# Patient Record
Sex: Male | Born: 1950
Health system: Southern US, Community
[De-identification: ages and names within clinical notes are randomized; demographics above are authoritative.]

## PROBLEM LIST (undated history)

## (undated) DIAGNOSIS — E785 Hyperlipidemia, unspecified: Secondary | ICD-10-CM

## (undated) DIAGNOSIS — T7840XA Allergy, unspecified, initial encounter: Secondary | ICD-10-CM

## (undated) DIAGNOSIS — I10 Essential (primary) hypertension: Secondary | ICD-10-CM

## (undated) DIAGNOSIS — C801 Malignant (primary) neoplasm, unspecified: Secondary | ICD-10-CM

## (undated) HISTORY — DX: Allergy, unspecified, initial encounter: T78.40XA

## (undated) HISTORY — DX: Hyperlipidemia, unspecified: E78.5

## (undated) HISTORY — DX: Malignant (primary) neoplasm, unspecified: C80.1

## (undated) HISTORY — PX: HERNIA REPAIR: SHX51

## (undated) HISTORY — DX: Essential (primary) hypertension: I10

## (undated) HISTORY — PX: TONSILLECTOMY: SUR1361

---
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
## 2009-03-12 DIAGNOSIS — C801 Malignant (primary) neoplasm, unspecified: Secondary | ICD-10-CM

## 2009-03-12 HISTORY — PX: MOHS SURGERY: SHX181

## 2009-03-12 HISTORY — DX: Malignant (primary) neoplasm, unspecified: C80.1

## 2012-07-21 ENCOUNTER — Ambulatory Visit (INDEPENDENT_AMBULATORY_CARE_PROVIDER_SITE_OTHER): Payer: Managed Care, Other (non HMO) | Admitting: Family

## 2012-07-21 ENCOUNTER — Encounter: Payer: Self-pay | Admitting: Family

## 2012-07-21 VITALS — BP 144/80 | HR 108 | Temp 97.9°F | Resp 16 | Ht 68.5 in | Wt 185.0 lb

## 2012-07-21 DIAGNOSIS — Z85820 Personal history of malignant melanoma of skin: Secondary | ICD-10-CM | POA: Insufficient documentation

## 2012-07-21 DIAGNOSIS — C439 Malignant melanoma of skin, unspecified: Secondary | ICD-10-CM

## 2012-07-21 DIAGNOSIS — B354 Tinea corporis: Secondary | ICD-10-CM

## 2012-07-21 MED ORDER — CLOTRIMAZOLE-BETAMETHASONE 1-0.05 % EX CREA: TOPICAL_CREAM | Freq: Two times a day (BID) | CUTANEOUS | Status: DC

## 2012-07-21 NOTE — Patient Instructions (Addendum)
You will be contacted about your referral to dermatology. Call if rash worsen of if not resolved in 2 weeks. Schedule a complete physical in 1 month. Come fasting to your visit.   Welcome to [facility]!

## 2012-07-21 NOTE — Assessment & Plan Note (Signed)
Trial of lotrisone cream.

## 2012-07-21 NOTE — Progress Notes (Signed)
  Subjective:    Patient ID: Jose Strong, male    DOB: [birth year], 62 y.o.   MRN: [id number]  HPI  Jose Strong is a 62 yr old male who presents today with chief complaint of dry, itchy patches on both feet x 2 months. Has history of ringworm in 2011. He has tried ?lamisil no significant improvement.  Was given ointment.  In the past.    Past medical history is significant for hx of melanoma in 2011- right neck. Was told "surface."   Moved from [place].     Review of Systems  Constitutional: Negative for unexpected weight change.  HENT: Negative for congestion.   Eyes: Negative for visual disturbance.  Respiratory: Negative for cough and shortness of breath.   Cardiovascular: Negative for chest pain.  Gastrointestinal: Negative for nausea, vomiting and diarrhea.  Genitourinary: Negative for frequency.  Musculoskeletal: Negative for myalgias and arthralgias.  Skin: Positive for rash.  Neurological: Negative for headaches.  Hematological: Negative for adenopathy.  Psychiatric/Behavioral:       Denies depression/anxiety   Past Medical History  Diagnosis Date  . Cancer 2011    melanoma --neck    History   Social History  . Marital Status: Married    Spouse Name: N/A    Number of Children: 0  . Years of Education: N/A   Occupational History  . Not on file.   Social History Main Topics  . Smoking status: Former Games developer  . Smokeless tobacco: Never Used  . Alcohol Use: 8.4 oz/week    14 Cans of beer per week  . Drug Use: Not on file  . Sexually Active: Not on file   Other Topics Concern  . Not on file   Social History Narrative   Married   Completed HS   Not working   Has worked in Therapist, 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      Past Surgical History  Procedure Laterality Date  . Mohs surgery  2011  . Tonsillectomy  1964    Family History  Problem Relation Age of Onset  . COPD Mother   . Angina  Mother     No Known Allergies  No current outpatient prescriptions on file prior to visit.   No current facility-administered medications on file prior to visit.    BP 144/80  Pulse 108  Temp(Src) 97.9 F (36.6 C) (Oral)  Resp 16  Ht 5' 8.5" (1.74 m)  Wt 185 lb (83.915 kg)  BMI 27.72 kg/m2  SpO2 98%        Objective:   Physical Exam  Constitutional: He is oriented to person, place, and time. He appears well-developed and well-nourished. No distress.  HENT:  Head: Normocephalic.  Cardiovascular: Normal rate and regular rhythm.   No murmur heard. Pulmonary/Chest: Effort normal and breath sounds normal. No respiratory distress. He has no wheezes. He has no rales. He exhibits no tenderness.  Musculoskeletal:  Annular, erythematous patches noted bilateral LE above ankles  Neurological: He is alert and oriented to person, place, and time.  Psychiatric: He has a normal mood and affect. His behavior is normal. Judgment and thought content normal.          Assessment & Plan:

## 2012-07-21 NOTE — Assessment & Plan Note (Signed)
Will refer to local derm for surveillance.

## 2012-07-23 ENCOUNTER — Ambulatory Visit: Payer: Self-pay | Admitting: Family

## 2012-08-05 ENCOUNTER — Telehealth: Payer: Self-pay | Admitting: *Deleted

## 2012-08-05 NOTE — Telephone Encounter (Signed)
Recommend benadryl, hydrocortisone.  If no improvement with these measures he should be seen in office please for visit.

## 2012-08-05 NOTE — Telephone Encounter (Signed)
Received message from pt that he worked in the yard over the weekend and thinks he may have poison oak / ivy on his hands and arms. Caladryl has no been helping. Wants to know if we will prescribe something to clear it up?  Please advise.

## 2012-08-06 NOTE — Telephone Encounter (Signed)
Notified pt re: instructions below and he voices understanding. Also advised pt of OTC (tec nu) spray for poison oak/ivy to try (ok per verbal from Provider).

## 2012-08-12 ENCOUNTER — Telehealth: Payer: Self-pay | Admitting: *Deleted

## 2012-08-12 ENCOUNTER — Encounter: Payer: Self-pay | Admitting: *Deleted

## 2012-08-12 ENCOUNTER — Emergency Department
Admission: EM | Admit: 2012-08-12 | Discharge: 2012-08-12 | Disposition: A | Discharge status: Home / Self Care | Payer: Managed Care, Other (non HMO) | Source: Home / Self Care | Attending: Family Medicine | Admitting: Family Medicine

## 2012-08-12 DIAGNOSIS — L255 Unspecified contact dermatitis due to plants, except food: Secondary | ICD-10-CM

## 2012-08-12 MED ORDER — TRIAMCINOLONE ACETONIDE 40 MG/ML IJ SUSP
40.0000 mg | Freq: Once | INTRAMUSCULAR | Status: AC
  Administered 2012-08-12: 40 mg via INTRAMUSCULAR

## 2012-08-12 MED ORDER — MUPIROCIN CALCIUM 2 % EX CREA: TOPICAL_CREAM | Freq: Three times a day (TID) | CUTANEOUS | Status: DC

## 2012-08-12 NOTE — Telephone Encounter (Signed)
  [facility] Triage Call Report Triage Record Num: [id number] Operator: Geanie Berlin Patient Name: Jose Strong Call Date & Time: 08/11/2012 5:26:38PM Patient Phone: [phone] PCP: Sandford Craze, NP Patient Gender: Male PCP Fax : [phone] Patient DOB: 1950/07/24 Practice Name: [facility] Reason for Call: Caller: Rena/Patient; PCP: Peggyann Juba, Efraim Kaufmann (Adults only); CB#: [phone]; Call regarding Rash/Hives; Reports spreading poison ivy that began on right hand, left arm and left ankle. Was burning weeds in yard and doing yard work. Rash has spread to buttocks and right groin. Tried oatmeal paste, Chlorax bleach, Nutec cream with some improvement to rash on right hand. Reported redness and warmth of rash. Advised to see [facility] within 4 hours for signs of secondary infection per Poison [place], [place] or [place] Exposure guideline. Stated would go to UC in the morning 08/12/12. Protocol(s) Used: Poison Ivy, [place], or [facility] Exposure Recommended Outcome per Protocol: See Provider within 4 hours Reason for Outcome: Signs of secondary infection Care Advice: Apply cool compresses to affected area(s) for 20 minutes 4 to 6 times daily to help relieve itching and provide a topical anesthesia. ~ ~ Call provider if symptoms worsen or new symptoms develop. ~ SYMPTOM / CONDITION MANAGEMENT Itching Relief: - Avoid scratching or rubbing irritated area; may cause further irritation and secondary infection - Take cool showers or baths several times a day to relieve itching; do not use soap - If cool water alone does not relieve itching, try adding 1/2 to 1 cup baking soda to bath water - Follow with application of a bland lotion such as calamine (do not apply to the eyes or genitals). ~ For symptom relief, consider nonprescription antihistamines (such as Allerest, Allegra, Claritin, Zyrtec, Chlor-Trimetron, Benadryl, etc.) as directed on label or by pharmacist.  Drowsiness may result, especially in geriatric patients. Non-sedating antihistamines are available without a prescription.

## 2012-08-12 NOTE — ED Notes (Signed)
Jose Strong c/o poison ivy rash on arms, leg and neck x 1 1/2 weeks. Used otc without relief.

## 2012-08-12 NOTE — ED Provider Notes (Signed)
History     CSN: [id number]  Arrival date & time 08/12/12  4098   First MD Initiated Contact with Patient 08/12/12 0900      Chief Complaint  Patient presents with  . Poison Ivy      HPI Comments: Patient reports that after he had worked in his yard ten days ago, he developed a pruritic rash on both arms/hands, neck, and some scattered involvement on lower legs.  The lesions have persisted.  He notes a lesion on his left medial ankle that remains erythematous and has continued to drain some fluid.  No fevers, chills, and sweats.  He feels well otherwise.  Patient is a 62 y.o. male presenting with rash. The history is provided by the patient.  Rash Pain location:  Generalized Pain quality comment:  Itching Pain severity:  Mild Onset quality:  Gradual Duration:  10 days Timing:  Constant Progression:  Unchanged Chronicity:  New Context comment:  Yard work and plant contact Relieved by:  Nothing Worsened by:  Nothing tried Associated symptoms: no chills, no fatigue, no fever, no nausea and no sore throat     Past Medical History  Diagnosis Date  . Cancer 2011    melanoma --neck    Past Surgical History  Procedure Laterality Date  . Mohs surgery  2011  . Tonsillectomy  1964  . Tonsillectomy      Family History  Problem Relation Age of Onset  . COPD Mother   . Angina Mother     History  Substance Use Topics  . Smoking status: Former Games developer  . Smokeless tobacco: Never Used  . Alcohol Use: 8.4 oz/week    14 Cans of beer per week      Review of Systems  Constitutional: Negative for fever, chills and fatigue.  HENT: Negative for sore throat.   Gastrointestinal: Negative for nausea.  Skin: Positive for rash.  All other systems reviewed and are negative.    Allergies  Review of patient's allergies indicates no known allergies.  Home Medications   Current Outpatient Rx  Name  Route  Sig  Dispense  Refill  . clotrimazole-betamethasone (LOTRISONE) cream  Topical   Apply topically 2 (two) times daily.   30 g   1   . mupirocin cream (BACTROBAN) 2 %   Topical   Apply topically 3 (three) times daily.   15 g   0     BP 133/91  Pulse 60  Temp(Src) 98.1 F (36.7 C) (Oral)  Resp 14  Ht 5' 8.5" (1.74 m)  Wt 182 lb (82.555 kg)  BMI 27.27 kg/m2  SpO2 99%  Physical Exam  Constitutional: He is oriented to person, place, and time. He appears well-developed and well-nourished. No distress.  HENT:  Head: Atraumatic.  Mouth/Throat: Oropharynx is clear and moist.  Eyes: Conjunctivae are normal. Pupils are equal, round, and reactive to light.  Cardiovascular: Normal heart sounds.   Pulmonary/Chest: Breath sounds normal.  Musculoskeletal: He exhibits no edema.       Feet:  There is an erythematous patch left medial ankle 2cm by 4cm suggestive of a superficial cellulitis  Lymphadenopathy:    He has no cervical adenopathy.  Neurological: He is alert and oriented to person, place, and time.  Skin: Skin is warm and dry. Rash noted.     There is a macular, slightly raised erythematous eruption on areas noted.  No vesicles, swelling, warmth, or tenderness present.    ED Course  Procedures  none  1. Contact dermatitis due to plant.  Possible early cellulitis lesion left ankle.       MDM  Kenalog 40mg  IM.  Begin Bactroban cream to lesion left ankle. May take Benadryl 50mg , every 6 hours as needed for itching. Call if not improving in 48 hours:  Would then add prednisone burst. Followup with dermatologist if not improving one week.        Lattie Haw, MD 08/12/12 1007

## 2012-12-08 ENCOUNTER — Ambulatory Visit (INDEPENDENT_AMBULATORY_CARE_PROVIDER_SITE_OTHER): Payer: Managed Care, Other (non HMO) | Admitting: Family

## 2012-12-08 ENCOUNTER — Other Ambulatory Visit: Payer: Self-pay | Admitting: Family

## 2012-12-08 ENCOUNTER — Encounter: Payer: Self-pay | Admitting: Family

## 2012-12-08 VITALS — BP 110/78 | HR 77 | Temp 97.8°F | Resp 16 | Ht 69.0 in | Wt 183.0 lb

## 2012-12-08 DIAGNOSIS — Z0000 Encounter for general adult medical examination without abnormal findings: Secondary | ICD-10-CM | POA: Insufficient documentation

## 2012-12-08 DIAGNOSIS — R9431 Abnormal electrocardiogram [ECG] [EKG]: Secondary | ICD-10-CM | POA: Insufficient documentation

## 2012-12-08 DIAGNOSIS — Z23 Encounter for immunization: Secondary | ICD-10-CM

## 2012-12-08 LAB — CBC WITH DIFFERENTIAL/PLATELET
Basophils Absolute: 0 10*3/uL (ref 0.0–0.1)
Basophils Relative: 0 % (ref 0–1)
Eosinophils Relative: 2 % (ref 0–5)
HCT: 45.2 % (ref 39.0–52.0)
Hemoglobin: 16 g/dL (ref 13.0–17.0)
MCH: 30.7 pg (ref 26.0–34.0)
MCHC: 35.4 g/dL (ref 30.0–36.0)
MCV: 86.8 fL (ref 78.0–100.0)
Monocytes Absolute: 0.7 10*3/uL (ref 0.1–1.0)
Monocytes Relative: 7 % (ref 3–12)
RDW: 13.5 % (ref 11.5–15.5)

## 2012-12-08 LAB — LIPID PANEL
Cholesterol: 253 mg/dL — ABNORMAL HIGH (ref 0–200)
HDL: 44 mg/dL (ref >39)
Total CHOL/HDL Ratio: 5.8 Ratio
Triglycerides: 196 mg/dL — ABNORMAL HIGH (ref <150)

## 2012-12-08 LAB — HEPATIC FUNCTION PANEL
Albumin: 4.5 g/dL (ref 3.5–5.2)
Alkaline Phosphatase: 58 U/L (ref 39–117)
Total Bilirubin: 0.7 mg/dL (ref 0.3–1.2)
Total Protein: 6.6 g/dL (ref 6.0–8.3)

## 2012-12-08 LAB — BASIC METABOLIC PANEL WITH GFR
Calcium: 9.7 mg/dL (ref 8.4–10.5)
Chloride: 106 mEq/L (ref 96–112)
Creat: 0.76 mg/dL (ref 0.50–1.35)
GFR, Est African American: >89 mL/min
GFR, Est Non African American: >89 mL/min

## 2012-12-08 NOTE — Patient Instructions (Addendum)
Please complete lab work prior to leaving.  Try to work on getting regular exercise such as walking. Follow up in 1 year, sooner if problems/concerns.

## 2012-12-08 NOTE — Assessment & Plan Note (Signed)
EKG questions old infarct.  No old EKG on file.  Will refer for stress test.

## 2012-12-08 NOTE — Assessment & Plan Note (Addendum)
Advised pt on importance or regular exercise.  Obtain fasting lab work including PSA.  Discussed pros and cons of PSA testing.  Declines colo due to cost.  Flu shot today. Discussed zostavax- he is requesting titers first as he denies hx of chicken pox as a child.

## 2012-12-08 NOTE — Progress Notes (Signed)
Subjective:    Patient ID: Jose Strong, male    DOB: January 23, 1951, 62 y.o.   MRN: [id number]  HPI  Jose Strong is a 62 yr old male who presents today for cpx.  Patient presents today for complete physical. He tells me that he is still seeking work and has been out of work x 2 years.  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 She continues to work.   Immunizations: Due for flu shot- he is agreeable to proceed.  Tetanus up to date.   Colo-  Completed ifob through insurance. Declines colo at this time due to finance.  Diet: reports healthy diet Exercise: not exercising due to back injury 6 weeks ago.  Reports that this is improving.  Saw derm reports that he had good check up.     Review of Systems  Constitutional: Negative for unexpected weight change.  HENT: Negative for congestion.   Eyes: Negative for visual disturbance.  Respiratory: Negative for cough and shortness of breath.   Cardiovascular: Negative for chest pain and leg swelling.  Gastrointestinal: Negative for nausea, vomiting and diarrhea.  Genitourinary: Negative for dysuria and frequency.  Musculoskeletal: Positive for back pain.  Skin:       Discoloration above the right ankle.    Past Medical History  Diagnosis Date  . Cancer 2011    melanoma --neck    History   Social History  . Marital Status: Married    Spouse Name: N/A    Number of Children: 0  . Years of Education: N/A   Occupational History  . Not on file.   Social History Main Topics  . Smoking status: Former Games developer  . Smokeless tobacco: Never Used  . Alcohol Use: 8.4 oz/week    14 Cans of beer per week  . Drug Use: No  . Sexual Activity: Not on file   Other Topics Concern  . Not on file   Social History Narrative   Married   Completed HS   Not working   Has worked in Therapist, 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      Past Surgical History   Procedure Laterality Date  . Mohs surgery  2011  . Tonsillectomy  1964  . Tonsillectomy      Family History  Problem Relation Age of Onset  . COPD Mother   . Angina Mother     No Known Allergies  No current outpatient prescriptions on file prior to visit.   No current facility-administered medications on file prior to visit.    BP 110/78  Pulse 77  Temp(Src) 97.8 F (36.6 C) (Oral)  Resp 16  Ht 5\' 9"  (1.753 m)  Wt 183 lb (83.008 kg)  BMI 27.01 kg/m2  SpO2 99%       Objective:   Physical Exam  Physical Exam  Constitutional: He is oriented to person, place, and time. He appears well-developed and well-nourished. No distress.  HENT:  Head: Normocephalic and atraumatic.  Right Ear: Tympanic membrane and ear canal normal.  Left Ear: Tympanic membrane and ear canal normal.  Mouth/Throat: Oropharynx is clear and moist.  Eyes: Pupils are equal, round, and reactive to light. No scleral icterus.  Neck: Normal range of motion. No thyromegaly present.  Cardiovascular: Normal rate and regular rhythm.   No murmur heard. Pulmonary/Chest: Effort normal and breath sounds normal. No respiratory distress. He  has no wheezes. He has no rales. He exhibits no tenderness.  Abdominal: Soft. Bowel sounds are normal. He exhibits no distension and no mass. There is no tenderness. There is no rebound and no guarding.  Musculoskeletal: He exhibits no edema.  Lymphadenopathy:    He has no cervical adenopathy.  Neurological: He is alert and oriented to person, place, and time. He has normal reflexes. He exhibits normal muscle tone. Coordination normal.  Skin: Skin is warm and dry.  hyperpigmented patch above right medial ankle.  Approx 1 inch in diameter Psychiatric: He has a normal mood and affect. His behavior is normal. Judgment and thought content normal.          Assessment & Plan:         Assessment & Plan:

## 2012-12-09 LAB — URINALYSIS, ROUTINE W REFLEX MICROSCOPIC
Bilirubin Urine: NEGATIVE
Glucose, UA: NEGATIVE mg/dL
Ketones, ur: NEGATIVE mg/dL
Protein, ur: NEGATIVE mg/dL
Specific Gravity, Urine: 1.016 (ref 1.005–1.030)
Urobilinogen, UA: 0.2 mg/dL (ref 0.0–1.0)

## 2012-12-09 LAB — VARICELLA ZOSTER ANTIBODY, IGG: Varicella IgG: 3145 Index — ABNORMAL HIGH (ref <135.00)

## 2012-12-09 LAB — PSA, TOTAL AND FREE: PSA, Free Pct: 17 % — ABNORMAL LOW (ref >25)

## 2012-12-10 ENCOUNTER — Encounter: Payer: Self-pay | Admitting: Family

## 2012-12-10 ENCOUNTER — Telehealth: Payer: Self-pay

## 2012-12-10 DIAGNOSIS — E785 Hyperlipidemia, unspecified: Secondary | ICD-10-CM

## 2012-12-10 NOTE — Telephone Encounter (Signed)
Patient notified of lab results. He asked that a copy be mailed to him. Thanks.

## 2012-12-11 NOTE — Telephone Encounter (Signed)
Lab order placed per 12/10/12 lab note and results mailed to pt.

## 2012-12-29 NOTE — Addendum Note (Signed)
Addended by: Sandford Craze on: 12/29/2012 12:07 PM   Modules accepted: Orders

## 2013-02-10 ENCOUNTER — Encounter: Payer: Managed Care, Other (non HMO) | Admitting: Physician Assistant

## 2013-04-27 ENCOUNTER — Ambulatory Visit (INDEPENDENT_AMBULATORY_CARE_PROVIDER_SITE_OTHER): Payer: Managed Care, Other (non HMO) | Admitting: Physician Assistant

## 2013-04-27 ENCOUNTER — Encounter: Payer: Self-pay | Admitting: Physician Assistant

## 2013-04-27 VITALS — BP 122/75 | HR 122 | Temp 100.1°F | Resp 20 | Ht 69.0 in | Wt 182.2 lb

## 2013-04-27 DIAGNOSIS — R52 Pain, unspecified: Secondary | ICD-10-CM

## 2013-04-27 DIAGNOSIS — B9789 Other viral agents as the cause of diseases classified elsewhere: Secondary | ICD-10-CM

## 2013-04-27 DIAGNOSIS — R5383 Other fatigue: Secondary | ICD-10-CM

## 2013-04-27 DIAGNOSIS — R509 Fever, unspecified: Secondary | ICD-10-CM

## 2013-04-27 DIAGNOSIS — J069 Acute upper respiratory infection, unspecified: Secondary | ICD-10-CM

## 2013-04-27 DIAGNOSIS — R5381 Other malaise: Secondary | ICD-10-CM

## 2013-04-27 DIAGNOSIS — H6690 Otitis media, unspecified, unspecified ear: Secondary | ICD-10-CM

## 2013-04-27 DIAGNOSIS — R6889 Other general symptoms and signs: Secondary | ICD-10-CM | POA: Insufficient documentation

## 2013-04-27 LAB — POCT INFLUENZA A/B
Influenza A, POC: NEGATIVE
Influenza B, POC: NEGATIVE

## 2013-04-27 MED ORDER — HYDROCOD POLST-CHLORPHEN POLST 10-8 MG/5ML PO LQCR: 5.0000 mL | Freq: Two times a day (BID) | ORAL | Status: DC | PRN

## 2013-04-27 MED ORDER — OSELTAMIVIR PHOSPHATE 75 MG PO CAPS: 75.0000 mg | ORAL_CAPSULE | Freq: Two times a day (BID) | ORAL | Status: DC

## 2013-04-27 MED ORDER — AMOXICILLIN 875 MG PO TABS: 875.0000 mg | ORAL_TABLET | Freq: Two times a day (BID) | ORAL | Status: DC

## 2013-04-27 NOTE — Assessment & Plan Note (Signed)
POC flu swab negative.  However, patient's symptoms classically fit influenza. Also, patient's wife is immunocompromised and influenza would be possibly fatal to her. Will Rx Tamiflu.  Increase fluid intake.  Rest.  Saline nasal spray.  Tylenol for fever and aches.  Rx Tussionex for cough.  Humidifier in bedroom.  Discussed appropriate hygiene measures to take to reduce spread of infection. Avoid close contact with sick wife.

## 2013-04-27 NOTE — Progress Notes (Signed)
Patient presents to clinic today c/o abrupt onset of sinus pressure, head congestion, chest congestion, scratchy throat, fever, chills and aches 1.5 days ago.  Patient denies shortness of breath or wheezing.  Does endorse a very mild, dry cough.  Denies recent travel or sick contact.  Patient endorses having his flu shot.  Denies known exposure to the flu.  Patient is concerned as he lives at home with his wife who is very sick with cancer and he does not wish to give her a virus that may be fatal to her.  Past Medical History  Diagnosis Date  . Cancer 2011    melanoma --neck    No current outpatient prescriptions on file prior to visit.   No current facility-administered medications on file prior to visit.    No Known Allergies  Family History  Problem Relation Age of Onset  . COPD Mother   . Angina Mother     History   Social History  . Marital Status: Married    Spouse Name: N/A    Number of Children: 0  . Years of Education: N/A   Social History Main Topics  . Smoking status: Former Research scientist (life sciences)  . Smokeless tobacco: Never Used  . Alcohol Use: 8.4 oz/week    14 Cans of beer per week  . Drug Use: No  . Sexual Activity: None   Other Topics Concern  . 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     He does not have children   Enjoys fishing/golf, recently caring for his wife.      Review of Systems - See HPI.  All other ROS are negative.  BP 122/75  Pulse 122  Temp(Src) 100.1 F (37.8 C) (Oral)  Resp 20  Ht 5\' 9"  (1.753 m)  Wt 182 lb 4 oz (82.668 kg)  BMI 26.90 kg/m2  SpO2 97%  Physical Exam  Vitals reviewed. Constitutional: He is oriented to person, place, and time and well-developed, well-nourished, and in no distress.  HENT:  Head: Normocephalic and atraumatic.  Right Ear: External ear normal.  Left Ear: External ear normal.  Nose: Nose normal.  Mouth/Throat: Oropharynx is clear and  moist. No oropharyngeal exudate.  No tenderness to percussion of sinuses on examination.  L TM within normal limits.  R TM erythematous and bulging.    Eyes: Conjunctivae are normal. Pupils are equal, round, and reactive to light.  Neck: Neck supple.  Cardiovascular: Regular rhythm, normal heart sounds and intact distal pulses.   No murmur heard. Slightly tachycardic.  Pulmonary/Chest: Effort normal and breath sounds normal. No respiratory distress. He has no wheezes. He has no rales. He exhibits no tenderness.  Lymphadenopathy:    He has no cervical adenopathy.  Neurological: He is alert and oriented to person, place, and time.  Skin: Skin is warm and dry. No rash noted.  Psychiatric: Affect normal.    Recent Results (from the past 2160 hour(s))  POCT INFLUENZA A/B     Status: None   Collection Time    04/27/13 11:36 AM      Result Value Ref Range   Influenza A, POC Negative     Influenza B, POC Negative      Assessment/Plan: Flu-like symptoms POC flu swab negative.  However, patient's symptoms classically fit influenza. Also, patient's wife is immunocompromised and influenza would be possibly fatal to her. Will Rx Tamiflu.  Increase  fluid intake.  Rest.  Saline nasal spray.  Tylenol for fever and aches.  Rx Tussionex for cough.  Humidifier in bedroom.  Discussed appropriate hygiene measures to take to reduce spread of infection. Avoid close contact with sick wife.  Otitis media Rx Amoxicillin.

## 2013-04-27 NOTE — Assessment & Plan Note (Signed)
Rx: Amoxicillin. ?

## 2013-04-27 NOTE — Patient Instructions (Signed)
Please take antibiotic as prescribed with food for your ear infection.  Increase fluid intake.  Rest.  Use saline nasal spray.  STOP SUDAFED.  Use plain mucinex.  Use Tussionex as directed for cough. Take Tylenol for fever and aches.  Place a humidifier in the bedroom.  Please return to clinic if symptoms are not improving over the next couple of days.  Take a zinc supplement or multivitamin to help boost your immune system.

## 2013-04-27 NOTE — Progress Notes (Signed)
Pre visit review using our clinic review tool, if applicable. No additional management support is needed unless otherwise documented below in the visit note/SLS  

## 2013-05-01 ENCOUNTER — Encounter: Payer: Self-pay | Admitting: *Deleted

## 2013-12-08 ENCOUNTER — Ambulatory Visit (INDEPENDENT_AMBULATORY_CARE_PROVIDER_SITE_OTHER): Payer: Managed Care, Other (non HMO)

## 2013-12-08 DIAGNOSIS — Z23 Encounter for immunization: Secondary | ICD-10-CM

## 2013-12-08 DIAGNOSIS — Z2911 Encounter for prophylactic immunotherapy for respiratory syncytial virus (RSV): Secondary | ICD-10-CM

## 2013-12-08 NOTE — Progress Notes (Signed)
Pt tolerated injections well.   No signs of reaction upon leaving the clinic.  

## 2013-12-08 NOTE — Progress Notes (Signed)
Pre visit review using our clinic review tool, if applicable. No additional management support is needed unless otherwise documented below in the visit note. 

## 2014-04-03 ENCOUNTER — Emergency Department (HOSPITAL_COMMUNITY)
Admission: EM | Admit: 2014-04-03 | Discharge: 2014-04-03 | Disposition: A | Discharge status: Home / Self Care | Payer: Managed Care, Other (non HMO) | Attending: Emergency Medicine | Admitting: Emergency Medicine

## 2014-04-03 ENCOUNTER — Emergency Department (HOSPITAL_COMMUNITY): Payer: Managed Care, Other (non HMO)

## 2014-04-03 ENCOUNTER — Encounter (HOSPITAL_COMMUNITY): Payer: Self-pay

## 2014-04-03 DIAGNOSIS — R0600 Dyspnea, unspecified: Secondary | ICD-10-CM | POA: Diagnosis not present

## 2014-04-03 DIAGNOSIS — Z792 Long term (current) use of antibiotics: Secondary | ICD-10-CM | POA: Insufficient documentation

## 2014-04-03 DIAGNOSIS — Z87891 Personal history of nicotine dependence: Secondary | ICD-10-CM | POA: Insufficient documentation

## 2014-04-03 DIAGNOSIS — R0609 Other forms of dyspnea: Secondary | ICD-10-CM | POA: Insufficient documentation

## 2014-04-03 DIAGNOSIS — M6281 Muscle weakness (generalized): Secondary | ICD-10-CM | POA: Insufficient documentation

## 2014-04-03 DIAGNOSIS — R0602 Shortness of breath: Secondary | ICD-10-CM | POA: Diagnosis not present

## 2014-04-03 DIAGNOSIS — Z79899 Other long term (current) drug therapy: Secondary | ICD-10-CM | POA: Insufficient documentation

## 2014-04-03 DIAGNOSIS — R9431 Abnormal electrocardiogram [ECG] [EKG]: Secondary | ICD-10-CM | POA: Diagnosis present

## 2014-04-03 DIAGNOSIS — R42 Dizziness and giddiness: Secondary | ICD-10-CM | POA: Insufficient documentation

## 2014-04-03 DIAGNOSIS — K047 Periapical abscess without sinus: Secondary | ICD-10-CM | POA: Diagnosis present

## 2014-04-03 DIAGNOSIS — Z85820 Personal history of malignant melanoma of skin: Secondary | ICD-10-CM | POA: Insufficient documentation

## 2014-04-03 LAB — HEPATIC FUNCTION PANEL
ALK PHOS: 52 U/L (ref 39–117)
ALT: 37 U/L (ref 0–53)
AST: 36 U/L (ref 0–37)
Albumin: 4.2 g/dL (ref 3.5–5.2)
BILIRUBIN DIRECT: 0.2 mg/dL (ref 0.0–0.5)
BILIRUBIN INDIRECT: 0.9 mg/dL (ref 0.3–0.9)
Total Bilirubin: 1.1 mg/dL (ref 0.3–1.2)
Total Protein: 6.8 g/dL (ref 6.0–8.3)

## 2014-04-03 LAB — CBC
HCT: 43.3 % (ref 39.0–52.0)
Hemoglobin: 15.7 g/dL (ref 13.0–17.0)
MCH: 31.3 pg (ref 26.0–34.0)
MCHC: 36.3 g/dL — ABNORMAL HIGH (ref 30.0–36.0)
MCV: 86.3 fL (ref 78.0–100.0)
PLATELETS: 218 10*3/uL (ref 150–400)
RBC: 5.02 MIL/uL (ref 4.22–5.81)
RDW: 12.5 % (ref 11.5–15.5)
WBC: 10.6 10*3/uL — AB (ref 4.0–10.5)

## 2014-04-03 LAB — BASIC METABOLIC PANEL
ANION GAP: 13 (ref 5–15)
BUN: 13 mg/dL (ref 6–23)
CHLORIDE: 103 mmol/L (ref 96–112)
CO2: 24 mmol/L (ref 19–32)
CREATININE: 0.85 mg/dL (ref 0.50–1.35)
Calcium: 9.6 mg/dL (ref 8.4–10.5)
GFR calc Af Amer: >90 mL/min (ref >90)
GFR calc non Af Amer: >90 mL/min (ref >90)
Glucose, Bld: 100 mg/dL — ABNORMAL HIGH (ref 70–99)
POTASSIUM: 3.5 mmol/L (ref 3.5–5.1)
Sodium: 140 mmol/L (ref 135–145)

## 2014-04-03 LAB — I-STAT TROPONIN, ED
TROPONIN I, POC: 0 ng/mL (ref 0.00–0.08)
Troponin i, poc: 0 ng/mL (ref 0.00–0.08)

## 2014-04-03 LAB — LIPASE, BLOOD: LIPASE: 34 U/L (ref 11–59)

## 2014-04-03 LAB — PROTIME-INR
INR: 1.03 (ref 0.00–1.49)
Prothrombin Time: 13.7 seconds (ref 11.6–15.2)

## 2014-04-03 LAB — D-DIMER, QUANTITATIVE (NOT AT ARMC): D DIMER QUANT: 1.44 ug{FEU}/mL — AB (ref 0.00–0.48)

## 2014-04-03 LAB — BRAIN NATRIURETIC PEPTIDE: B Natriuretic Peptide: 30.4 pg/mL (ref 0.0–100.0)

## 2014-04-03 LAB — APTT: APTT: 28 s (ref 24–37)

## 2014-04-03 MED ORDER — ASPIRIN 81 MG PO CHEW
324.0000 mg | CHEWABLE_TABLET | Freq: Once | ORAL | Status: AC
  Administered 2014-04-03: 324 mg via ORAL
  Filled 2014-04-03: qty 4

## 2014-04-03 MED ORDER — IOHEXOL 350 MG/ML SOLN
80.0000 mL | Freq: Once | INTRAVENOUS | Status: AC | PRN
  Administered 2014-04-03: 80 mL via INTRAVENOUS

## 2014-04-03 NOTE — ED Notes (Signed)
Patient transported to X-ray 

## 2014-04-03 NOTE — Consult Note (Addendum)
ADMISSION NOTE  Reason for Consult: Dyspnea  Requesting Physician: Dr. Johnney Killian  Cardiologist: None (NEW)  HPI: This is a 64 y.o. male with a past medical history significant for tobacco abuse (1/2 ppd x 20 years), quit in 1990's. He also reports some sporadic palpitations and has been shown to have PVC's in the past. Today, he was shoveling snow at [facility] and became acutely dyspneic. He felt dizzy and lightheaded all over. He endorsed upper midepigastric tenderness and focal, sharp left lateral chest pain over the lower ribs. This was somewhat worse with breathing. He also became significantly dyspneic walking up stairs. He denies any anginal symptoms. Recently he's been exercising more and has lost about 20 pounds in the last 2 years. He showed me a fitbit record, accomplishing between 10 and 15,000 steps a day. He did have an EKG when he established primary care at [facility] in 2014. This demonstrated very small inferior R waves but was interpreted as inferior infarct. He was apparently referred for stress test but this was denied by his insurance due to lack of cardiac risk factors. His EKG today is similar but also includes poor R-wave progression anteriorly. There are not diagnostic Q waves, rather very small R waves with a late transition V4. Initial laboratory work shows a mildly elevated white blood cell count of 10,600. He tells me that he started treatment with an antibiotic on Monday after being seen over the weekend for a dental infection. Initial troponin is 0. A d-dimer is elevated at 1.44. The rest of his laboratory work is unremarkable.   PMHx:  Past Medical History  Diagnosis Date  . Cancer 2011    melanoma --neck   Past Surgical History  Procedure Laterality Date  . Mohs surgery  2011  . Tonsillectomy  1964  . Tonsillectomy      FAMHx: Family History  Problem Relation Age of Onset  . COPD Mother   . Angina Mother     SOCHx:  reports that he has quit smoking. He  has never used smokeless tobacco. He reports that he drinks about 8.4 oz of alcohol per week. He reports that he does not use illicit drugs.  ALLERGIES: No Known Allergies  ROS: A comprehensive review of systems was negative except for: Respiratory: positive for dyspnea on exertion and pleurisy/chest pain Cardiovascular: positive for palpitations and varicose veins in left leg Gastrointestinal: positive for abdominal pain  HOME MEDICATIONS:   Medication List    ASK your doctor about these medications        amoxicillin 875 MG tablet  Commonly known as:  AMOXIL  Take 1 tablet (875 mg total) by mouth 2 (two) times daily.     chlorpheniramine-HYDROcodone 10-8 MG/5ML Lqcr  Commonly known as:  TUSSIONEX PENNKINETIC ER  Take 5 mLs by mouth every 12 (twelve) hours as needed.     clindamycin 300 MG capsule  Commonly known as:  CLEOCIN  Take 300 mg by mouth every 6 (six) hours.     multivitamin tablet  Take 1 tablet by mouth daily.     oseltamivir 75 MG capsule  Commonly known as:  TAMIFLU  Take 1 capsule (75 mg total) by mouth 2 (two) times daily.     pseudoephedrine 30 MG tablet  Commonly known as:  SUDAFED  Take 30 mg by mouth every 4 (four) hours as needed for congestion.       HOSPITAL MEDICATIONS: I have reviewed the patient's current medications.  VITALS: Blood pressure  125/83, pulse 86, temperature 97.9 F (36.6 C), temperature source Oral, resp. rate 17, SpO2 97 %.  PHYSICAL EXAM: General appearance: alert and no distress Neck: no carotid bruit and no JVD Lungs: clear to auscultation bilaterally Heart: regular rate and rhythm, S1, S2 normal, no murmur, click, rub or gallop Abdomen: soft, non-tender; bowel sounds normal; no masses,  no organomegaly Extremities: no edema, redness or tenderness in the calves or thighs, varicose veins noted and there is fullness to the superficial veins over the left medial malleolus Pulses: 2+ and symmetric Skin: Skin color,  texture, turgor normal. No rashes or lesions Neurologic: Grossly normal Psych: appears impatient, but pleasant, frustruated  LABS: Results for orders placed or performed during the hospital encounter of 04/03/14 (from the past 48 hour(s))  CBC     Status: Abnormal   Collection Time: 04/03/14  2:21 PM  Result Value Ref Range   WBC 10.6 (H) 4.0 - 10.5 K/uL   RBC 5.02 4.22 - 5.81 MIL/uL   Hemoglobin 15.7 13.0 - 17.0 g/dL   HCT 43.3 39.0 - 52.0 %   MCV 86.3 78.0 - 100.0 fL   MCH 31.3 26.0 - 34.0 pg   MCHC 36.3 (H) 30.0 - 36.0 g/dL   RDW 12.5 11.5 - 15.5 %   Platelets 218 150 - 400 K/uL  Basic metabolic panel     Status: Abnormal   Collection Time: 04/03/14  2:21 PM  Result Value Ref Range   Sodium 140 135 - 145 mmol/L   Potassium 3.5 3.5 - 5.1 mmol/L   Chloride 103 96 - 112 mmol/L   CO2 24 19 - 32 mmol/L   Glucose, Bld 100 (H) 70 - 99 mg/dL   BUN 13 6 - 23 mg/dL   Creatinine, Ser 0.85 0.50 - 1.35 mg/dL   Calcium 9.6 8.4 - 10.5 mg/dL   GFR calc non Af Amer >90 >90 mL/min   GFR calc Af Amer >90 >90 mL/min    Comment: (NOTE) The eGFR has been calculated using the CKD EPI equation. This calculation has not been validated in all clinical situations. eGFR's persistently <90 mL/min signify possible Chronic Kidney Disease.    Anion gap 13 5 - 15  APTT     Status: None   Collection Time: 04/03/14  2:21 PM  Result Value Ref Range   aPTT 28 24 - 37 seconds  Protime-INR     Status: None   Collection Time: 04/03/14  2:21 PM  Result Value Ref Range   Prothrombin Time 13.7 11.6 - 15.2 seconds   INR 1.03 0.00 - 1.49  Hepatic function panel     Status: None   Collection Time: 04/03/14  2:21 PM  Result Value Ref Range   Total Protein 6.8 6.0 - 8.3 g/dL   Albumin 4.2 3.5 - 5.2 g/dL   AST 36 0 - 37 U/L   ALT 37 0 - 53 U/L   Alkaline Phosphatase 52 39 - 117 U/L   Total Bilirubin 1.1 0.3 - 1.2 mg/dL   Bilirubin, Direct 0.2 0.0 - 0.5 mg/dL    Comment: Please note change in reference  range.   Indirect Bilirubin 0.9 0.3 - 0.9 mg/dL  Lipase, blood     Status: None   Collection Time: 04/03/14  2:21 PM  Result Value Ref Range   Lipase 34 11 - 59 U/L  D-dimer, quantitative     Status: Abnormal   Collection Time: 04/03/14  2:21 PM  Result Value Ref  Range   D-Dimer, Quant 1.44 (H) 0.00 - 0.48 ug/mL-FEU    Comment:        AT THE INHOUSE ESTABLISHED CUTOFF VALUE OF 0.48 ug/mL FEU, THIS ASSAY HAS BEEN DOCUMENTED IN THE LITERATURE TO HAVE A SENSITIVITY AND NEGATIVE PREDICTIVE VALUE OF AT LEAST 98 TO 99%.  THE TEST RESULT SHOULD BE CORRELATED WITH AN ASSESSMENT OF THE CLINICAL PROBABILITY OF DVT / VTE.   I-stat troponin, ED (not at [facility])     Status: None   Collection Time: 04/03/14  2:26 PM  Result Value Ref Range   Troponin i, poc 0.00 0.00 - 0.08 ng/mL   Comment 3            Comment: Due to the release kinetics of cTnI, a negative result within the first hours of the onset of symptoms does not rule out myocardial infarction with certainty. If myocardial infarction is still suspected, repeat the test at appropriate intervals.     IMAGING: Dg Chest 2 View  04/03/2014   CLINICAL DATA:  SOB, weakness, dizziness (pt complains after shoveling snow for 3 hrs)Hx: Non-smoker  EXAM: CHEST  2 VIEW  COMPARISON:  None.  FINDINGS: Cardiac silhouette normal in size and configuration. Aorta is tortuous. No mediastinal or hilar masses or evidence of adenopathy.  Mild prominence of the bronchovascular markings. No lung consolidation or edema. No pleural effusion or pneumothorax.  Bony thorax is unremarkable.  IMPRESSION: No active cardiopulmonary disease.   Electronically Signed   By: Lajean Manes M.D.   On: 04/03/2014 15:08    HOSPITAL DIAGNOSES: Principal Problem:   Acute dyspnea Active Problems:   Nonspecific abnormal electrocardiogram (ECG) (EKG)   Dental infection   IMPRESSION/RECOMMENDATION:  1. Acute dyspnea - EKG is unchanged compared to findings in 2014. He was  referred for stress testing at that time due to the abnormalities which could be consistent with prior infarct, however sometimes these could be normal variants. Initial troponin is negative. He has very few cardiac risk factors other than age and a remote history of tobacco use. He's had no recent symptoms with significant exertion, but was doing very heavy exertion today. Ischemia could be a cause of his acute dyspnea. I would recommend trending troponins and if negative consider stress testing in the am to further evaluate for ischemia and/or infarct.  Another possibility of his dyspnea is pulmonary embolus. The symptoms were acute onset and associated with a sharp, somewhat pleuritic chest pain in the left lower chest area. D-dimer is elevated at 1.44. This could be attributed to dental infection, however it's prudent to rule out pulmonary embolus. I would advise a CT pulmonary angiogram. If this is negative, then it would be reasonable to do further cardiac testing. 2. Dental infection - continue course of amoxicillin/clindamycin - Day 7/?10  Thanks for consulting [place]. He did indicate that he was not interested in admission - if his CT scan is negative and he is willing to stay for a stress test tomorrow, please contact the cardiology fellow overnight for admission orders.  Time Spent Directly with Patient: 30 minutes  Pixie Casino, MD, [facility] Attending Cardiologist [facility]  HILTY,Kenneth C 04/03/2014, 4:47 PM

## 2014-04-03 NOTE — H&P (Deleted)
ADMISSION NOTE  Reason for Consult: Dyspnea  Requesting Physician: Dr. Johnney Killian  Cardiologist: None (NEW)  HPI: This is a 64 y.o. male with a past medical history significant for tobacco abuse (1/2 ppd x 20 years), quit in 1990's. He also reports some sporadic palpitations and has been shown to have PVC's in the past. Today, he was shoveling snow at [facility] and became acutely dyspneic. He felt dizzy and lightheaded all over. He endorsed upper midepigastric tenderness and focal, sharp left lateral chest pain over the lower ribs. This was somewhat worse with breathing. He also became significantly dyspneic walking up stairs. He denies any anginal symptoms. Recently he's been exercising more and has lost about 20 pounds in the last 2 years. He showed me a fitbit record, accomplishing between 10 and 15,000 steps a day. He did have an EKG when he established primary care at [facility] in 2014. This demonstrated very small inferior R waves but was interpreted as inferior infarct. He was apparently referred for stress test but this was denied by his insurance due to lack of cardiac risk factors. His EKG today is similar but also includes poor R-wave progression anteriorly. There are not diagnostic Q waves, rather very small R waves with a late transition V4. Initial laboratory work shows a mildly elevated white blood cell count of 10,600. He tells me that he started treatment with an antibiotic on Monday after being seen over the weekend for a dental infection. Initial troponin is 0. A d-dimer is elevated at 1.44. The rest of his laboratory work is unremarkable.   PMHx:  Past Medical History  Diagnosis Date  . Cancer 2011    melanoma --neck   Past Surgical History  Procedure Laterality Date  . Mohs surgery  2011  . Tonsillectomy  1964  . Tonsillectomy      FAMHx: Family History  Problem Relation Age of Onset  . COPD Mother   . Angina Mother     SOCHx:  reports that he has quit smoking. He  has never used smokeless tobacco. He reports that he drinks about 8.4 oz of alcohol per week. He reports that he does not use illicit drugs.  ALLERGIES: No Known Allergies  ROS: A comprehensive review of systems was negative except for: Respiratory: positive for dyspnea on exertion and pleurisy/chest pain Cardiovascular: positive for palpitations and varicose veins in left leg Gastrointestinal: positive for abdominal pain  HOME MEDICATIONS:   Medication List    ASK your doctor about these medications        amoxicillin 875 MG tablet  Commonly known as:  AMOXIL  Take 1 tablet (875 mg total) by mouth 2 (two) times daily.     chlorpheniramine-HYDROcodone 10-8 MG/5ML Lqcr  Commonly known as:  TUSSIONEX PENNKINETIC ER  Take 5 mLs by mouth every 12 (twelve) hours as needed.     clindamycin 300 MG capsule  Commonly known as:  CLEOCIN  Take 300 mg by mouth every 6 (six) hours.     multivitamin tablet  Take 1 tablet by mouth daily.     oseltamivir 75 MG capsule  Commonly known as:  TAMIFLU  Take 1 capsule (75 mg total) by mouth 2 (two) times daily.     pseudoephedrine 30 MG tablet  Commonly known as:  SUDAFED  Take 30 mg by mouth every 4 (four) hours as needed for congestion.       HOSPITAL MEDICATIONS: I have reviewed the patient's current medications.  VITALS: Blood pressure  125/83, pulse 86, temperature 97.9 F (36.6 C), temperature source Oral, resp. rate 17, SpO2 97 %.  PHYSICAL EXAM: General appearance: alert and no distress Neck: no carotid bruit and no JVD Lungs: clear to auscultation bilaterally Heart: regular rate and rhythm, S1, S2 normal, no murmur, click, rub or gallop Abdomen: soft, non-tender; bowel sounds normal; no masses,  no organomegaly Extremities: no edema, redness or tenderness in the calves or thighs, varicose veins noted and there is fullness to the superficial veins over the left medial malleolus Pulses: 2+ and symmetric Skin: Skin color,  texture, turgor normal. No rashes or lesions Neurologic: Grossly normal Psych: appears impatient, but pleasant, frustruated  LABS: Results for orders placed or performed during the hospital encounter of 04/03/14 (from the past 48 hour(s))  CBC     Status: Abnormal   Collection Time: 04/03/14  2:21 PM  Result Value Ref Range   WBC 10.6 (H) 4.0 - 10.5 K/uL   RBC 5.02 4.22 - 5.81 MIL/uL   Hemoglobin 15.7 13.0 - 17.0 g/dL   HCT 43.3 39.0 - 52.0 %   MCV 86.3 78.0 - 100.0 fL   MCH 31.3 26.0 - 34.0 pg   MCHC 36.3 (H) 30.0 - 36.0 g/dL   RDW 12.5 11.5 - 15.5 %   Platelets 218 150 - 400 K/uL  Basic metabolic panel     Status: Abnormal   Collection Time: 04/03/14  2:21 PM  Result Value Ref Range   Sodium 140 135 - 145 mmol/L   Potassium 3.5 3.5 - 5.1 mmol/L   Chloride 103 96 - 112 mmol/L   CO2 24 19 - 32 mmol/L   Glucose, Bld 100 (H) 70 - 99 mg/dL   BUN 13 6 - 23 mg/dL   Creatinine, Ser 0.85 0.50 - 1.35 mg/dL   Calcium 9.6 8.4 - 10.5 mg/dL   GFR calc non Af Amer >90 >90 mL/min   GFR calc Af Amer >90 >90 mL/min    Comment: (NOTE) The eGFR has been calculated using the CKD EPI equation. This calculation has not been validated in all clinical situations. eGFR's persistently <90 mL/min signify possible Chronic Kidney Disease.    Anion gap 13 5 - 15  APTT     Status: None   Collection Time: 04/03/14  2:21 PM  Result Value Ref Range   aPTT 28 24 - 37 seconds  Protime-INR     Status: None   Collection Time: 04/03/14  2:21 PM  Result Value Ref Range   Prothrombin Time 13.7 11.6 - 15.2 seconds   INR 1.03 0.00 - 1.49  Hepatic function panel     Status: None   Collection Time: 04/03/14  2:21 PM  Result Value Ref Range   Total Protein 6.8 6.0 - 8.3 g/dL   Albumin 4.2 3.5 - 5.2 g/dL   AST 36 0 - 37 U/L   ALT 37 0 - 53 U/L   Alkaline Phosphatase 52 39 - 117 U/L   Total Bilirubin 1.1 0.3 - 1.2 mg/dL   Bilirubin, Direct 0.2 0.0 - 0.5 mg/dL    Comment: Please note change in reference  range.   Indirect Bilirubin 0.9 0.3 - 0.9 mg/dL  Lipase, blood     Status: None   Collection Time: 04/03/14  2:21 PM  Result Value Ref Range   Lipase 34 11 - 59 U/L  D-dimer, quantitative     Status: Abnormal   Collection Time: 04/03/14  2:21 PM  Result Value Ref  Range   D-Dimer, Quant 1.44 (H) 0.00 - 0.48 ug/mL-FEU    Comment:        AT THE INHOUSE ESTABLISHED CUTOFF VALUE OF 0.48 ug/mL FEU, THIS ASSAY HAS BEEN DOCUMENTED IN THE LITERATURE TO HAVE A SENSITIVITY AND NEGATIVE PREDICTIVE VALUE OF AT LEAST 98 TO 99%.  THE TEST RESULT SHOULD BE CORRELATED WITH AN ASSESSMENT OF THE CLINICAL PROBABILITY OF DVT / VTE.   I-stat troponin, ED (not at [facility])     Status: None   Collection Time: 04/03/14  2:26 PM  Result Value Ref Range   Troponin i, poc 0.00 0.00 - 0.08 ng/mL   Comment 3            Comment: Due to the release kinetics of cTnI, a negative result within the first hours of the onset of symptoms does not rule out myocardial infarction with certainty. If myocardial infarction is still suspected, repeat the test at appropriate intervals.     IMAGING: Dg Chest 2 View  04/03/2014   CLINICAL DATA:  SOB, weakness, dizziness (pt complains after shoveling snow for 3 hrs)Hx: Non-smoker  EXAM: CHEST  2 VIEW  COMPARISON:  None.  FINDINGS: Cardiac silhouette normal in size and configuration. Aorta is tortuous. No mediastinal or hilar masses or evidence of adenopathy.  Mild prominence of the bronchovascular markings. No lung consolidation or edema. No pleural effusion or pneumothorax.  Bony thorax is unremarkable.  IMPRESSION: No active cardiopulmonary disease.   Electronically Signed   By: Lajean Manes M.D.   On: 04/03/2014 15:08    HOSPITAL DIAGNOSES: Principal Problem:   Acute dyspnea Active Problems:   Nonspecific abnormal electrocardiogram (ECG) (EKG)   Dental infection   IMPRESSION/RECOMMENDATION:  1. Acute dyspnea - EKG is unchanged compared to findings in 2014. He was  referred for stress testing at that time due to the abnormalities which could be consistent with prior infarct, however sometimes these could be normal variants. Initial troponin is negative. He has very few cardiac risk factors other than age and a remote history of tobacco use. He's had no recent symptoms with significant exertion, but was doing very heavy exertion today. Ischemia could be a cause of his acute dyspnea. I would recommend trending troponins and if negative consider stress testing in the am to further evaluate for ischemia and/or infarct.  Another possibility of his dyspnea is pulmonary embolus. The symptoms were acute onset and associated with a sharp, somewhat pleuritic chest pain in the left lower chest area. D-dimer is elevated at 1.44. This could be attributed to dental infection, however it's prudent to rule out pulmonary embolus. I would advise a CT pulmonary angiogram. If this is negative, then it would be reasonable to do further cardiac testing. 2. Dental infection - continue course of amoxicillin/clindamycin - Day 7/?10  Thanks for consulting [place]. He did indicate that he was not interested in admission - will place orders, however, should he decline admission, I would recommend a second troponin- if negative, he could follow-up with me in the office for outpatient stress testing.  Time Spent Directly with Patient: 30 minutes  Pixie Casino, MD, [facility] Attending Cardiologist [facility]  Jafar Poffenberger C 04/03/2014, 4:47 PM

## 2014-04-03 NOTE — ED Provider Notes (Signed)
Patient care assumed from Dr. Johnney Killian at 4 PM. Please see her note for history of present illness and details of care until that point.  Briefly this is a Caucasian male with shortness of breath and tingling in his hands after shoveling snow today. Cardiology has been consulted and we are awaiting their recommendations. Patient has a positive d-dimer at 1.4 we are awaiting a CT PE study. Cardiology felt the patient was low risk for an MI or ACS with no risk factors. They recommended following up the results of the CT PE study with further cardiology workup if this is negative. They recommended either outpatient versus inpatient admission depending upon the patient's preference.  CT PE study was negative for PE. This was discussed with the patient who was offered inpatient admission. The patient did not want to stay in the hospital for stress test and stated that he would prefer to follow-up as an outpatient and would do so on Monday. Risks of outpatient workup were discussed with the patient he was informed that the safest route would be to proceed with admission and stress test in morning. Patient continued to refuse stating that he would return if he became short of breath or developed any chest pain.  As a result a delta troponin was obtained which was negative. Patient was instructed to follow-up with his primary care physician on Monday and to return to the emergency department with shortness of breath or chest pain. Patient was discharged in good condition.  His care was discussed with my attending Dr. Aline Brochure.  Katheren Shams, MD 04/03/14 [id number]  Pamella Pert, MD 04/03/14 2325

## 2014-04-03 NOTE — ED Notes (Signed)
Per [facility], pt works at [employer] and was shoveling snow and became tired and winded. Sat down and then was told to go to break. Went up stairs and couldn't catch his breath.. Had some tingling in bilateral hands, denies any cp. Denies any SOB or tingling in hands at this time. 20g to LAC, VSS. CBG 118.

## 2014-04-03 NOTE — ED Provider Notes (Signed)
CSN: [id number]     Arrival date & time 04/03/14  1359 History   First MD Initiated Contact with Patient 04/03/14 1409     Chief Complaint  Patient presents with  . Shortness of Breath     (Consider location/radiation/quality/duration/timing/severity/associated sxs/prior Treatment) HPI The patient was shoveling snow today and became very short of breath. He initially denied any chest pain in association with this, however with further discussion he did describe having had some "heartburn". He took a break and started to feel better but when he had to climb a set of stairs to go to his break he reports he became so short of breath he had to sit down. He reports during these episodes he felt fairly lightheaded and generally weak. He did not develop any syncopal episode. The patient reports that he is very active and normally can exert himself with no problems of chest pain or shortness of breath. He has no cardiac history. The patient reports that just recently he was started on amoxicillin for recent dental procedure but otherwise has been very well without cough, fever, exertional chest pain. He has not been experiencing any lower extremity swelling.  I have reviewed EMS 12-lead EKGs and did not see any acute elevation patterns. Sinus rhythm is present. EMS reports that upon their arrival the patient was having unifocal PVCs about 5/minute, for which he denied being symptomatic. They did describe the patient is being slightly pale in appearance upon arrival. Past Medical History  Diagnosis Date  . Cancer 2011    melanoma --neck   Past Surgical History  Procedure Laterality Date  . Mohs surgery  2011  . Tonsillectomy  1964  . Tonsillectomy     Family History  Problem Relation Age of Onset  . COPD Mother   . Angina Mother    History  Substance Use Topics  . Smoking status: Former Research scientist (life sciences)  . Smokeless tobacco: Never Used  . Alcohol Use: 8.4 oz/week    14 Cans of beer per week     Review of Systems  10 Systems reviewed and are negative for acute change except as noted in the HPI.   Allergies  Review of patient's allergies indicates no known allergies.  Home Medications   Prior to Admission medications   Medication Sig Start Date End Date Taking? Authorizing Provider  amoxicillin (AMOXIL) 875 MG tablet Take 1 tablet (875 mg total) by mouth 2 (two) times daily. 04/27/13   Brunetta Jeans, PA-C  chlorpheniramine-HYDROcodone [facility]) 10-8 MG/5ML LQCR Take 5 mLs by mouth every 12 (twelve) hours as needed. 04/27/13   Brunetta Jeans, PA-C  Multiple Vitamin (MULTIVITAMIN) tablet Take 1 tablet by mouth daily.    Historical Provider, MD  oseltamivir (TAMIFLU) 75 MG capsule Take 1 capsule (75 mg total) by mouth 2 (two) times daily. 04/27/13   Brunetta Jeans, PA-C  pseudoephedrine (SUDAFED) 30 MG tablet Take 30 mg by mouth every 4 (four) hours as needed for congestion.    Historical Provider, MD   BP 147/101 mmHg  Pulse 84  Temp(Src) 97.9 F (36.6 C) (Oral)  Resp 15  SpO2 100% Physical Exam  Constitutional: He is oriented to person, place, and time. He appears well-developed and well-nourished.  HENT:  Head: Normocephalic and atraumatic.  Eyes: EOM are normal. Pupils are equal, round, and reactive to light.  Neck: Neck supple.  Cardiovascular: Normal rate, regular rhythm, normal heart sounds and intact distal pulses.   Pulmonary/Chest: Effort normal  and breath sounds normal.  Abdominal: Soft. Bowel sounds are normal. He exhibits no distension. There is no tenderness.  Musculoskeletal: Normal range of motion. He exhibits no edema.  Neurological: He is alert and oriented to person, place, and time. He has normal strength. Coordination normal. GCS eye subscore is 4. GCS verbal subscore is 5. GCS motor subscore is 6.  Skin: Skin is warm, dry and intact.  Psychiatric: He has a normal mood and affect.    ED Course  Procedures (including critical  care time) Labs Review Labs Reviewed  CBC  BASIC METABOLIC PANEL  APTT  PROTIME-INR  HEPATIC FUNCTION PANEL  LIPASE, BLOOD  I-STAT TROPOININ, ED    Imaging Review No results found.   EKG Interpretation None     Cardiology consulted15:55. Will assess in the emergency department. MDM   Final diagnoses:  SOB (shortness of breath)   Final care is turned over to Dr. Rosiland Oz and Dr. Aline Brochure. I have spoken with cardiology and they are interviewing the patient in the emergency department. Per cardiology recommendation a d-dimer was obtained and positive. I ordered a CT PE study. The patient has remained stable without chest pain. At this point final disposition will be pending results of CT study and cardiology recommendation.  Charlesetta Shanks, MD 04/04/14 1034

## 2014-04-03 NOTE — Discharge Instructions (Signed)

## 2014-04-05 ENCOUNTER — Ambulatory Visit (INDEPENDENT_AMBULATORY_CARE_PROVIDER_SITE_OTHER): Payer: Managed Care, Other (non HMO) | Admitting: Family

## 2014-04-05 ENCOUNTER — Encounter: Payer: Self-pay | Admitting: Family

## 2014-04-05 ENCOUNTER — Telehealth: Payer: Self-pay | Admitting: Internal Medicine

## 2014-04-05 ENCOUNTER — Telehealth: Payer: Self-pay | Admitting: *Deleted

## 2014-04-05 ENCOUNTER — Telehealth: Payer: Self-pay | Admitting: Family

## 2014-04-05 VITALS — BP 114/70 | HR 75 | Temp 98.0°F | Resp 16 | Ht 69.0 in | Wt 171.0 lb

## 2014-04-05 DIAGNOSIS — R0600 Dyspnea, unspecified: Secondary | ICD-10-CM

## 2014-04-05 DIAGNOSIS — R079 Chest pain, unspecified: Secondary | ICD-10-CM

## 2014-04-05 NOTE — Telephone Encounter (Signed)
Caller name: Waunita Schooner Relation to pt: self Call back number: [phone] Pharmacy:  Reason for call: Pt needs note for work stating he can return to work and what instructions (no lifting or whatever applies).

## 2014-04-05 NOTE — Progress Notes (Signed)
Pre visit review using our clinic review tool, if applicable. No additional management support is needed unless otherwise documented below in the visit note. 

## 2014-04-05 NOTE — Assessment & Plan Note (Addendum)
New.  Further cardiac work up is recommended. Stress test to be scheduled by cardiology. Pt advised to return to the ED if recurrent SOB. He verbalizes understanding.

## 2014-04-05 NOTE — Telephone Encounter (Signed)
Pt seen today

## 2014-04-05 NOTE — Telephone Encounter (Signed)
GXT ordered per Dr. Debara Pickett after message was received from PCP that patient needed stress test (was in ED and did not want to have test in hospital) - patient will need follow up after - Deedie, scheduler, notified and will arrange for patient (he called in and inquired about this test being ordered/scheduled)

## 2014-04-05 NOTE — Patient Instructions (Signed)
Dr. Lysbeth Penner office will contact you to arrange Stress test. Go to ER if you develop recurrent severe shortness of breath or chest pain.

## 2014-04-05 NOTE — Telephone Encounter (Signed)
Please contact pt and arrange ED follow up today with me or another provider in our office if I do not have openings.

## 2014-04-05 NOTE — Progress Notes (Signed)
Subjective:    Patient ID: Jose Strong, male    DOB: May 24, 1950, 64 y.o.   MRN: [id number]  HPI Mr. Marasigan is here today for follow up for ED visit on 04/03/2014 for dyspnea that developed after shoveling snow. ED records are reviewed. His EKG was unchanged compared to findings from 2014. CXR showed NAD. D-dimer was high at 1.44 and initial troponin was negative. CT angio was negative. They wanted to keep him overnight at [facility] and do stress test on Sunday. He refused admission at that time due to cost.    He denies current dyspnea.  Denies current or previous chest pain.   Review of Systems  Respiratory: Negative for shortness of breath.   Cardiovascular: Negative for chest pain and palpitations.   Past Medical History  Diagnosis Date  . Cancer 2011    melanoma --neck    History   Social History  . Marital Status: Married    Spouse Name: N/A    Number of Children: 0  . Years of Education: N/A   Occupational History  . Not on file.   Social History Main Topics  . Smoking status: Former Research scientist (life sciences)  . Smokeless tobacco: Never Used  . Alcohol Use: 8.4 oz/week    14 Cans of beer per week  . Drug Use: No  . Sexual Activity: Not on file   Other Topics Concern  . 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     He does not have children   Enjoys fishing/golf, recently caring for his wife.      Past Surgical History  Procedure Laterality Date  . Mohs surgery  2011  . Tonsillectomy  1964  . Tonsillectomy      Family History  Problem Relation Age of Onset  . COPD Mother   . Angina Mother     No Known Allergies  Current Outpatient Prescriptions on File Prior to Visit  Medication Sig Dispense Refill  . B Complex-Folic Acid (B COMPLEX PLUS PO) Take 1 tablet by mouth daily.    . Calcium-Magnesium-Zinc 167-83-8 MG TABS Take 1 tablet by mouth daily.    . clindamycin (CLEOCIN) 300 MG capsule Take  300 mg by mouth every 6 (six) hours.  0  . Liniments (SALONPAS PAIN RELIEF PATCH EX) Apply 1 patch topically daily as needed (pain).    . Multiple Vitamin (MULTIVITAMIN) tablet Take 1 tablet by mouth daily.     No current facility-administered medications on file prior to visit.    BP 114/70 mmHg  Pulse 75  Temp(Src) 98 F (36.7 C) (Oral)  Resp 16  Ht 5\' 9"  (1.753 m)  Wt 171 lb (77.565 kg)  BMI 25.24 kg/m2  SpO2 99%       Objective:   Physical Exam  Constitutional: He is oriented to person, place, and time. He appears well-developed and well-nourished. No distress.  HENT:  Head: Normocephalic and atraumatic.  Neck: Neck supple.  Cardiovascular: Normal rate, regular rhythm and normal heart sounds.   Pulmonary/Chest: Effort normal and breath sounds normal. No respiratory distress. He has no wheezes. He has no rales.  Musculoskeletal: He exhibits no edema.  Lymphadenopathy:    He has no cervical adenopathy.  Neurological: He is alert and oriented to person, place, and time.  Skin: Skin is warm and dry. He is not diaphoretic.  Psychiatric: He has a normal mood  and affect. His behavior is normal. Judgment and thought content normal.          Assessment & Plan:  Patient seen along with [facility] NP-student.  I have personally seen and examined patient and agree with Ms. Whitmire's assessment and plan-  Initial cardiac eval in ED was negative.  Needs stress test- spoke with Dr. Lysbeth Penner office and they will arrange stress test.  Note provided for work- no heavy lifting/snow shoveling, light duty.  Can return to full duty if stress test normal. [name] NP

## 2014-04-05 NOTE — Telephone Encounter (Signed)
See letter. Once he completes his stress test if normal we can lift all restrictions.

## 2014-04-06 NOTE — Telephone Encounter (Signed)
Notified pt and he will pick letter up today.

## 2014-04-07 ENCOUNTER — Ambulatory Visit (HOSPITAL_COMMUNITY)
Admission: RE | Admit: 2014-04-07 | Discharge: 2014-04-07 | Disposition: A | Discharge status: Home / Self Care | Payer: Managed Care, Other (non HMO) | Source: Ambulatory Visit | Attending: Cardiovascular Disease | Admitting: Cardiovascular Disease

## 2014-04-07 DIAGNOSIS — R079 Chest pain, unspecified: Secondary | ICD-10-CM | POA: Diagnosis not present

## 2014-04-07 DIAGNOSIS — R0602 Shortness of breath: Secondary | ICD-10-CM | POA: Insufficient documentation

## 2014-04-07 NOTE — Procedures (Signed)
Exercise Treadmill Test  Pre-Exercise Testing Evaluation  NSR, QS V1-V3  Test  Exercise Tolerance Test Ordering MD: Pixie Casino, MD    Unique Test No: 1  Treadmill:  1  Indication for ETT: exertional dyspnea  Contraindication to ETT: No   Stress Modality: exercise - treadmill  Cardiac Imaging Performed: non   Protocol: standard Bruce - maximal  Max BP:  175/84  Max MPHR (bpm):  159 85% MPR (bpm):  135  MPHR obtained (bpm):  160 % MPHR obtained:  100  Reached 85% MPHR (min:sec):  3:45 Total Exercise Time (min-sec):  6  Workload in METS:  7.0 Borg Scale: 15  Reason ETT Terminated:  SOB and Fatigue    ST Segment Analysis At Rest: normal ST segments - no evidence of significant ST depression With Exercise: no evidence of significant ST depression  Other Information Arrhythmia:  PACs with aberrant conduction and rate related incomplete LBBB during exercise Angina during ETT:  absent (0) Quality of ETT:  diagnostic  ETT Interpretation:  normal - no evidence of ischemia by ST analysis  Comments: Hypertension at rest and with exercise. Fair exercise tolerance. Nonspecific ST depression (< 1 mm) and T wave inversion in the lateral leads is likely due to rate related conduction abnormality  Sanda Klein, MD, [facility] [phone] office [phone] pager

## 2014-04-09 ENCOUNTER — Encounter: Payer: Self-pay | Admitting: *Deleted

## 2014-04-09 ENCOUNTER — Telehealth: Payer: Self-pay | Admitting: Internal Medicine

## 2014-04-09 NOTE — Telephone Encounter (Signed)
Pt called in stating that he had an stress test done on 1/27 and would like to know if his results were in yet, he mentioned that is the only way he can return to work. Please f/u  Thanks

## 2014-04-09 NOTE — Telephone Encounter (Signed)
DR. Debara Pickett OKd return to work based on normal stress test results. Called to inform pt of findings, he will get fax number to send letter and call back w/ that info,

## 2014-04-09 NOTE — Telephone Encounter (Signed)
Faxed to #[phone] [employer] Attn: Cassie Freer

## 2014-04-13 ENCOUNTER — Encounter: Payer: Self-pay | Admitting: Internal Medicine

## 2014-04-13 NOTE — Telephone Encounter (Signed)
Returning [place] call from yesterday,concerning his stress test results.

## 2014-04-13 NOTE — Telephone Encounter (Signed)
This encounter was created in error - please disregard.

## 2014-04-27 ENCOUNTER — Ambulatory Visit (INDEPENDENT_AMBULATORY_CARE_PROVIDER_SITE_OTHER): Payer: Managed Care, Other (non HMO) | Admitting: Internal Medicine

## 2014-04-27 ENCOUNTER — Encounter: Payer: Self-pay | Admitting: Internal Medicine

## 2014-04-27 VITALS — BP 112/70 | HR 84 | Ht 69.0 in | Wt 173.9 lb

## 2014-04-27 DIAGNOSIS — R0602 Shortness of breath: Secondary | ICD-10-CM

## 2014-04-27 DIAGNOSIS — R9431 Abnormal electrocardiogram [ECG] [EKG]: Secondary | ICD-10-CM

## 2014-04-27 NOTE — Progress Notes (Signed)
OFFICE NOTE  Chief Complaint:  Follow-up stress test  Primary Care Physician: Nance Pear., NP  HPI: Jose Strong is pleasant 64 year old male who I recently met in the emergency department at [facility].  He has a past medical history significant for tobacco abuse (1/2 ppd x 20 years), quit in 1990's. He also reports some sporadic palpitations and has been shown to have PVC's in the past. Today, he was shoveling snow at [facility] and became acutely dyspneic. He felt dizzy and lightheaded all over. He endorsed upper midepigastric tenderness and focal, sharp left lateral chest pain over the lower ribs. This was somewhat worse with breathing. He also became significantly dyspneic walking up stairs. He denies any anginal symptoms. Recently he's been exercising more and has lost about 20 pounds in the last 2 years. He showed me a fitbit record, accomplishing between 10 and 15,000 steps a day. He did have an EKG when he established primary care at [facility] in 2014. This demonstrated very small inferior R waves but was interpreted as inferior infarct. He was apparently referred for stress test but this was denied by his insurance due to lack of cardiac risk factors. His EKG today is similar but also includes poor R-wave progression anteriorly. There are not diagnostic Q waves, rather very small R waves with a late transition V4. Initial laboratory work shows a mildly elevated white blood cell count of 10,600. He tells me that he started treatment with an antibiotic on Monday after being seen over the weekend for a dental infection. Initial troponin is 0. A d-dimer is elevated at 1.44. The rest of his laboratory work is unremarkable.   He was referred for an outpatient exercise treadmill stress test which was performed in our office. This demonstrated no evidence of ischemia but mild repolarization changes. He had fair exercise tolerance and a marked hypertensive response to exercise however blood pressure is  normal at rest. This could indicate some deconditioning.   PMHx:  Past Medical History  Diagnosis Date  . Cancer 2011    melanoma --neck    Past Surgical History  Procedure Laterality Date  . Mohs surgery  2011  . Tonsillectomy  1964  . Tonsillectomy      FAMHx:  Family History  Problem Relation Age of Onset  . COPD Mother   . Angina Mother     SOCHx:   reports that he has quit smoking. He has never used smokeless tobacco. He reports that he drinks about 8.4 oz of alcohol per week. He reports that he does not use illicit drugs.  ALLERGIES:  No Known Allergies  ROS: A comprehensive review of systems was negative except for: Respiratory: positive for dyspnea on exertion  HOME MEDS: Current Outpatient Prescriptions  Medication Sig Dispense Refill  . B Complex-Folic Acid (B COMPLEX PLUS PO) Take 1 tablet by mouth daily.    . Calcium-Magnesium-Zinc 167-83-8 MG TABS Take 1 tablet by mouth daily.    . Multiple Vitamin (MULTIVITAMIN) tablet Take 1 tablet by mouth daily.     No current facility-administered medications for this visit.    LABS/IMAGING: No results found for this or any previous visit (from the past 48 hour(s)). No results found.  VITALS: BP 112/70 mmHg  Pulse 84  Ht '5\' 9"'  (1.753 m)  Wt 173 lb 14.4 oz (78.881 kg)  BMI 25.67 kg/m2  EXAM: deferred  EKG: deferred  ASSESSMENT: 1. Dyspnea on exertion-low risk stress test  PLAN: 1.   [name]  had marked dyspnea when shoveling snow and this is concerning for possible ischemia in the setting of a mildly abnormal EKG at rest. He ruled out for MI. D-dimer was elevated and he underwent a CT angiogram which was negative. The elevated d-dimer was attributed to a dental infection that he had the time. Subsequently underwent an exercise treadmill stress test our office which was low risk. I suspect that his shortness of breath with exertion was not related to ischemia. He should be a low risk of coronary events  going forward and I would recommend getting exercise and better conditioning.  Plan to see him back on an as-needed basis.  Pixie Casino, MD, [facility] Attending Cardiologist [facility]  Lataja Newland C 04/27/2014, 3:08 PM

## 2014-04-27 NOTE — Patient Instructions (Signed)
Please follow up as needed 

## 2015-01-31 IMAGING — CT CT ANGIO CHEST
1 of 8 series · 17 of 36 positions shown · IV contrast (Iohexol (Omnipaque 350))
Comparison: Current chest radiograph

CLINICAL DATA: Pt was shoveling snow at [REDACTED] and became suddenly
dizzy, SOB and lightheaded.Took a break but still was winded with
some tingling in bilateral hands

Hx of skin CA
No hx of chest sx
EXAM:
CT ANGIOGRAPHY CHEST WITH CONTRAST
TECHNIQUE: Multidetector CT imaging of the chest was performed using the
standard protocol during bolus administration of intravenous
contrast. Multiplanar CT image reconstructions and MIPs were
obtained to evaluate the vascular anatomy.
CONTRAST:  80mL OMNIPAQUE IOHEXOL 350 MG/ML SOLN

[Series 407: thins pacs · axial · 0.68mm/px · z∈[-222,-31]mm · 17 of 215 slices shown]
[im 12/215  lung]
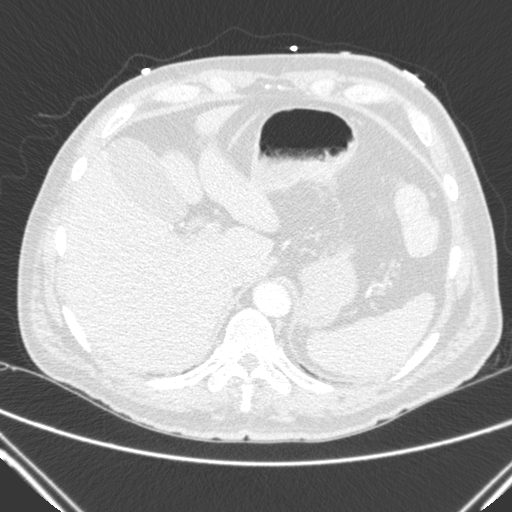
[im 24/215  mediastinal]
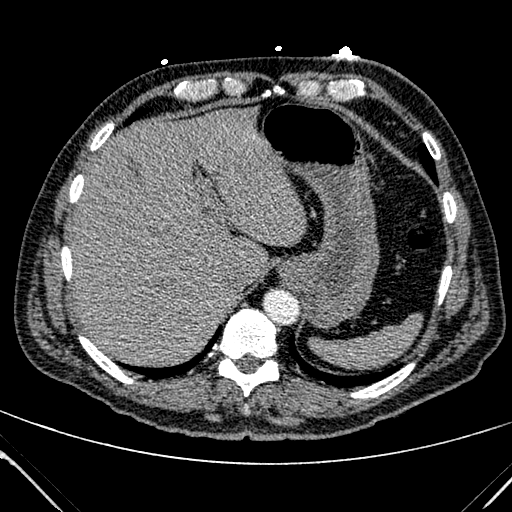
[im 36/215  lung]
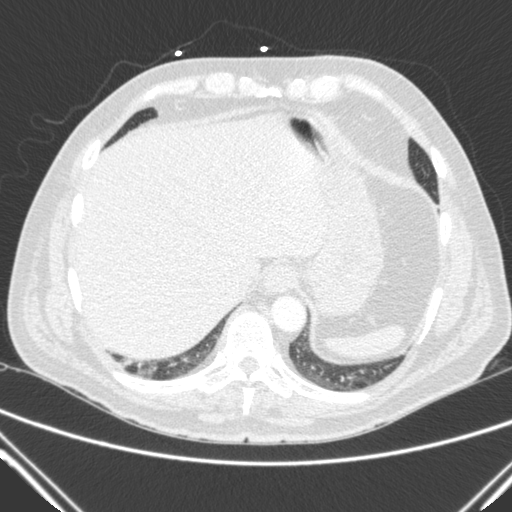
[im 48/215  mediastinal]
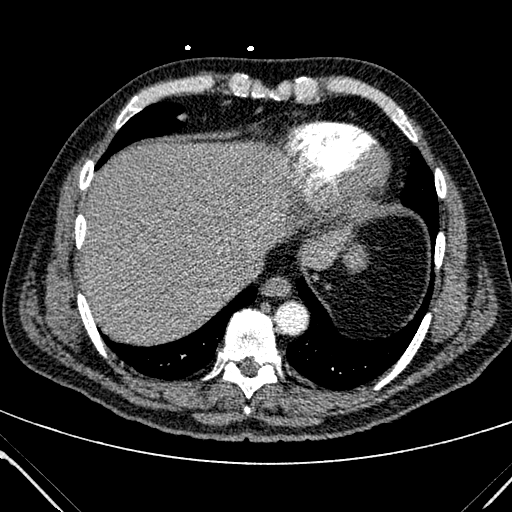
[im 60/215  lung]
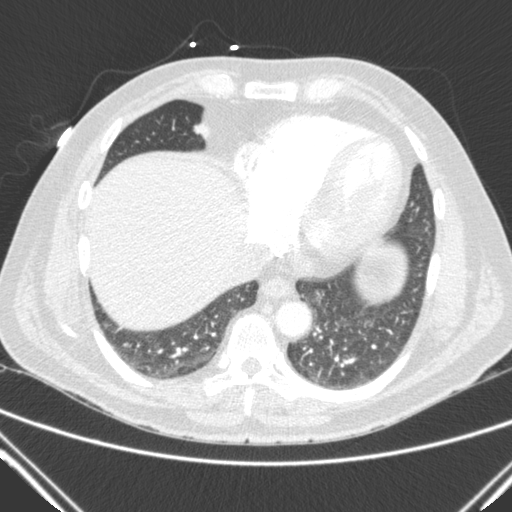
[im 72/215  mediastinal]
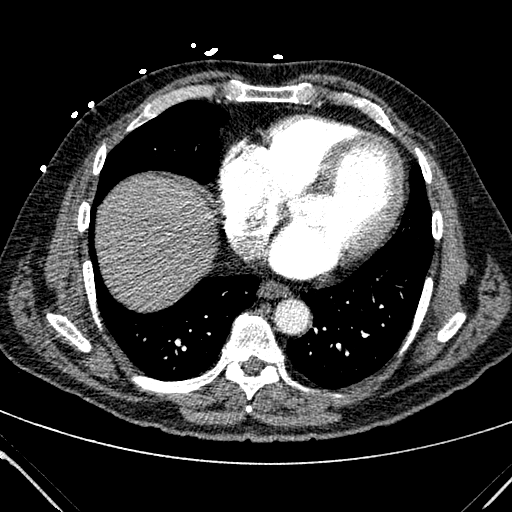
[im 84/215  lung]
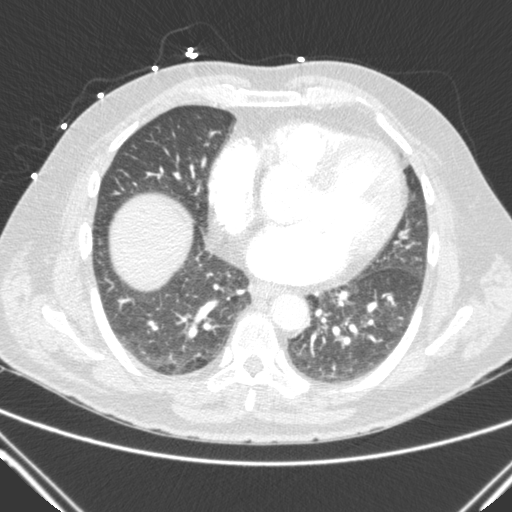
[im 96/215  mediastinal]
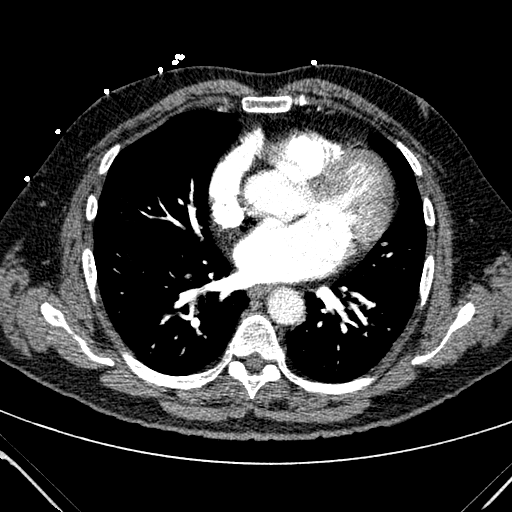
[im 108/215  lung]
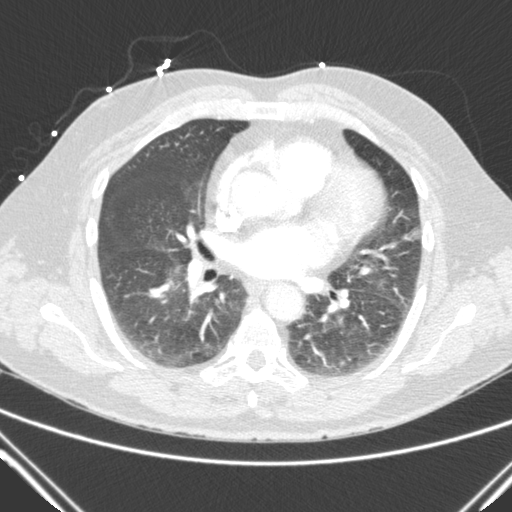
[im 119/215  mediastinal]
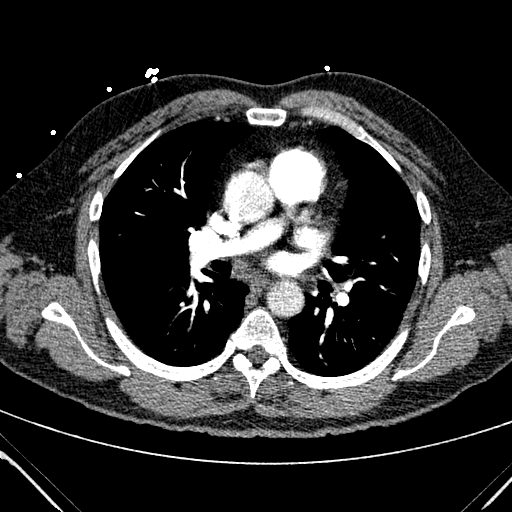
[im 131/215  lung]
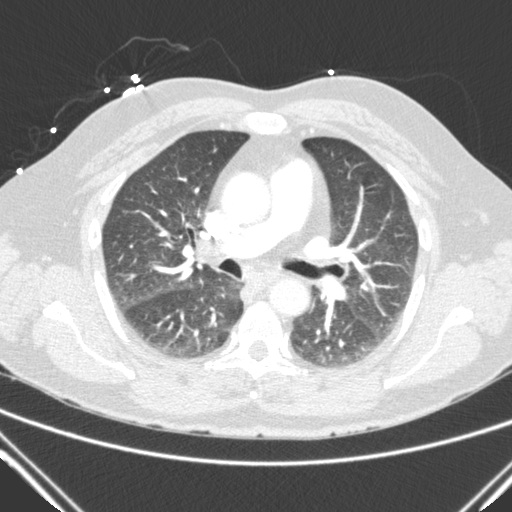
[im 143/215  mediastinal]
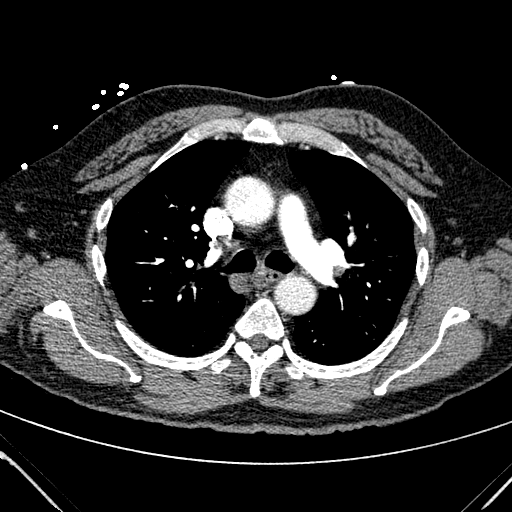
[im 155/215  lung]
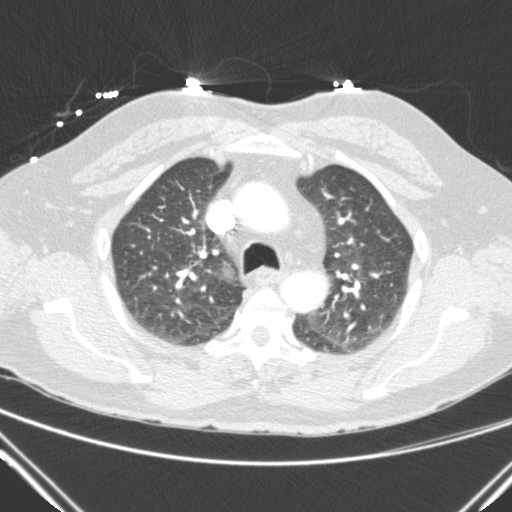
[im 167/215  mediastinal]
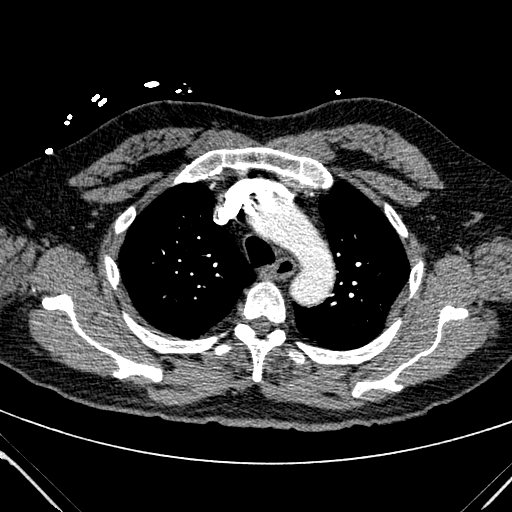
[im 179/215  lung]
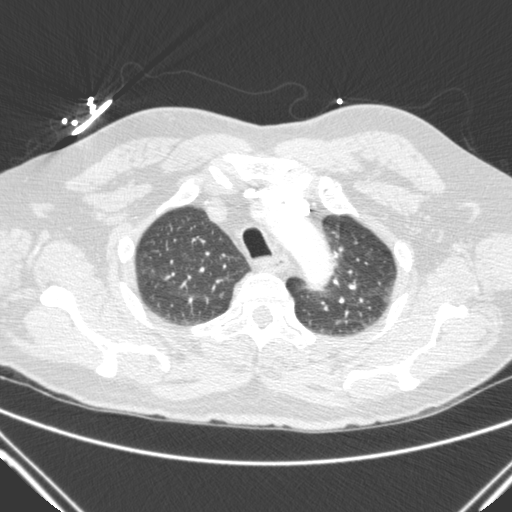
[im 191/215  mediastinal]
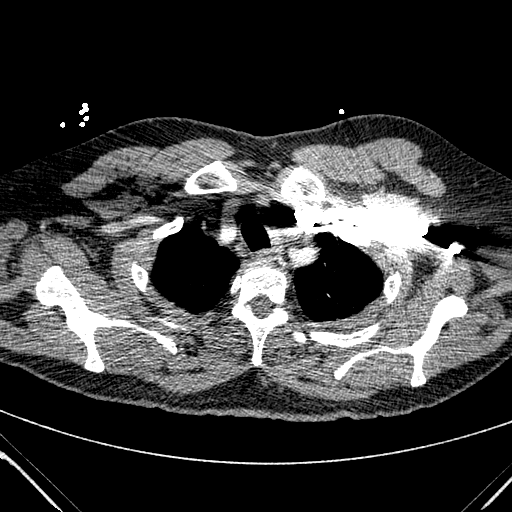
[im 203/215  lung]
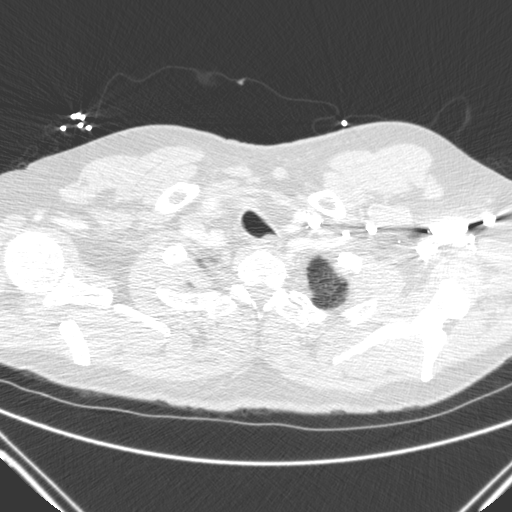

[17 of 36 positions shown; findings below may reference images not displayed]

FINDINGS: No evidence of a pulmonary embolus. Heart mildly enlarged. Great
vessels normal in caliber. No aortic dissection.

No neck base, axillary, mediastinal or hilar masses or adenopathy.

Lungs demonstrate mild dependent subsegmental atelectasis. No lung
consolidation. No edema. No pleural effusion or pneumothorax

There are disc degenerative changes along the mid and lower thoracic
spine. No osteoblastic or osteolytic lesions.

Review of the MIP images confirms the above findings.
IMPRESSION: 1. No evidence of a pulmonary embolus.
2. No acute findings. Lungs show mild dependent subsegmental
atelectasis. No evidence of pneumonia or pulmonary edema.

## 2015-12-27 ENCOUNTER — Telehealth: Payer: Self-pay | Admitting: Family

## 2015-12-27 ENCOUNTER — Ambulatory Visit (INDEPENDENT_AMBULATORY_CARE_PROVIDER_SITE_OTHER): Payer: BLUE CROSS/BLUE SHIELD | Admitting: Family

## 2015-12-27 ENCOUNTER — Encounter: Payer: Self-pay | Admitting: Family

## 2015-12-27 VITALS — BP 148/80 | HR 63 | Temp 98.1°F | Resp 16 | Ht 68.5 in | Wt 176.4 lb

## 2015-12-27 DIAGNOSIS — Z23 Encounter for immunization: Secondary | ICD-10-CM | POA: Diagnosis not present

## 2015-12-27 DIAGNOSIS — Z0000 Encounter for general adult medical examination without abnormal findings: Secondary | ICD-10-CM

## 2015-12-27 DIAGNOSIS — R030 Elevated blood-pressure reading, without diagnosis of hypertension: Secondary | ICD-10-CM | POA: Diagnosis not present

## 2015-12-27 DIAGNOSIS — R768 Other specified abnormal immunological findings in serum: Secondary | ICD-10-CM | POA: Diagnosis not present

## 2015-12-27 LAB — CBC WITH DIFFERENTIAL/PLATELET
BASOS PCT: 0.6 % (ref 0.0–3.0)
Basophils Absolute: 0 10*3/uL (ref 0.0–0.1)
EOS ABS: 0.2 10*3/uL (ref 0.0–0.7)
Eosinophils Relative: 2.1 % (ref 0.0–5.0)
HEMATOCRIT: 46.9 % (ref 39.0–52.0)
HEMOGLOBIN: 16.4 g/dL (ref 13.0–17.0)
LYMPHS PCT: 31.4 % (ref 12.0–46.0)
Lymphs Abs: 2.7 10*3/uL (ref 0.7–4.0)
MCHC: 34.9 g/dL (ref 30.0–36.0)
MCV: 90.2 fl (ref 78.0–100.0)
MONOS PCT: 5.7 % (ref 3.0–12.0)
Monocytes Absolute: 0.5 10*3/uL (ref 0.1–1.0)
NEUTROS ABS: 5.2 10*3/uL (ref 1.4–7.7)
Neutrophils Relative %: 60.2 % (ref 43.0–77.0)
PLATELETS: 193 10*3/uL (ref 150.0–400.0)
RBC: 5.2 Mil/uL (ref 4.22–5.81)
RDW: 13.2 % (ref 11.5–15.5)
WBC: 8.6 10*3/uL (ref 4.0–10.5)

## 2015-12-27 LAB — LIPID PANEL
CHOL/HDL RATIO: 5
Cholesterol: 240 mg/dL — ABNORMAL HIGH (ref 0–200)
HDL: 45.9 mg/dL (ref >39.00)
LDL Cholesterol: 161 mg/dL — ABNORMAL HIGH (ref 0–99)
NonHDL: 193.78
Triglycerides: 166 mg/dL — ABNORMAL HIGH (ref 0.0–149.0)
VLDL: 33.2 mg/dL (ref 0.0–40.0)

## 2015-12-27 LAB — URINALYSIS, ROUTINE W REFLEX MICROSCOPIC
BILIRUBIN URINE: NEGATIVE
Hgb urine dipstick: NEGATIVE
KETONES UR: NEGATIVE
LEUKOCYTES UA: NEGATIVE
Nitrite: NEGATIVE
PH: 7 (ref 5.0–8.0)
RBC / HPF: NONE SEEN (ref 0–?)
SPECIFIC GRAVITY, URINE: 1.01 (ref 1.000–1.030)
Total Protein, Urine: NEGATIVE
UROBILINOGEN UA: 0.2 (ref 0.0–1.0)
Urine Glucose: NEGATIVE

## 2015-12-27 LAB — HEPATIC FUNCTION PANEL
ALBUMIN: 4.7 g/dL (ref 3.5–5.2)
ALT: 20 U/L (ref 0–53)
AST: 18 U/L (ref 0–37)
Alkaline Phosphatase: 55 U/L (ref 39–117)
BILIRUBIN DIRECT: 0.1 mg/dL (ref 0.0–0.3)
TOTAL PROTEIN: 7.1 g/dL (ref 6.0–8.3)
Total Bilirubin: 0.7 mg/dL (ref 0.2–1.2)

## 2015-12-27 LAB — PSA: PSA: 3.31 ng/mL (ref 0.10–4.00)

## 2015-12-27 LAB — BASIC METABOLIC PANEL
BUN: 12 mg/dL (ref 6–23)
CHLORIDE: 105 meq/L (ref 96–112)
CO2: 28 meq/L (ref 19–32)
CREATININE: 0.79 mg/dL (ref 0.40–1.50)
Calcium: 9.5 mg/dL (ref 8.4–10.5)
GFR: 105.2 mL/min (ref >60.00)
Glucose, Bld: 104 mg/dL — ABNORMAL HIGH (ref 70–99)
Potassium: 4 mEq/L (ref 3.5–5.1)
SODIUM: 141 meq/L (ref 135–145)

## 2015-12-27 LAB — TSH: TSH: 1.38 u[IU]/mL (ref 0.35–4.50)

## 2015-12-27 NOTE — Patient Instructions (Signed)
Please complete lab work prior to leaving. Try to add regular exercise such as walking. Limit sodium and alcohol, - goal weight loss is 10 pounds.

## 2015-12-27 NOTE — Telephone Encounter (Signed)
Spoke with patient to give him the option of picking up his IFOB screening kit or have it mailed out. Patient states that he would like it to be mailed. Educated patient on instruction how to collect stool and apply each sample to each card. Patient states that he understands instructions. Patient had no further questions or concerns at this time.

## 2015-12-27 NOTE — Progress Notes (Signed)
 Subjective:    Patient ID: Jose Strong, male    DOB: 12/26/1950, 65 y.o.   MRN: [id number]  HPI  Patient presents today for complete physical. He lost his wife last year and has moved into a new home.   Immunizations: would like a flu shot today.   Diet: healthy diet Exercise:  Mows the lawn no formal exercise Colonoscopy: has never had a colonoscopy.   Wt Readings from Last 3 Encounters:  12/27/15 176 lb 6.4 oz (80 kg)  04/27/14 173 lb 14.4 oz (78.9 kg)  04/05/14 171 lb (77.6 kg)   He brings with him a paper from the [employer] which shows that he screened positive for hep C but confirmatory testing was negative.      Review of Systems  Constitutional: Negative for unexpected weight change.  HENT: Positive for tinnitus. Negative for hearing loss and rhinorrhea.   Eyes: Negative for visual disturbance.  Respiratory: Negative for cough.   Cardiovascular: Negative for leg swelling.  Gastrointestinal: Negative for constipation and diarrhea.  Genitourinary: Negative for dysuria and frequency.  Musculoskeletal: Negative for arthralgias and myalgias.  Skin: Negative for rash.  Neurological: Negative for headaches.  Hematological: Negative for adenopathy.  Psychiatric/Behavioral:       Denies depression/anxiety   Past Medical History:  Diagnosis Date  . Cancer (HCC) 2011   melanoma --neck     Social History   Social History  . Marital status: Married    Spouse name: N/A  . Number of children: 0  . Years of education: N/A   Occupational History  . Not on file.   Social History Main Topics  . Smoking status: Former Smoker  . Smokeless tobacco: Never Used  . Alcohol use Yes     Comment: 2-4 beers per day  . Drug use: No  . Sexual activity: Not on file   Other Topics Concern  . Not on file   Social History Narrative   Wife died 2016, had cancer   Unemployed    Completed HS   Not working   Has worked in property management.     He does not have  children   Enjoys fishing/golf, recently caring for his wife.      Past Surgical History:  Procedure Laterality Date  . MOHS SURGERY  2011  . TONSILLECTOMY  1964  . TONSILLECTOMY      Family History  Problem Relation Age of Onset  . COPD Mother   . Angina Mother     No Known Allergies  Current Outpatient Prescriptions on File Prior to Visit  Medication Sig Dispense Refill  . B Complex-Folic Acid (B COMPLEX PLUS PO) Take 1 tablet by mouth daily.    . Calcium-Magnesium-Zinc 167-83-8 MG TABS Take 1 tablet by mouth daily.    . Multiple Vitamin (MULTIVITAMIN) tablet Take 1 tablet by mouth daily.     No current facility-administered medications on file prior to visit.     BP (!) 148/80 (BP Location: Right Arm, Patient Position: Sitting, Cuff Size: Normal)   Pulse 63   Temp 98.1 F (36.7 C) (Oral)   Resp 16   Ht 5' 8.5" (1.74 m)   Wt 176 lb 6.4 oz (80 kg)   SpO2 100% Comment: ROOM AIR  BMI 26.43 kg/m       Objective:   Physical Exam Physical Exam  Constitutional: He is oriented to person, place, and time. He appears well-developed and well-nourished. No distress.  HENT:    Head: Normocephalic and atraumatic.  Right Ear: Tympanic membrane and ear canal normal.  Left Ear: Tympanic membrane and ear canal normal.  Mouth/Throat: Oropharynx is clear and moist.  Eyes: Pupils are equal, round, and reactive to light. No scleral icterus.  Neck: Normal range of motion. No thyromegaly present.  Cardiovascular: Normal rate and regular rhythm.   No murmur heard. Pulmonary/Chest: Effort normal and breath sounds normal. No respiratory distress. He has no wheezes. He has no rales. He exhibits no tenderness.  Abdominal: Soft. Bowel sounds are normal. He exhibits no distension and no mass. There is no tenderness. There is no rebound and no guarding.  Musculoskeletal: He exhibits no edema.  Lymphadenopathy:    He has no cervical adenopathy.  Neurological: He is alert and oriented to  person, place, and time. He has normal patellar reflexes. He exhibits normal muscle tone. Coordination normal.  Skin: Skin is warm and dry.  Psychiatric: He has a normal mood and affect. His behavior is normal. Judgment and thought content normal.          Assessment & Plan:          Assessment & Plan:  Preventative care-  Pt declines colo, agreeable to ifob kit. Discussed healthy diet, exercise and weight loss.  Goal bmi <25.  Discussed reducing alcohol intake.   BP Readings from Last 3 Encounters:  12/27/15 (!) 148/80  04/27/14 112/70  04/05/14 114/70   Abnormal hep C screening- will obtain follow up hep C antibody and RNA qualitative testing.    Elevated blood pressure- discussed diet/exercise weight loss. Will have him follow up in a few weeks with the RN for bp recheck.

## 2015-12-27 NOTE — Telephone Encounter (Signed)
I forgot to give him an IFOB kit for screening. Can we please mail to him? thanks

## 2015-12-27 NOTE — Progress Notes (Signed)
Pre visit review using our clinic review tool, if applicable. No additional management support is needed unless otherwise documented below in the visit note. 

## 2015-12-28 ENCOUNTER — Telehealth: Payer: Self-pay | Admitting: Family

## 2015-12-28 LAB — HEPATITIS C ANTIBODY: HCV Ab: NEGATIVE

## 2015-12-28 NOTE — Telephone Encounter (Signed)
Please let him know that his cholesterol is quite elevated. If he is agreeable, I would recommend that he begin atorvastatin once daily for cholesterol and repeat lipid panel in 6 weeks. Dx hyperlipidemia.   Hep C testing is negative. PSA normal. Other lab work looks good.

## 2015-12-30 MED ORDER — ATORVASTATIN CALCIUM 20 MG PO TABS: 20.0000 mg | ORAL_TABLET | Freq: Every day | ORAL | 3 refills | Status: DC

## 2015-12-30 NOTE — Telephone Encounter (Signed)
Spoke with patient, patient states he understand results, medication instructions, and follow up appointment with provider. Patient had no further questions or concerns at this time.

## 2016-02-10 ENCOUNTER — Encounter: Payer: Self-pay | Admitting: Family

## 2016-02-10 ENCOUNTER — Ambulatory Visit (INDEPENDENT_AMBULATORY_CARE_PROVIDER_SITE_OTHER): Payer: BLUE CROSS/BLUE SHIELD | Admitting: Family

## 2016-02-10 VITALS — BP 134/78 | HR 68 | Temp 98.1°F | Resp 16 | Ht 69.0 in | Wt 174.8 lb

## 2016-02-10 DIAGNOSIS — I10 Essential (primary) hypertension: Secondary | ICD-10-CM | POA: Diagnosis not present

## 2016-02-10 DIAGNOSIS — Z23 Encounter for immunization: Secondary | ICD-10-CM | POA: Diagnosis not present

## 2016-02-10 DIAGNOSIS — E785 Hyperlipidemia, unspecified: Secondary | ICD-10-CM

## 2016-02-10 NOTE — Assessment & Plan Note (Addendum)
10 yr CV risk is 14.3 percent. We discussed this. He wishes to remain off of statin and repeat lipids next visit in 6 months.

## 2016-02-10 NOTE — Progress Notes (Signed)
Pre visit review using our clinic review tool, if applicable. No additional management support is needed unless otherwise documented below in the visit note. 

## 2016-02-10 NOTE — Progress Notes (Signed)
Subjective:    Patient ID: Jose Strong, male    DOB: [birth year], 65 y.o.   MRN: [id number]  HPI  Jose Strong is a 65 yr old male who presents today for follow up.  HTN- reports that he has been working on diet.   BP Readings from Last 3 Encounters:  02/10/16 134/78  12/27/15 (!) 148/80  04/27/14 112/70   Hyperlipidemia- declines statin at this time. Wants to do everything "holistically" for the next 6 months.  Lab Results  Component Value Date   CHOL 240 (H) 12/27/2015   HDL 45.90 12/27/2015   LDLCALC 161 (H) 12/27/2015   TRIG 166.0 (H) 12/27/2015   CHOLHDL 5 12/27/2015    Review of Systems  Past Medical History:  Diagnosis Date  . Cancer [facility]) 2009-06-25   melanoma --neck     Social History   Social History  . Marital status: Married    Spouse name: N/A  . Number of children: 0  . Years of education: N/A   Occupational History  . Not on file.   Social History Main Topics  . Smoking status: Former Research scientist (life sciences)  . Smokeless tobacco: Never Used  . Alcohol use Yes     Comment: 2-4 beers per day  . Drug use: No  . Sexual activity: Not on file   Other Topics Concern  . 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     He does not have children   Enjoys fishing/golf, recently caring for his wife.      Past Surgical History:  Procedure Laterality Date  . [place]  2009/06/25  . TONSILLECTOMY  1964  . TONSILLECTOMY      Family History  Problem Relation Age of Onset  . COPD Mother   . Angina Mother     No Known Allergies  Current Outpatient Prescriptions on File Prior to Visit  Medication Sig Dispense Refill  . B Complex-Folic Acid (B COMPLEX PLUS PO) Take 1 tablet by mouth daily.    . Calcium-Magnesium-Zinc 167-83-8 MG TABS Take 1 tablet by mouth daily.    . Multiple Vitamin (MULTIVITAMIN) tablet Take 1 tablet by mouth daily.    . TURMERIC PO Take 2  tablets by mouth daily.     . Vitamins C E (VITAMIN C & E COMBINATION PO) Take 1 capsule by mouth daily.    Marland Kitchen atorvastatin (LIPITOR) 20 MG tablet Take 1 tablet (20 mg total) by mouth daily. (Patient not taking: Reported on 02/10/2016) 30 tablet 3   No current facility-administered medications on file prior to visit.     BP 134/78 (BP Location: Right Arm, Cuff Size: Normal)   Pulse 68   Temp 98.1 F (36.7 C) (Oral)   Resp 16   Ht 5\' 9"  (1.753 m)   Wt 174 lb 12.8 oz (79.3 kg)   SpO2 100%   BMI 25.81 kg/m        Objective:   Physical Exam  Constitutional: He is oriented to person, place, and time. He appears well-developed and well-nourished. No distress.  HENT:  Head: Normocephalic and atraumatic.  Cardiovascular: Normal rate and regular rhythm.   No murmur heard. Pulmonary/Chest: Effort normal and breath sounds normal. No respiratory distress. He has no wheezes. He has no rales.  Musculoskeletal: He exhibits no edema.  Neurological: He is alert  and oriented to person, place, and time.  Skin: Skin is warm and dry.  Psychiatric: He has a normal mood and affect. His behavior is normal. Thought content normal.          Assessment & Plan:  HTN- BP is improved with dietary changes, continue to monitor.   Td today.

## 2016-02-10 NOTE — Patient Instructions (Signed)
Please continue to work on healthy diet, exercise and weight loss.  ° °

## 2016-08-10 ENCOUNTER — Ambulatory Visit (INDEPENDENT_AMBULATORY_CARE_PROVIDER_SITE_OTHER): Payer: BLUE CROSS/BLUE SHIELD | Admitting: Family

## 2016-08-10 ENCOUNTER — Encounter: Payer: Self-pay | Admitting: Family

## 2016-08-10 VITALS — BP 138/95 | HR 90 | Temp 98.5°F | Resp 18 | Ht 69.0 in | Wt 171.2 lb

## 2016-08-10 DIAGNOSIS — E785 Hyperlipidemia, unspecified: Secondary | ICD-10-CM

## 2016-08-10 DIAGNOSIS — R030 Elevated blood-pressure reading, without diagnosis of hypertension: Secondary | ICD-10-CM

## 2016-08-10 DIAGNOSIS — F4320 Adjustment disorder, unspecified: Secondary | ICD-10-CM | POA: Diagnosis not present

## 2016-08-10 DIAGNOSIS — F4321 Adjustment disorder with depressed mood: Secondary | ICD-10-CM | POA: Insufficient documentation

## 2016-08-10 LAB — LIPID PANEL
Cholesterol: 250 mg/dL — ABNORMAL HIGH (ref 0–200)
HDL: 48.8 mg/dL (ref >39.00)
LDL Cholesterol: 164 mg/dL — ABNORMAL HIGH (ref 0–99)
NonHDL: 201.6
Total CHOL/HDL Ratio: 5
Triglycerides: 189 mg/dL — ABNORMAL HIGH (ref 0.0–149.0)
VLDL: 37.8 mg/dL (ref 0.0–40.0)

## 2016-08-10 NOTE — Assessment & Plan Note (Signed)
Obtain follow up fasting lipid panel today.

## 2016-08-10 NOTE — Assessment & Plan Note (Signed)
He seems to be grieving appropriately. We discussed that if his symptoms worsen or fail to improve to let me know. We discussed counseling but he declines at this time.

## 2016-08-10 NOTE — Assessment & Plan Note (Signed)
BP mildly elevated today. He plans to start exercising, follow up in 3 months for repeat bp.

## 2016-08-10 NOTE — Patient Instructions (Signed)
Please complete lab work prior to leaving.   

## 2016-08-10 NOTE — Progress Notes (Signed)
Subjective:    Patient ID: Jose Strong, male    DOB: 1950/05/10, 66 y.o.   MRN: [id number]  HPI  Jose Strong is a 66 yr old male who presents today for follow up.  1) HTN- not currently on BP meds.   BP Readings from Last 3 Encounters:  08/10/16 (!) 144/98  02/10/16 134/78  12/27/15 (!) 148/80   2) Hyperlipidemia- not currently on statin.   Lab Results  Component Value Date   CHOL 240 (H) 12/27/2015   HDL 45.90 12/27/2015   LDLCALC 161 (H) 12/27/2015   TRIG 166.0 (H) 12/27/2015   CHOLHDL 5 12/27/2015    He is pleased that he has lost some weight. He has been more active lately  Wt Readings from Last 3 Encounters:  08/10/16 171 lb 3.2 oz (77.7 kg)  02/10/16 174 lb 12.8 oz (79.3 kg)  12/27/15 176 lb 6.4 oz (80 kg)   He reports that April was the anniversary of his wife's death. Reports that he really got down around that time.  Feels like he is doing a little better.  Review of Systems See HPI  Past Medical History:  Diagnosis Date  . Cancer [facility]) 06-09-2009   melanoma --neck     Social History   Social History  . Marital status: Married    Spouse name: N/A  . Number of children: 0  . Years of education: N/A   Occupational History  . Not on file.   Social History Main Topics  . Smoking status: Former Research scientist (life sciences)  . Smokeless tobacco: Never Used  . Alcohol use Yes     Comment: 2-4 beers per day  . Drug use: No  . Sexual activity: Not on file   Other Topics Concern  . 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     He does not have children   Enjoys fishing/golf, recently caring for his wife.      Past Surgical History:  Procedure Laterality Date  . [place]  2009-06-09  . TONSILLECTOMY  1964  . TONSILLECTOMY      Family History  Problem Relation Age of Onset  . COPD Mother   . Angina Mother     No Known Allergies  Current Outpatient  Prescriptions on File Prior to Visit  Medication Sig Dispense Refill  . B Complex-Folic Acid (B COMPLEX PLUS PO) Take 1 tablet by mouth daily.    . Calcium-Magnesium-Zinc 167-83-8 MG TABS Take 1 tablet by mouth daily.    . Multiple Vitamin (MULTIVITAMIN) tablet Take 1 tablet by mouth daily.    . TURMERIC PO Take 2 tablets by mouth daily.     . Vitamins C E (VITAMIN C & E COMBINATION PO) Take 1 capsule by mouth daily.     No current facility-administered medications on file prior to visit.     BP (!) 144/98 (BP Location: Right Arm, Cuff Size: Normal)   Pulse 90   Temp 98.5 F (36.9 C) (Oral)   Resp 18   Ht 5\' 9"  (1.753 m)   Wt 171 lb 3.2 oz (77.7 kg)   SpO2 100%   BMI 25.28 kg/m       Objective:   Physical Exam  Constitutional: He is oriented to person, place, and time. He appears well-developed and well-nourished. No distress.  HENT:  Head: Normocephalic and atraumatic.  Cardiovascular: Normal rate and regular rhythm.   No murmur heard. Pulmonary/Chest: Effort normal and breath sounds normal. No respiratory distress. He has no wheezes. He has no rales.  Musculoskeletal: He exhibits no edema.  Neurological: He is alert and oriented to person, place, and time.  Skin: Skin is warm and dry.  Psychiatric: He has a normal mood and affect. His behavior is normal. Thought content normal.          Assessment & Plan:

## 2016-08-13 ENCOUNTER — Other Ambulatory Visit: Payer: Self-pay | Admitting: Family

## 2016-08-13 NOTE — Telephone Encounter (Signed)
Cholesterol is high.  His calculated risk for heart attack or stroke in the next 10 years is 16 percent. Recommendation for this level of risk is statin therapy. I would recommend that he start atorvastatin once daily and work on low fat/low cholesterol diet/exercise.  This will reduce his risk. Repeat lipids in 3 months.

## 2016-08-15 NOTE — Telephone Encounter (Signed)
Left message for pt to return my call.

## 2016-08-21 NOTE — Telephone Encounter (Signed)
Left message for pt to return my call.

## 2016-08-22 MED ORDER — ATORVASTATIN CALCIUM 20 MG PO TABS: 20.0000 mg | ORAL_TABLET | Freq: Every day | ORAL | 3 refills | Status: DC

## 2016-08-22 NOTE — Telephone Encounter (Signed)
Notified pt and he is agreeable to try medication. Rx sent. Pt has appt on 11/13/16 and will repeat fasting lipids at that time.

## 2016-09-17 ENCOUNTER — Telehealth: Payer: Self-pay | Admitting: Family

## 2016-09-17 NOTE — Telephone Encounter (Signed)
Patient Name: Jose Strong  DOB: 02-Dec-1950    Initial Comment Caller states he was stung by a bee on his leg. His leg is swollen.   Nurse Assessment  Nurse: Raphael Gibney, RN, Vanita Ingles Date/Time (Eastern Time): 09/17/2016 9:19:21 AM  Confirm and document reason for call. If symptomatic, describe symptoms. ---Caller states he was stung by 4 hornets on the calf of his right leg. used tobacco. His whole calf is swollen and hot. Has trouble walking due to the pain.  Does the patient have any new or worsening symptoms? ---Yes  Will a triage be completed? ---Yes  Related visit to physician within the last 2 weeks? ---No  Does the PT have any chronic conditions? (i.e. diabetes, asthma, etc.) ---No  Is this a behavioral health or substance abuse call? ---No     Guidelines    Guideline Title Affirmed Question Affirmed Notes  Bee or Yellow Jacket Sting Swelling is huge (e.g., > 4 inches or 10 cm, spreads beyond wrist or ankle)    Final Disposition User   See Physician within 24 Hours [place], RN, Vanita Ingles    Comments  advised caller he can take benadryl and tylenol together as per [email or web address]  pt does not want to make appt at this time as he states he would have trouble driving due to pain He is going to take benadryl and tylenol.  pt was stung on his calf yesterday.   Referrals  GO TO FACILITY REFUSED   Disagree/Comply: Disagree  Disagree/Comply Reason: Wait and see

## 2016-11-13 ENCOUNTER — Encounter: Payer: Self-pay | Admitting: Family

## 2016-11-13 ENCOUNTER — Ambulatory Visit (INDEPENDENT_AMBULATORY_CARE_PROVIDER_SITE_OTHER): Payer: BLUE CROSS/BLUE SHIELD | Admitting: Family

## 2016-11-13 VITALS — BP 145/88 | HR 70 | Temp 98.3°F | Ht 69.0 in | Wt 177.0 lb

## 2016-11-13 DIAGNOSIS — E785 Hyperlipidemia, unspecified: Secondary | ICD-10-CM | POA: Diagnosis not present

## 2016-11-13 DIAGNOSIS — I10 Essential (primary) hypertension: Secondary | ICD-10-CM | POA: Diagnosis not present

## 2016-11-13 DIAGNOSIS — Z23 Encounter for immunization: Secondary | ICD-10-CM

## 2016-11-13 MED ORDER — AMLODIPINE BESYLATE 2.5 MG PO TABS: 2.5000 mg | ORAL_TABLET | Freq: Every day | ORAL | 3 refills | Status: DC

## 2016-11-13 NOTE — Patient Instructions (Signed)
Please start amlodipine once daily.

## 2016-11-13 NOTE — Progress Notes (Signed)
Subjective:    Patient ID: Jose Strong, male    DOB: 10/30/1950, 67 y.o.   MRN: [id number]  HPI  Jose Strong is a 66 yr old male who presents today for follow up of his blood pressure. Last visit BP was elevated. He is not currently maintained on an antihypertensive.   BP Readings from Last 3 Encounters:  11/13/16 (!) 145/88  08/10/16 (!) 138/95  02/10/16 134/78    He is not taking his statin- reports that he prefers to focus on natural means of lowering his blood pressure.  Lab Results  Component Value Date   CHOL 250 (H) 08/10/2016   HDL 48.80 08/10/2016   LDLCALC 164 (H) 08/10/2016   TRIG 189.0 (H) 08/10/2016   CHOLHDL 5 08/10/2016   Reports that his mood has been better since he made it through the anniversary of his wife's death.   Review of Systems    see HPI  Past Medical History:  Diagnosis Date  . Cancer [facility]) 23-Jun-2009   melanoma --neck     Social History   Social History  . Marital status: Married    Spouse name: N/A  . Number of children: 0  . Years of education: N/A   Occupational History  . Not on file.   Social History Main Topics  . Smoking status: Former Research scientist (life sciences)  . Smokeless tobacco: Never Used  . Alcohol use Yes     Comment: 2-4 beers per day  . Drug use: No  . Sexual activity: Not on file   Other Topics Concern  . 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     He does not have children   Enjoys fishing/golf, recently caring for his wife.      Past Surgical History:  Procedure Laterality Date  . [place]  June 23, 2009  . TONSILLECTOMY  1964  . TONSILLECTOMY      Family History  Problem Relation Age of Onset  . COPD Mother   . Angina Mother     No Known Allergies  Current Outpatient Prescriptions on File Prior to Visit  Medication Sig Dispense Refill  . B Complex-Folic Acid (B COMPLEX PLUS PO) Take 1 tablet by mouth daily.     . Calcium-Magnesium-Zinc 167-83-8 MG TABS Take 1 tablet by mouth daily.    . Multiple Vitamin (MULTIVITAMIN) tablet Take 1 tablet by mouth daily.    . TURMERIC PO Take 2 tablets by mouth daily.     . Vitamins C E (VITAMIN C & E COMBINATION PO) Take 1 capsule by mouth daily.    Marland Kitchen atorvastatin (LIPITOR) 20 MG tablet Take 1 tablet (20 mg total) by mouth daily. (Patient not taking: Reported on 11/13/2016) 30 tablet 3   No current facility-administered medications on file prior to visit.     BP (!) 145/88   Pulse 70   Temp 98.3 F (36.8 C) (Oral)   Ht 5\' 9"  (1.753 m)   Wt 177 lb (80.3 kg)   SpO2 99%   BMI 26.14 kg/m    Objective:   Physical Exam  Constitutional: He is oriented to person, place, and time. He appears well-developed and well-nourished. No distress.  HENT:  Head: Normocephalic and atraumatic.  Cardiovascular: Normal rate and regular rhythm.   No murmur heard. Pulmonary/Chest: Effort normal and breath sounds normal. No  respiratory distress. He has no wheezes. He has no rales.  Musculoskeletal: He exhibits no edema.  Neurological: He is alert and oriented to person, place, and time.  Skin: Skin is warm and dry.  Psychiatric: He has a normal mood and affect. His behavior is normal. Thought content normal.          Assessment & Plan:  HTN- BP remains above goal. Will add amlodipine 2.5 mg once daily.   Hyperlipidemia- discussed low fat/low cholesterol diet, exercise.

## 2016-12-31 ENCOUNTER — Encounter: Payer: Self-pay | Admitting: Family

## 2016-12-31 ENCOUNTER — Telehealth: Payer: Self-pay | Admitting: Family

## 2016-12-31 ENCOUNTER — Ambulatory Visit (INDEPENDENT_AMBULATORY_CARE_PROVIDER_SITE_OTHER): Payer: BLUE CROSS/BLUE SHIELD | Admitting: Family

## 2016-12-31 VITALS — BP 142/100 | HR 101 | Temp 98.3°F | Resp 18 | Ht 68.5 in | Wt 178.4 lb

## 2016-12-31 DIAGNOSIS — I10 Essential (primary) hypertension: Secondary | ICD-10-CM

## 2016-12-31 DIAGNOSIS — Z125 Encounter for screening for malignant neoplasm of prostate: Secondary | ICD-10-CM | POA: Diagnosis not present

## 2016-12-31 DIAGNOSIS — Z23 Encounter for immunization: Secondary | ICD-10-CM

## 2016-12-31 DIAGNOSIS — Z0000 Encounter for general adult medical examination without abnormal findings: Secondary | ICD-10-CM | POA: Diagnosis not present

## 2016-12-31 LAB — HEPATIC FUNCTION PANEL
ALBUMIN: 4.7 g/dL (ref 3.5–5.2)
ALT: 27 U/L (ref 0–53)
AST: 22 U/L (ref 0–37)
Alkaline Phosphatase: 54 U/L (ref 39–117)
Bilirubin, Direct: 0.1 mg/dL (ref 0.0–0.3)
Total Bilirubin: 0.7 mg/dL (ref 0.2–1.2)
Total Protein: 7.3 g/dL (ref 6.0–8.3)

## 2016-12-31 LAB — BASIC METABOLIC PANEL
BUN: 12 mg/dL (ref 6–23)
CHLORIDE: 101 meq/L (ref 96–112)
CO2: 29 mEq/L (ref 19–32)
CREATININE: 0.82 mg/dL (ref 0.40–1.50)
Calcium: 10.1 mg/dL (ref 8.4–10.5)
GFR: 100.45 mL/min (ref >60.00)
GLUCOSE: 108 mg/dL — AB (ref 70–99)
Potassium: 4.6 mEq/L (ref 3.5–5.1)
Sodium: 140 mEq/L (ref 135–145)

## 2016-12-31 LAB — CBC WITH DIFFERENTIAL/PLATELET
BASOS PCT: 0.5 % (ref 0.0–3.0)
Basophils Absolute: 0.1 10*3/uL (ref 0.0–0.1)
EOS PCT: 2.5 % (ref 0.0–5.0)
Eosinophils Absolute: 0.3 10*3/uL (ref 0.0–0.7)
HCT: 52.4 % — ABNORMAL HIGH (ref 39.0–52.0)
HEMOGLOBIN: 17.8 g/dL — AB (ref 13.0–17.0)
Lymphocytes Relative: 32 % (ref 12.0–46.0)
Lymphs Abs: 3.4 10*3/uL (ref 0.7–4.0)
MCHC: 34 g/dL (ref 30.0–36.0)
MCV: 95.7 fl (ref 78.0–100.0)
MONO ABS: 0.6 10*3/uL (ref 0.1–1.0)
Monocytes Relative: 6 % (ref 3.0–12.0)
NEUTROS ABS: 6.2 10*3/uL (ref 1.4–7.7)
Neutrophils Relative %: 59 % (ref 43.0–77.0)
PLATELETS: 230 10*3/uL (ref 150.0–400.0)
RBC: 5.48 Mil/uL (ref 4.22–5.81)
RDW: 13.1 % (ref 11.5–15.5)
WBC: 10.5 10*3/uL (ref 4.0–10.5)

## 2016-12-31 LAB — LDL CHOLESTEROL, DIRECT: LDL DIRECT: 173 mg/dL

## 2016-12-31 LAB — URINALYSIS, ROUTINE W REFLEX MICROSCOPIC
BILIRUBIN URINE: NEGATIVE
Ketones, ur: NEGATIVE
LEUKOCYTES UA: NEGATIVE
NITRITE: NEGATIVE
Specific Gravity, Urine: 1.015 (ref 1.000–1.030)
Total Protein, Urine: NEGATIVE
URINE GLUCOSE: NEGATIVE
Urobilinogen, UA: 0.2 (ref 0.0–1.0)
pH: 6 (ref 5.0–8.0)

## 2016-12-31 LAB — LIPID PANEL
CHOL/HDL RATIO: 5
CHOLESTEROL: 267 mg/dL — AB (ref 0–200)
HDL: 51.5 mg/dL (ref >39.00)
NonHDL: 215.52
Triglycerides: 301 mg/dL — ABNORMAL HIGH (ref 0.0–149.0)
VLDL: 60.2 mg/dL — AB (ref 0.0–40.0)

## 2016-12-31 LAB — TSH: TSH: 2.1 u[IU]/mL (ref 0.35–4.50)

## 2016-12-31 LAB — PSA: PSA: 3.04 ng/mL (ref 0.10–4.00)

## 2016-12-31 MED ORDER — AMLODIPINE BESYLATE 5 MG PO TABS: 5.0000 mg | ORAL_TABLET | Freq: Every day | ORAL | 3 refills | Status: DC

## 2016-12-31 NOTE — Telephone Encounter (Signed)
Could you please initiate Cologuard for him?

## 2016-12-31 NOTE — Progress Notes (Signed)
Subjective:    Patient ID: Jose Strong, male    DOB: [birth year], 66 y.o.   MRN: [id number]  HPI  Jose Strong is a 66 yr old male who presents today for cpx.  Patient presents today for complete physical.  Immunizations: Td 23-Jun-2015, flu shot today Diet: diet is fair Exercise: mows lawn. Colonoscopy: declines colonoscopy.   Dental:  Due  Vision: due  Wt Readings from Last 3 Encounters:  12/31/16 178 lb 6.4 oz (80.9 kg)  11/13/16 177 lb (80.3 kg)  08/10/16 171 lb 3.2 oz (77.7 kg)   Review of Systems  Constitutional: Negative for unexpected weight change.  HENT: Positive for hearing loss and tinnitus.   Respiratory: Negative for cough.        Has some sob with physical activity- not new. Was evaluated by cardiology  Cardiovascular: Negative for chest pain and leg swelling.  Gastrointestinal: Negative for blood in stool, constipation and diarrhea.  Genitourinary: Negative for dysuria, frequency and hematuria.  Musculoskeletal: Negative for arthralgias and myalgias.  Neurological: Negative for headaches.  Hematological: Negative for adenopathy.  Psychiatric/Behavioral:       Denies depression/anxiety   Past Medical History:  Diagnosis Date  . Cancer [facility]) 06-22-09   melanoma --neck     Social History   Social History  . Marital status: Married    Spouse name: N/A  . Number of children: 0  . Years of education: N/A   Occupational History  . Not on file.   Social History Main Topics  . Smoking status: Former Research scientist (life sciences)  . Smokeless tobacco: Never Used  . Alcohol use Yes     Comment: 2-4 beers per day  . Drug use: Yes    Types: Amyl nitrate  . Sexual activity: Not on file   Other Topics Concern  . 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     He does not have children   Enjoys fishing/golf, recently caring for his wife.      Past Surgical History:    Procedure Laterality Date  . [place]  06-22-09  . TONSILLECTOMY  1964  . TONSILLECTOMY      Family History  Problem Relation Age of Onset  . COPD Mother   . Angina Mother     No Known Allergies  Current Outpatient Prescriptions on File Prior to Visit  Medication Sig Dispense Refill  . B Complex-Folic Acid (B COMPLEX PLUS PO) Take 1 tablet by mouth daily.    . Calcium-Magnesium-Zinc 167-83-8 MG TABS Take 1 tablet by mouth daily.    . Multiple Vitamin (MULTIVITAMIN) tablet Take 1 tablet by mouth daily.    . TURMERIC PO Take 2 tablets by mouth daily.     . Vitamins C E (VITAMIN C & E COMBINATION PO) Take 1 capsule by mouth daily.     No current facility-administered medications on file prior to visit.     BP (!) 142/90 (BP Location: Right Arm, Cuff Size: Normal)   Pulse (!) 101   Temp 98.3 F (36.8 C) (Oral)   Resp 18   Ht 5' 8.5" (1.74 m)   Wt 178 lb 6.4 oz (80.9 kg)   SpO2 98%   BMI 26.73 kg/m       Objective:   Physical Exam Physical Exam  Constitutional: He is oriented to person,  place, and time. He appears well-developed and well-nourished. No distress.  HENT:  Head: Normocephalic and atraumatic.  Right Ear: Tympanic membrane and ear canal normal.  Left Ear: Tympanic membrane and ear canal normal.  Mouth/Throat: Oropharynx is clear and moist.  Eyes: Pupils are equal, round, and reactive to light. No scleral icterus.  Neck: Normal range of motion. No thyromegaly present.  Cardiovascular: Normal rate and regular rhythm.   No murmur heard. Pulmonary/Chest: Effort normal and breath sounds normal. No respiratory distress. He has no wheezes. He has no rales. He exhibits no tenderness.  Abdominal: Soft. Bowel sounds are normal. He exhibits no distension and no mass. There is no tenderness. There is no rebound and no guarding.  Musculoskeletal: He exhibits no edema.  Lymphadenopathy:    He has no cervical adenopathy.  Neurological: He is alert and oriented to  person, place, and time. He has normal patellar reflexes. He exhibits normal muscle tone. Coordination normal.  Skin: Skin is warm and dry.  Psychiatric: He has a normal mood and affect. His behavior is normal. Judgment and thought content normal.           Assessment & Plan:        Assessment & Plan:  EKG tracing is personally reviewed.  EKG notes NSR with non-specific T wave abnormality.   No acute changes. Compared to previous EKG on file and appears unchanged. Had neg cardiac work up in 2016.   Preventative Care- discussed healthy diet, regular exercise and weight loss.    Hearing loss- will go to [facility] for free hearing exam.  Concerned about money so he is unable to schedule routine dental, skin, vision.  HTN- uncontrolled. Increase amlodipine from 2.5mg  to 5mg  once daily.

## 2016-12-31 NOTE — Patient Instructions (Addendum)
Please schedule an appointment with dermatology for skin check.  Complete lab work prior prior to leaving. Schedule eye exam and dental exam when you can and try to get a hearing check at [facility]. Increase amlodipine to 5 mg once daily.

## 2017-01-02 ENCOUNTER — Other Ambulatory Visit (INDEPENDENT_AMBULATORY_CARE_PROVIDER_SITE_OTHER): Payer: BLUE CROSS/BLUE SHIELD

## 2017-01-02 DIAGNOSIS — D582 Other hemoglobinopathies: Secondary | ICD-10-CM | POA: Diagnosis not present

## 2017-01-02 LAB — FERRITIN: Ferritin: 188.1 ng/mL (ref 22.0–322.0)

## 2017-01-02 LAB — HEMOGLOBIN A1C: Hgb A1c MFr Bld: 5.4 % (ref 4.6–6.5)

## 2017-01-02 LAB — IRON: Iron: 178 ug/dL — ABNORMAL HIGH (ref 42–165)

## 2017-01-03 ENCOUNTER — Other Ambulatory Visit: Payer: Self-pay | Admitting: Family

## 2017-01-03 DIAGNOSIS — E785 Hyperlipidemia, unspecified: Secondary | ICD-10-CM

## 2017-01-03 NOTE — Telephone Encounter (Signed)
Please contact patient him know that I have reviewed his lab work.  His blood count and his iron levels are elevated.  He may have a disorder of his iron metabolism.  I would like for him to return to the lab for some additional testing which is pending.  If he is taking any vitamins that have iron in them he should discontinue.  Also his cholesterol and triglycerides are quite high.  I would like for him to work on a low-fat low-cholesterol diet.  Also work on avoiding concentrated sweets and limiting his white fluffy carbs.  I would like for him to begin a atorvastatin 20 mg once daily.  Follow-up in 6 weeks for a fasting lipid panel.

## 2017-01-07 NOTE — Telephone Encounter (Signed)
Cologuard has been ordered. 

## 2017-01-07 NOTE — Telephone Encounter (Signed)
Attempted to reach pt No answer, no voicemail 

## 2017-01-09 MED ORDER — ATORVASTATIN CALCIUM 20 MG PO TABS: 20.0000 mg | ORAL_TABLET | Freq: Every day | ORAL | 5 refills | Status: DC

## 2017-01-09 NOTE — Telephone Encounter (Signed)
Notified pt and he voices understanding. Lab appt scheduled for 01/11/17 for iron work up. Lab appt scheduled for 02/20/17 for repeat lipid panel. Rx sent. Future lab orders entered.

## 2017-01-11 ENCOUNTER — Other Ambulatory Visit (INDEPENDENT_AMBULATORY_CARE_PROVIDER_SITE_OTHER): Payer: BLUE CROSS/BLUE SHIELD

## 2017-01-17 LAB — HEMOCHROMATOSIS DNA-PCR(C282Y,H63D)

## 2017-01-20 ENCOUNTER — Telehealth: Payer: Self-pay | Admitting: Family

## 2017-01-20 DIAGNOSIS — E785 Hyperlipidemia, unspecified: Secondary | ICD-10-CM

## 2017-01-20 NOTE — Telephone Encounter (Signed)
Please contact patient and let him know that his iron level is elevated mildly.  If he is taking any iron containing supplements he should discontinue.  Also his sugar is mildly elevated but not in the diabetic range.  PSA is normal.  Cholesterol remains elevated.  Is he taking Lipitor daily?  If not he should restart Lipitor.  If he is taking regularly then I would recommend that we increase his Lipitor from 20 tp 40 mg daily..  Repeat lipid panel in 3 months.

## 2017-01-23 NOTE — Telephone Encounter (Signed)
Notified pt and he denies supplements with iron. States he did not start lipitor until 01/13/17 and has since made dietary changes cutting out sugar and cereal in the mornings and has stopped alcohol in the evenings. He will continue lipitor 20mg  and repeat lipid panel on 04/25/17 at 8:30am. Future order entered.

## 2017-01-29 ENCOUNTER — Ambulatory Visit (INDEPENDENT_AMBULATORY_CARE_PROVIDER_SITE_OTHER): Payer: BLUE CROSS/BLUE SHIELD | Admitting: Family

## 2017-01-29 VITALS — BP 136/90 | HR 65

## 2017-01-29 DIAGNOSIS — I10 Essential (primary) hypertension: Secondary | ICD-10-CM | POA: Diagnosis not present

## 2017-01-29 NOTE — Progress Notes (Signed)
Pre visit review using our clinic tool,if applicable. No additional management support is needed unless otherwise documented below in the visit note.   Patient in for BP check per order from [name] , NP  No complaints voiced today from patient.  BP today = 136/90 P=65  Per M.Osullivan patient to continue medications as ordered and return for follow up visit in 3 months.  Patient states he has appointment scheduled.

## 2017-01-30 LAB — COLOGUARD: COLOGUARD: NEGATIVE

## 2017-02-05 ENCOUNTER — Encounter: Payer: Self-pay | Admitting: Family

## 2017-02-15 ENCOUNTER — Encounter: Payer: Self-pay | Admitting: Family

## 2017-02-20 ENCOUNTER — Other Ambulatory Visit: Payer: Self-pay

## 2017-04-01 ENCOUNTER — Ambulatory Visit: Payer: Self-pay

## 2017-04-01 NOTE — Telephone Encounter (Signed)
Pt. called to discuss the benefit of a herbal tea with natural pseudoephedrine, over the Sudafed.  Reported he bought Corcidin HBP, instead of the Sudafed, to prevent any problems with his blood pressure.  Stated that he has the tea and questioned if it is safer to use than the Sudafed.  Stated he drank one cup, and feels better.  Denied fever.  Reported nasal congestion and a sore throat started on Saturday.  Reported "a little cough this morning", when he first got up.  Stated it has subsided now.  Advised he can consult with his Pharmacist about the comparison of the natural Pseudoephidrine and Sudafed. Home care advice given per protocol.  Verb. Understanding; agreed to call the Pharmacist with his question.   Reason for Disposition . Cold with no complications  Answer Assessment - Initial Assessment Questions 1. ONSET: "When did the nasal discharge start?"      Saturday 2. AMOUNT: "How much discharge is there?"      Frequent nasal drainage 3. COUGH: "Do you have a cough?" If yes, ask: "Describe the color of your sputum" (clear, white, yellow, green)     A little cough when I first woke up 4. RESPIRATORY DISTRESS: "Describe your breathing."      Normal breathing 5. FEVER: "Do you have a fever?" If so, ask: "What is your temperature, how was it measured, and when did it start?"     no 6. SEVERITY: "Overall, how bad are you feeling right now?" (e.g., doesn't interfere with normal activities, staying home from school/work, staying in bed)     "I feel pretty good; it's in my head."  7. OTHER SYMPTOMS: "Do you have any other symptoms?" (e.g., sore throat, earache, wheezing, vomiting)    Sore throat  8. PREGNANCY: "Is there any chance you are pregnant?" "When was your last menstrual period?"    N/a  Protocols used: COMMON COLD-A-AH

## 2017-04-08 ENCOUNTER — Telehealth: Payer: Self-pay

## 2017-04-08 NOTE — Telephone Encounter (Signed)
Pt states that he's been billed multiple times for one visit and doesn't understand why or how. Pt states that he's on a fixed income and although it's sometimes hard he doesn't mind paying the bill, however after paying on the bill once or twice more bills from that single visit keep coming in. Pt states he has the bills in hand and would like to go over them with someone in person so they can fix the problem or he can better understand what's going on. I advised that the Pt call billing and coding but Pt states that he didn't "want to talk to someone way on [place], they're in [place] and I need to speak with someone face to face". I made it clear to Pt that I could not promise him a meeting with anyone face to face however I would forward all information from our conversation to the office manager [place], to reach out to him when she got the chance. Pt seemed pleased with that idea. Pt seems pleasant just somewhat confused about how the billing was done and why. Pt advised me to tell anyone who calls to please leave a detailed message because he doesn't answer calls from numbers he's not familiar with. I told Pt that I would note that and pass the message along.

## 2017-04-12 NOTE — Telephone Encounter (Signed)
This BP check is being denied by patients insurance as not medically necessary. I have sent for coding review and will follow up with the patient when I know more.

## 2017-04-25 ENCOUNTER — Other Ambulatory Visit (INDEPENDENT_AMBULATORY_CARE_PROVIDER_SITE_OTHER): Payer: BLUE CROSS/BLUE SHIELD

## 2017-04-25 DIAGNOSIS — E785 Hyperlipidemia, unspecified: Secondary | ICD-10-CM

## 2017-04-25 LAB — LIPID PANEL
Cholesterol: 159 mg/dL (ref 0–200)
HDL: 44.7 mg/dL (ref >39.00)
NonHDL: 114.71
Total CHOL/HDL Ratio: 4
Triglycerides: 203 mg/dL — ABNORMAL HIGH (ref 0.0–149.0)
VLDL: 40.6 mg/dL — AB (ref 0.0–40.0)

## 2017-04-25 LAB — LDL CHOLESTEROL, DIRECT: Direct LDL: 94 mg/dL

## 2017-04-26 ENCOUNTER — Telehealth: Payer: Self-pay | Admitting: Family

## 2017-04-26 MED ORDER — AMLODIPINE BESYLATE 5 MG PO TABS: 5.0000 mg | ORAL_TABLET | Freq: Every day | ORAL | 5 refills | Status: DC

## 2017-04-26 MED ORDER — FISH OIL 1000 MG PO CPDR: DELAYED_RELEASE_CAPSULE | ORAL | Status: AC

## 2017-04-26 NOTE — Telephone Encounter (Signed)
Letter has been mailed to pt. Refill sent to pharmacy.

## 2017-04-26 NOTE — Telephone Encounter (Signed)
Please contact patient and let him know that his cholesterol has improved significantly.  His triglycerides are improved but still slightly elevated.  I would recommend that he continue the current dose of atorvastatin and add fish oil 3000 mg twice daily.  He should continue to work on avoiding concentrated sweets and limiting white fluffy carbs in his diet.

## 2017-04-26 NOTE — Telephone Encounter (Signed)
Attempted to reach pt but received no answer, no voicemail.  Mailed letter.

## 2017-04-26 NOTE — Telephone Encounter (Signed)
Pt. Jose Strong was returning a call he thinks about lab results. Please call him back

## 2017-04-26 NOTE — Telephone Encounter (Signed)
Copied from [place] [phone]. Topic: Quick Communication - Rx Refill/Question >> Apr 26, 2017  2:01 PM [name] wrote:  Medication: amLODipine (NORVASC) 5 MG tablet   Has the patient contacted their pharmacy? No.  He just picked up prescriptions but no more refills.    (Agent: If no, request that the patient contact the pharmacy for the refill.)   Preferred Pharmacy (with phone number or street name): [facility] - [place], [place] - [address] AT [place] [address] [place] [address] Phone: [phone] Fax: [phone]  Agent: Please be advised that RX refills may take up to 3 business days. We ask that you follow-up with your pharmacy.

## 2017-05-06 ENCOUNTER — Telehealth: Payer: Self-pay | Admitting: Family

## 2017-05-06 NOTE — Telephone Encounter (Signed)
Patient came into office to discuss bills. Patient has questions about exact sciences

## 2017-05-06 NOTE — Telephone Encounter (Signed)
Advised patient that I would have Gilmore Laroche look at it & see why the colorectal scr was not covered. [employer] telephone number [phone]. Please advise patient

## 2017-05-07 NOTE — Telephone Encounter (Signed)
Notified pt and he voices understanding. He will contact his insurance and proceed as below if needed.  Spoke with Rep at [facility] and was advised that they have received no response from [place] in 45 days and their policy is to send statement to pt if insurance has not responded in that time frame. She looked into claim # [id number] and it says it was processed on 02/27/17; no payment made. Claim finalized. She encourages pt to contact his insurance for explanation of claim. If insurance denies payment for claim pt will need to contact [employer] at below # and request to start an appeals process.

## 2017-08-08 ENCOUNTER — Telehealth: Payer: Self-pay | Admitting: Family

## 2017-08-08 NOTE — Telephone Encounter (Signed)
Copied from [place] [phone]. Topic: Quick Communication - See Telephone Encounter >> Aug 08, 2017 11:51 AM Rosalin Hawking wrote: CRM for notification. See Telephone encounter for: 08/08/17.   Pt came in office stating that has changed his insurance to [place] and that [facility] is requiring [facility], pt would like to have changed [facility] pharmacy [facility] [address], [place] [address]. [facility] but now insurance states needed to have [facility]. Pt will pick up his last rx for Amlodipine at [facility] but is needing refill on Atorvastatin 20 mg [facility] - if possible 90 day supply since insurance will cover the 90 day supply. Please advise.

## 2017-08-09 MED ORDER — ATORVASTATIN CALCIUM 20 MG PO TABS: 20.0000 mg | ORAL_TABLET | Freq: Every day | ORAL | 0 refills | Status: DC

## 2017-08-09 NOTE — Telephone Encounter (Signed)
Pharmacy changed and [facility]. Pt is past due for follow up with Melissa and mychart message has been sent.

## 2017-08-13 ENCOUNTER — Other Ambulatory Visit: Payer: Self-pay | Admitting: Family

## 2017-08-27 ENCOUNTER — Ambulatory Visit: Payer: BLUE CROSS/BLUE SHIELD | Admitting: Family

## 2017-08-27 ENCOUNTER — Telehealth: Payer: Self-pay | Admitting: *Deleted

## 2017-08-27 ENCOUNTER — Encounter: Payer: Self-pay | Admitting: Family

## 2017-08-27 VITALS — BP 112/76 | HR 71 | Temp 98.2°F | Resp 16 | Ht 68.5 in | Wt 177.0 lb

## 2017-08-27 DIAGNOSIS — I10 Essential (primary) hypertension: Secondary | ICD-10-CM

## 2017-08-27 DIAGNOSIS — E785 Hyperlipidemia, unspecified: Secondary | ICD-10-CM

## 2017-08-27 LAB — LIPID PANEL
CHOL/HDL RATIO: 3
CHOLESTEROL: 151 mg/dL (ref 0–200)
HDL: 56.7 mg/dL (ref >39.00)
LDL CALC: 75 mg/dL (ref 0–99)
NonHDL: 94.26
Triglycerides: 97 mg/dL (ref 0.0–149.0)
VLDL: 19.4 mg/dL (ref 0.0–40.0)

## 2017-08-27 LAB — BASIC METABOLIC PANEL
BUN: 14 mg/dL (ref 6–23)
CHLORIDE: 104 meq/L (ref 96–112)
CO2: 25 meq/L (ref 19–32)
CREATININE: 0.74 mg/dL (ref 0.40–1.50)
Calcium: 9.5 mg/dL (ref 8.4–10.5)
GFR: 112.85 mL/min (ref >60.00)
Glucose, Bld: 105 mg/dL — ABNORMAL HIGH (ref 70–99)
POTASSIUM: 4.3 meq/L (ref 3.5–5.1)
Sodium: 139 mEq/L (ref 135–145)

## 2017-08-27 MED ORDER — AMLODIPINE BESYLATE 5 MG PO TABS: 5.0000 mg | ORAL_TABLET | Freq: Every day | ORAL | 1 refills | Status: DC

## 2017-08-27 NOTE — Progress Notes (Signed)
Subjective:    Patient ID: Jose Strong, male    DOB: 1951-02-04, 67 y.o.   MRN: [id number]  HPI   Patient is a 67 yr old male who presents today for follow up:  HTN- he is maintained on amlodipine 5mg  once daily. Reports that he feels less winded since his blood pressure has been better controlled. Denies LE edema  Wt Readings from Last 3 Encounters:  08/27/17 177 lb (80.3 kg)  12/31/16 178 lb 6.4 oz (80.9 kg)  11/13/16 177 lb (80.3 kg)     BP Readings from Last 3 Encounters:  01/29/17 136/90  12/31/16 (!) 142/100  11/13/16 (!) 145/88   Hyperlipidemia- maintained on lipitor and fish oil. Reports that he has been working on his diet. Lab Results  Component Value Date   CHOL 159 04/25/2017   HDL 44.70 04/25/2017   LDLCALC 164 (H) 08/10/2016   LDLDIRECT 94.0 04/25/2017   TRIG 203.0 (H) 04/25/2017   CHOLHDL 4 04/25/2017        Review of Systems See HPI  Past Medical History:  Diagnosis Date  . Cancer [facility]) 07/04/2009   melanoma --neck     Social History   Socioeconomic History  . Marital status: Widowed    Spouse name: Not on file  . Number of children: 0  . Years of education: Not on file  . Highest education level: Not on file  Occupational History  . Not on file  Social Needs  . Financial resource strain: Not on file  . Food insecurity:    Worry: Not on file    Inability: Not on file  . Transportation needs:    Medical: Not on file    Non-medical: Not on file  Tobacco Use  . Smoking status: Former Research scientist (life sciences)  . Smokeless tobacco: Never Used  Substance and Sexual Activity  . Alcohol use: Yes    Comment: 2-4 beers per day  . Drug use: Yes    Types: Amyl nitrate  . Sexual activity: Not on file  Lifestyle  . Physical activity:    Days per week: Not on file    Minutes per session: Not on file  . Stress: Not on file  Relationships  . Social connections:    Talks on phone: Not on file    Gets together: Not on file    Attends religious service: Not on  file    Active member of club or organization: Not on file    Attends meetings of clubs or organizations: Not on file    Relationship status: Not on file  . Intimate partner violence:    Fear of current or ex partner: Not on file    Emotionally abused: Not on file    Physically abused: Not on file    Forced sexual activity: Not on file  Other Topics Concern  . 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     He does not have children   Enjoys fishing/golf, recently caring for his wife.      Past Surgical History:  Procedure Laterality Date  . [place]  07-04-2009  . TONSILLECTOMY  1964  . TONSILLECTOMY      Family History  Problem Relation Age of Onset  . COPD Mother   . Angina Mother     No Known Allergies  Current Outpatient  Medications on File Prior to Visit  Medication Sig Dispense Refill  . atorvastatin (LIPITOR) 20 MG tablet Take 1 tablet (20 mg total) by mouth daily. 90 tablet 0  . B Complex-Folic Acid (B COMPLEX PLUS PO) Take 1 tablet by mouth daily.    . Calcium-Magnesium-Zinc 167-83-8 MG TABS Take 1 tablet by mouth daily.    . Multiple Vitamin (MULTIVITAMIN) tablet Take 1 tablet by mouth daily.    . Omega-3 Fatty Acids (FISH OIL) 1000 MG CPDR 3000 mg by mouth twice daily.    . TURMERIC PO Take 2 tablets by mouth daily.     . Vitamins C E (VITAMIN C & E COMBINATION PO) Take 1 capsule by mouth daily.     No current facility-administered medications on file prior to visit.     BP 112/76 (BP Location: Right Arm, Cuff Size: Normal)   Pulse 71   Temp 98.2 F (36.8 C) (Oral)   Resp 16   Ht 5' 8.5" (1.74 m)   Wt 177 lb (80.3 kg)   SpO2 100%   BMI 26.52 kg/m       Objective:   Physical Exam  Constitutional: He is oriented to person, place, and time. He appears well-developed and well-nourished. No distress.  HENT:  Head: Normocephalic and atraumatic.    Cardiovascular: Normal rate and regular rhythm.  No murmur heard. Pulmonary/Chest: Effort normal and breath sounds normal. No respiratory distress. He has no wheezes. He has no rales.  Musculoskeletal: He exhibits no edema.  Neurological: He is alert and oriented to person, place, and time.  Skin: Skin is warm and dry.  Psychiatric: He has a normal mood and affect. His behavior is normal. Thought content normal.          Assessment & Plan:  HTN- bp stable, continue current meds. Obtain bmet.   Hyperlipidemia- has improved diet, tolerating statin. Obtain follow up lipid panel.

## 2017-08-27 NOTE — Patient Instructions (Signed)
Please complete lab work prior to leaving. Continue to work on low sodium/low cholesterol diet and limiting concentrated sweets.

## 2017-08-27 NOTE — Telephone Encounter (Signed)
Noted  

## 2017-08-27 NOTE — Telephone Encounter (Signed)
Pt dropped off letter from the [employer] explaining testing procedure for HCV screening and to contact [phone] for results or more information. Called number and was told that pt would need to call and give consent to release this information to PCP. Letter forwarded to PCP, mychart message will be sent to pt.

## 2017-08-28 ENCOUNTER — Telehealth: Payer: Self-pay | Admitting: Family

## 2017-08-28 NOTE — Telephone Encounter (Signed)
Copied from [place]. Topic: Quick Communication - See Telephone Encounter >> Aug 28, 2017  1:20 PM Rosalin Hawking wrote: CRM for notification. See Telephone encounter for: 08/28/17.     Pt dropped off document for provider to have on pt's chart and for provider to see ( small white envelope) Document put in front office tray under providers name.

## 2017-09-02 NOTE — Telephone Encounter (Signed)
Forwarded to provider [no envelope]/SLS 06/24

## 2017-09-07 ENCOUNTER — Telehealth: Payer: Self-pay | Admitting: Family

## 2017-09-07 DIAGNOSIS — R768 Other specified abnormal immunological findings in serum: Secondary | ICD-10-CM

## 2017-09-07 NOTE — Telephone Encounter (Signed)
I [facility]. It shows + hep C antibodies.  I would like to repeat hep C testing with viral load testing. My suspicion is that he was exposed to hep C remotely but no longer has the virus.

## 2017-09-11 NOTE — Telephone Encounter (Signed)
Pt called back stating he spoke with his insurance and was told that he has no co-pay for labs but they are unable to tell him what specific tests are covered and that his PCP would have to call and verify coverage. I advised pt that we do not verify lab coverage and his insurance should be able to tell him that information as well. I apologized to pt if that was all the information that his insurance was able to give him but we do not verify lab test coverage. I advised pt that he has a medical need /diagnosis for the additional tests needed and these are not routine screening tests and he voices understanding. I asked pt if he was told if he has a deductible for labs and he states he was just told he has no co-pay. I asked pt if insurance requires a specific lab and he states none were specified.  Pt states [employer] went into effect on 09/09/17 and he will bring copy of new insurance to the office. Lab appt scheduled for 09/17/17 at 9:30am and future labs have been entered.

## 2017-09-11 NOTE — Telephone Encounter (Signed)
Notified pt. He states that his insurance has changed to [employer] and it doesn't cover as well as previous insurance did. He would like to check with his insurance before scheduling a lab appointment so he can check lab costs. Lab test names given to pt and he will call us back and let us know if he is able to proceed with additional testing. I will check back with pt at the end of next week if he has not called [place] back.

## 2017-09-17 ENCOUNTER — Encounter: Payer: Self-pay | Admitting: Family

## 2017-09-17 ENCOUNTER — Other Ambulatory Visit (INDEPENDENT_AMBULATORY_CARE_PROVIDER_SITE_OTHER): Payer: Medicare HMO

## 2017-09-17 DIAGNOSIS — R768 Other specified abnormal immunological findings in serum: Secondary | ICD-10-CM | POA: Diagnosis not present

## 2017-09-19 ENCOUNTER — Encounter: Payer: Self-pay | Admitting: Family

## 2017-09-19 LAB — HEPATITIS C RNA QUANTITATIVE
HCV QUANT LOG: NOT DETECTED {Log_IU}/mL
HCV RNA, PCR, QN: NOT DETECTED [IU]/mL

## 2017-09-19 LAB — HEPATITIS C ANTIBODY
Hepatitis C Ab: NONREACTIVE
SIGNAL TO CUT-OFF: 0.06 (ref <1.00)

## 2017-10-30 ENCOUNTER — Other Ambulatory Visit: Payer: Self-pay | Admitting: Family

## 2017-11-19 ENCOUNTER — Ambulatory Visit: Payer: Medicare HMO | Admitting: *Deleted

## 2017-12-12 DIAGNOSIS — H52209 Unspecified astigmatism, unspecified eye: Secondary | ICD-10-CM | POA: Diagnosis not present

## 2017-12-12 DIAGNOSIS — H5203 Hypermetropia, bilateral: Secondary | ICD-10-CM | POA: Diagnosis not present

## 2017-12-12 DIAGNOSIS — H524 Presbyopia: Secondary | ICD-10-CM | POA: Diagnosis not present

## 2018-01-08 ENCOUNTER — Encounter: Payer: Self-pay | Admitting: Family

## 2018-01-08 ENCOUNTER — Ambulatory Visit (INDEPENDENT_AMBULATORY_CARE_PROVIDER_SITE_OTHER): Payer: Medicare HMO | Admitting: Family

## 2018-01-08 VITALS — BP 132/78 | HR 61 | Temp 98.1°F | Resp 16 | Ht 69.0 in | Wt 179.0 lb

## 2018-01-08 DIAGNOSIS — Z23 Encounter for immunization: Secondary | ICD-10-CM | POA: Diagnosis not present

## 2018-01-08 DIAGNOSIS — R202 Paresthesia of skin: Secondary | ICD-10-CM | POA: Diagnosis not present

## 2018-01-08 LAB — B12 AND FOLATE PANEL
Folate: >23.9 ng/mL (ref >5.9)
VITAMIN B 12: 580 pg/mL (ref 211–911)

## 2018-01-08 MED ORDER — AMLODIPINE BESYLATE 5 MG PO TABS: 5.0000 mg | ORAL_TABLET | Freq: Every day | ORAL | 1 refills | Status: DC

## 2018-01-08 NOTE — Patient Instructions (Signed)
Please complete lab work prior to leaving. Check coverage of colonoscopy with your insurance and let me know if you would like to proceed.

## 2018-01-08 NOTE — Progress Notes (Signed)
Subjective:   Jose Strong is a 67 y.o. male who presents for an Initial Medicare Annual Wellness Visit.  HTN- maintained amlodipine. Denies swelling.  BP Readings from Last 3 Encounters:  01/08/18 132/78  08/27/17 112/76  01/29/17 136/90   Hyperlipidemia- denies myalgia Lab Results  Component Value Date   CHOL 151 08/27/2017   HDL 56.70 08/27/2017   LDLCALC 75 08/27/2017   LDLDIRECT 94.0 04/25/2017   TRIG 97.0 08/27/2017   CHOLHDL 3 08/27/2017      Objective:    Today's Vitals   01/08/18 0955  BP: 132/78  Pulse: 61  Resp: 16  Temp: 98.1 F (36.7 C)  TempSrc: Oral  SpO2: 100%  Weight: 179 lb (81.2 kg)  Height: 5\' 9"  (1.753 m)   Body mass index is 26.43 kg/m.  Advanced Directives 04/03/2014  Does Patient Have a Medical Advance Directive? No  Would patient like information on creating a medical advance directive? No - patient declined information    Current Medications (verified) Outpatient Encounter Medications as of 01/08/2018  Medication Sig  . amLODipine (NORVASC) 5 MG tablet Take 1 tablet (5 mg total) by mouth daily.  Marland Kitchen atorvastatin (LIPITOR) 20 MG tablet TAKE 1 TABLET BY MOUTH EVERY DAY PT NEEDS APPOINTMENT/ REFILLS  . B Complex-Folic Acid (B COMPLEX PLUS PO) Take 1 tablet by mouth daily.  . Calcium-Magnesium-Zinc 167-83-8 MG TABS Take 1 tablet by mouth daily.  . Multiple Vitamin (MULTIVITAMIN) tablet Take 1 tablet by mouth daily.  . Omega-3 Fatty Acids (FISH OIL) 1000 MG CPDR 3000 mg by mouth twice daily.  . TURMERIC PO Take 2 tablets by mouth daily.   . Vitamins C E (VITAMIN C & E COMBINATION PO) Take 1 capsule by mouth daily.   No facility-administered encounter medications on file as of 01/08/2018.     Allergies (verified) Patient has no known allergies.   History: Past Medical History:  Diagnosis Date  . Cancer [facility]) 2011   melanoma --neck   Past Surgical History:  Procedure Laterality Date  . [place]  2011  . TONSILLECTOMY  1964    . TONSILLECTOMY     Family History  Problem Relation Age of Onset  . COPD Mother   . Angina Mother    Social History   Socioeconomic History  . Marital status: Widowed    Spouse name: Not on file  . Number of children: 0  . Years of education: Not on file  . Highest education level: Not on file  Occupational History  . Not on file  Social Needs  . Financial resource strain: Not on file  . Food insecurity:    Worry: Not on file    Inability: Not on file  . Transportation needs:    Medical: Not on file    Non-medical: Not on file  Tobacco Use  . Smoking status: Former Research scientist (life sciences)  . Smokeless tobacco: Never Used  Substance and Sexual Activity  . Alcohol use: Yes    Comment: 2-4 beers per day  . Drug use: Yes    Types: Amyl nitrate  . Sexual activity: Not on file  Lifestyle  . Physical activity:    Days per week: Not on file    Minutes per session: Not on file  . Stress: Not on file  Relationships  . Social connections:    Talks on phone: Not on file    Gets together: Not on file    Attends religious service: Not on file  Active member of club or organization: Not on file    Attends meetings of clubs or organizations: Not on file    Relationship status: Not on file  Other Topics Concern  . 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     He does not have children   Enjoys fishing/golf, recently caring for his wife.     Tobacco Counseling Counseling given: Not Answered   Clinical Intake:                       Activities of Daily Living No flowsheet data found.   Immunizations and Health Maintenance Immunization History  Administered Date(s) Administered  . Influenza, High Dose Seasonal PF 01/08/2018  . Influenza,inj,Quad PF,6+ Mos 12/08/2012, 12/08/2013, 12/27/2015, 12/31/2016  . Td 02/10/2016  . Tdap 03/12/2006  . Zoster 12/08/2013    Health Maintenance Due  Topic Date Due  . PNA vac Low Risk Adult (1 of 2 - PCV13) 09/23/2017    Patient Care Team: [name], NP as PCP - General (Internal Medicine)  Indicate any recent Medical Services you may have received from other than [facility] providers in the past year (date may be approximate).    Assessment:   This is a routine wellness examination for Jose Strong.  Hearing/Vision screen  Visual Acuity Screening   Right eye Left eye Both eyes  Without correction: 20/30 20/25 20/25   With correction:       Dietary issues and exercise activities discussed:    Goals   None    Depression Screen PHQ 2/9 Scores 02/10/2016 12/27/2015  PHQ - 2 Score 0 0    Fall Risk Fall Risk  02/10/2016  Falls in the past year? No    Is the patient's home free of loose throw rugs in walkways, pet beds, electrical cords, etc?   yes      Grab bars in the bathroom? no      Handrails on the stairs?   no stairs      Adequate lighting?   yes  Timed Get Up and Go performed:   Cognitive Function:        Screening Tests Health Maintenance  Topic Date Due  . PNA vac Low Risk Adult (1 of 2 - PCV13) 09/23/2017  . COLONOSCOPY  08/28/2018 (Originally 09/24/2002)  . HIV Screening  08/28/2018 (Originally 09/24/1967)  . TETANUS/TDAP  02/09/2026  . INFLUENZA VACCINE  Completed  . Hepatitis C Screening  Completed    Qualifies for Shingles Vaccine? Yes but declines  Cancer Screenings: Lung: Low Dose CT Chest recommended if Age 71-80 years, 30 pack-year currently smoking OR have quit w/in 15years. Patient does not qualify. (20 yr pack history) quit in the 90's.  Colorectal: 06/25/2016, negative.  Will check with insurer re: colonoscopy and let me now if he would like to proceed.   Additional Screenings: none Hepatitis C Screening:       Plan:    up to date  I have personally reviewed and noted the following in the patient's chart:   . Medical and social history . Use of alcohol,  tobacco or illicit drugs  . Current medications and supplements . Functional ability and status . Nutritional status . Physical activity . Advanced directives . List of other physicians . Hospitalizations, surgeries, and ER visits in previous 12 months .  Vitals . Screenings to include cognitive, depression, and falls . Referrals and appointments  In addition, I have reviewed and discussed with patient certain preventive protocols, quality metrics, and best practice recommendations. A written personalized care plan for preventive services as well as general preventive health recommendations were provided to patient.     Nance Pear, NP   01/08/2018      Patient ID: Ellen Henri, male   DOB: 1951/03/01, 67 y.o.   MRN: [id number]

## 2018-01-24 DIAGNOSIS — M5413 Radiculopathy, cervicothoracic region: Secondary | ICD-10-CM | POA: Diagnosis not present

## 2018-01-24 DIAGNOSIS — M9901 Segmental and somatic dysfunction of cervical region: Secondary | ICD-10-CM | POA: Diagnosis not present

## 2018-01-24 DIAGNOSIS — M5417 Radiculopathy, lumbosacral region: Secondary | ICD-10-CM | POA: Diagnosis not present

## 2018-01-24 DIAGNOSIS — M9903 Segmental and somatic dysfunction of lumbar region: Secondary | ICD-10-CM | POA: Diagnosis not present

## 2018-01-24 DIAGNOSIS — M62830 Muscle spasm of back: Secondary | ICD-10-CM | POA: Diagnosis not present

## 2018-02-18 ENCOUNTER — Encounter: Payer: Self-pay | Admitting: Family

## 2018-02-18 ENCOUNTER — Ambulatory Visit (INDEPENDENT_AMBULATORY_CARE_PROVIDER_SITE_OTHER): Payer: Medicare HMO | Admitting: Family

## 2018-02-18 VITALS — BP 150/85 | HR 68 | Temp 98.3°F | Resp 16 | Ht 69.0 in | Wt 180.0 lb

## 2018-02-18 DIAGNOSIS — I10 Essential (primary) hypertension: Secondary | ICD-10-CM

## 2018-02-18 DIAGNOSIS — E785 Hyperlipidemia, unspecified: Secondary | ICD-10-CM | POA: Diagnosis not present

## 2018-02-18 DIAGNOSIS — R21 Rash and other nonspecific skin eruption: Secondary | ICD-10-CM

## 2018-02-18 LAB — COMPREHENSIVE METABOLIC PANEL
ALT: 26 U/L (ref 0–53)
AST: 21 U/L (ref 0–37)
Albumin: 4.6 g/dL (ref 3.5–5.2)
Alkaline Phosphatase: 59 U/L (ref 39–117)
BILIRUBIN TOTAL: 0.8 mg/dL (ref 0.2–1.2)
BUN: 13 mg/dL (ref 6–23)
CHLORIDE: 103 meq/L (ref 96–112)
CO2: 27 meq/L (ref 19–32)
Calcium: 9.5 mg/dL (ref 8.4–10.5)
Creatinine, Ser: 0.73 mg/dL (ref 0.40–1.50)
GFR: 114.46 mL/min (ref >60.00)
Glucose, Bld: 103 mg/dL — ABNORMAL HIGH (ref 70–99)
POTASSIUM: 4 meq/L (ref 3.5–5.1)
Sodium: 139 mEq/L (ref 135–145)
Total Protein: 7 g/dL (ref 6.0–8.3)

## 2018-02-18 LAB — LIPID PANEL
CHOL/HDL RATIO: 3
Cholesterol: 156 mg/dL (ref 0–200)
HDL: 57.6 mg/dL (ref >39.00)
LDL Cholesterol: 72 mg/dL (ref 0–99)
NONHDL: 98.21
TRIGLYCERIDES: 132 mg/dL (ref 0.0–149.0)
VLDL: 26.4 mg/dL (ref 0.0–40.0)

## 2018-02-18 MED ORDER — CLOTRIMAZOLE-BETAMETHASONE 1-0.05 % EX CREA: 1.0000 "application " | TOPICAL_CREAM | Freq: Two times a day (BID) | CUTANEOUS | 1 refills | Status: DC

## 2018-02-18 NOTE — Progress Notes (Signed)
Subjective:    Patient ID: Jose Strong, male    DOB: October 29, 1950, 67 y.o.   MRN: [id number]  HPI   Patient is a 67 yr old male who presents today with c/o rash on both legs.   BP Readings from Last 3 Encounters:  02/18/18 (!) 150/85  01/08/18 132/78  08/27/17 112/76    Hypertension-patient notes good compliance with amlodipine.  He does admit to smoking a ham over the weekend.   Review of Systems   See HPI  Past Medical History:  Diagnosis Date  . Cancer [facility]) 2009/06/23   melanoma --neck     Social History   Socioeconomic History  . Marital status: Widowed    Spouse name: Not on file  . Number of children: 0  . Years of education: Not on file  . Highest education level: Not on file  Occupational History  . Not on file  Social Needs  . Financial resource strain: Not on file  . Food insecurity:    Worry: Not on file    Inability: Not on file  . Transportation needs:    Medical: Not on file    Non-medical: Not on file  Tobacco Use  . Smoking status: Former Research scientist (life sciences)  . Smokeless tobacco: Never Used  Substance and Sexual Activity  . Alcohol use: Yes    Comment: 2-4 beers per day  . Drug use: Yes    Types: Amyl nitrate  . Sexual activity: Not on file  Lifestyle  . Physical activity:    Days per week: Not on file    Minutes per session: Not on file  . Stress: Not on file  Relationships  . Social connections:    Talks on phone: Not on file    Gets together: Not on file    Attends religious service: Not on file    Active member of club or organization: Not on file    Attends meetings of clubs or organizations: Not on file    Relationship status: Not on file  . Intimate partner violence:    Fear of current or ex partner: Not on file    Emotionally abused: Not on file    Physically abused: Not on file    Forced sexual activity: Not on file  Other Topics Concern  . 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     He does not have children   Enjoys fishing/golf, recently caring for his wife.      Past Surgical History:  Procedure Laterality Date  . [place]  06-23-09  . TONSILLECTOMY  1964  . TONSILLECTOMY      Family History  Problem Relation Age of Onset  . COPD Mother   . Angina Mother     No Known Allergies  Current Outpatient Medications on File Prior to Visit  Medication Sig Dispense Refill  . amLODipine (NORVASC) 5 MG tablet Take 1 tablet (5 mg total) by mouth daily. 90 tablet 1  . atorvastatin (LIPITOR) 20 MG tablet TAKE 1 TABLET BY MOUTH EVERY DAY PT NEEDS APPOINTMENT/ REFILLS 90 tablet 1  . B Complex-Folic Acid (B COMPLEX PLUS PO) Take 1 tablet by mouth daily.    . Calcium-Magnesium-Zinc 167-83-8 MG TABS Take 1 tablet by mouth daily.    . Multiple Vitamin (MULTIVITAMIN) tablet Take 1 tablet by mouth daily.    Marland Kitchen  Omega-3 Fatty Acids (FISH OIL) 1000 MG CPDR 3000 mg by mouth twice daily.    . TURMERIC PO Take 2 tablets by mouth daily.     . Vitamins C E (VITAMIN C & E COMBINATION PO) Take 1 capsule by mouth daily.     No current facility-administered medications on file prior to visit.     BP (!) 150/85   Pulse 68   Temp 98.3 F (36.8 C) (Oral)   Resp 16   Ht 5\' 9"  (1.753 m)   Wt 180 lb (81.6 kg)   SpO2 100%   BMI 26.58 kg/m        Objective:   Physical Exam  Constitutional: He is oriented to person, place, and time. He appears well-developed and well-nourished. No distress.  HENT:  Head: Normocephalic and atraumatic.  Cardiovascular: Normal rate and regular rhythm.  No murmur heard. Pulmonary/Chest: Effort normal and breath sounds normal. No respiratory distress. He has no wheezes. He has no rales.  Musculoskeletal: He exhibits no edema.  Neurological: He is alert and oriented to person, place, and time.  Skin: Skin is warm and dry. Rash noted.  Psychiatric: He has a normal mood and affect.  His behavior is normal. Thought content normal.           Assessment & Plan:  HTN- BP is mildly elevated today.  I have advised the patient to check his blood pressure once daily at home for the next week and then send me his readings via my chart.  Continue current dose of amlodipine for now.  He is due for follow-up basic metabolic panel today.  Skin rash-skin appears very dry.  We discussed continuing his free and clear laundry detergent and switching his soap to an unscented undyed soap such as Cetaphil wash.  I also recommended a good emollient such as Cetaphil ointment.  I think that there may be a fungal component to his rash.  I have prescribed Lotrisone ointment to be applied twice daily to affected areas.  He is advised to call if symptoms worsen or if not improved in 2 weeks.  Hyperlipidemia- will obtain follow-up lipid panel.  Continue statin.

## 2018-02-18 NOTE — Patient Instructions (Addendum)
Switch to cetaphil body wash and cetaphil ointment daily. Apply lotrisone twice daily to affected areas. Call if symptoms worsen or if not improved in 2 weeks.

## 2018-02-26 ENCOUNTER — Encounter: Payer: Self-pay | Admitting: Family

## 2018-02-28 MED ORDER — AMLODIPINE BESYLATE 10 MG PO TABS
10.0000 mg | ORAL_TABLET | Freq: Every day | ORAL | 3 refills | Status: DC
Start: 2018-02-28 — End: 2018-05-25

## 2018-03-01 ENCOUNTER — Encounter: Payer: Self-pay | Admitting: Family

## 2018-03-27 ENCOUNTER — Encounter: Payer: Self-pay | Admitting: Family

## 2018-03-27 MED ORDER — METOPROLOL SUCCINATE ER 25 MG PO TB24: 25.0000 mg | ORAL_TABLET | Freq: Every day | ORAL | 3 refills | Status: DC

## 2018-04-11 ENCOUNTER — Encounter: Payer: Self-pay | Admitting: Family

## 2018-05-20 ENCOUNTER — Ambulatory Visit (INDEPENDENT_AMBULATORY_CARE_PROVIDER_SITE_OTHER): Payer: Medicare HMO | Admitting: Family

## 2018-05-20 ENCOUNTER — Encounter: Payer: Self-pay | Admitting: Family

## 2018-05-20 VITALS — BP 130/87 | HR 67 | Temp 98.5°F | Resp 16 | Ht 69.0 in | Wt 183.0 lb

## 2018-05-20 DIAGNOSIS — I10 Essential (primary) hypertension: Secondary | ICD-10-CM | POA: Diagnosis not present

## 2018-05-20 DIAGNOSIS — E785 Hyperlipidemia, unspecified: Secondary | ICD-10-CM

## 2018-05-20 DIAGNOSIS — R21 Rash and other nonspecific skin eruption: Secondary | ICD-10-CM

## 2018-05-20 HISTORY — DX: Essential (primary) hypertension: I10

## 2018-05-20 NOTE — Progress Notes (Signed)
Subjective:    Patient ID: Jose Strong, male    DOB: 07-Dec-1950, 68 y.o.   MRN: [id number]  HPI   Patient presents today for follow up.  HTN- on amlodipine. We added toprol xl.  Reports feeling well on current meds.   BP Readings from Last 3 Encounters:  05/20/18 130/87  02/18/18 (!) 150/85  01/08/18 132/78   Hyperlipidemia- maintained on atorvastatin 20mg .   Lab Results  Component Value Date   CHOL 156 02/18/2018   HDL 57.60 02/18/2018   LDLCALC 72 02/18/2018   LDLDIRECT 94.0 04/25/2017   TRIG 132.0 02/18/2018   CHOLHDL 3 02/18/2018   Rash- notes occasional itching. Otherwise rash has resolved.    Review of Systems See HPI    Past Medical History:  Diagnosis Date  . Cancer [facility]) 2009/07/03   melanoma --neck     Social History   Socioeconomic History  . Marital status: Widowed    Spouse name: Not on file  . Number of children: 0  . Years of education: Not on file  . Highest education level: Not on file  Occupational History  . Not on file  Social Needs  . Financial resource strain: Not on file  . Food insecurity:    Worry: Not on file    Inability: Not on file  . Transportation needs:    Medical: Not on file    Non-medical: Not on file  Tobacco Use  . Smoking status: Former Research scientist (life sciences)  . Smokeless tobacco: Never Used  Substance and Sexual Activity  . Alcohol use: Yes    Comment: 2-4 beers per day  . Drug use: Yes    Types: Amyl nitrate  . Sexual activity: Not on file  Lifestyle  . Physical activity:    Days per week: Not on file    Minutes per session: Not on file  . Stress: Not on file  Relationships  . Social connections:    Talks on phone: Not on file    Gets together: Not on file    Attends religious service: Not on file    Active member of club or organization: Not on file    Attends meetings of clubs or organizations: Not on file    Relationship status: Not on file  . Intimate partner violence:    Fear of current or ex partner: Not on  file    Emotionally abused: Not on file    Physically abused: Not on file    Forced sexual activity: Not on file  Other Topics Concern  . 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     He does not have children   Enjoys fishing/golf, recently caring for his wife.      Past Surgical History:  Procedure Laterality Date  . [place]  July 03, 2009  . TONSILLECTOMY  1964  . TONSILLECTOMY      Family History  Problem Relation Age of Onset  . COPD Mother   . Angina Mother     No Known Allergies  Current Outpatient Medications on File Prior to Visit  Medication Sig Dispense Refill  . amLODipine (NORVASC) 10 MG tablet Take 1 tablet (10 mg total) by mouth daily. 30 tablet 3  . atorvastatin (LIPITOR) 20 MG tablet TAKE 1 TABLET BY MOUTH EVERY DAY PT NEEDS APPOINTMENT/ REFILLS 90 tablet 1  .  B Complex-Folic Acid (B COMPLEX PLUS PO) Take 1 tablet by mouth daily.    . Calcium-Magnesium-Zinc 167-83-8 MG TABS Take 1 tablet by mouth daily.    . metoprolol succinate (TOPROL-XL) 25 MG 24 hr tablet Take 1 tablet (25 mg total) by mouth daily. 30 tablet 3  . Multiple Vitamin (MULTIVITAMIN) tablet Take 1 tablet by mouth daily.    . Omega-3 Fatty Acids (FISH OIL) 1000 MG CPDR 3000 mg by mouth twice daily.    . TURMERIC PO Take 2 tablets by mouth daily.     . Vitamins C E (VITAMIN C & E COMBINATION PO) Take 1 capsule by mouth daily.     No current facility-administered medications on file prior to visit.     BP 130/87 (BP Location: Right Arm, Patient Position: Sitting, Cuff Size: Small)   Pulse 67   Temp 98.5 F (36.9 C) (Oral)   Resp 16   Ht 5\' 9"  (1.753 m)   Wt 183 lb (83 kg)   SpO2 97%   BMI 27.02 kg/m    Objective:   Physical Exam Constitutional:      General: He is not in acute distress.    Appearance: He is well-developed.  HENT:     Head: Normocephalic and atraumatic.    Cardiovascular:     Rate and Rhythm: Normal rate and regular rhythm.     Heart sounds: No murmur.  Pulmonary:     Effort: Pulmonary effort is normal. No respiratory distress.     Breath sounds: Normal breath sounds. No wheezing or rales.  Skin:    General: Skin is warm and dry.  Neurological:     Mental Status: He is alert and oriented to person, place, and time.  Psychiatric:        Behavior: Behavior normal.        Thought Content: Thought content normal.           Assessment & Plan:  HTN- improved with addition of beta blocker, continue same.   Skin rash- resolved.  Hyperlipidemia- at goal, continue statin.

## 2018-05-25 ENCOUNTER — Other Ambulatory Visit: Payer: Self-pay | Admitting: Family

## 2018-05-27 ENCOUNTER — Other Ambulatory Visit: Payer: Self-pay

## 2018-05-27 MED ORDER — ATORVASTATIN CALCIUM 20 MG PO TABS: ORAL_TABLET | ORAL | 1 refills | Status: DC

## 2018-06-20 ENCOUNTER — Other Ambulatory Visit: Payer: Self-pay | Admitting: Family

## 2018-09-05 ENCOUNTER — Encounter: Payer: Self-pay | Admitting: Family

## 2018-09-10 ENCOUNTER — Ambulatory Visit (INDEPENDENT_AMBULATORY_CARE_PROVIDER_SITE_OTHER): Payer: Medicare HMO | Admitting: Family

## 2018-09-10 ENCOUNTER — Ambulatory Visit (HOSPITAL_BASED_OUTPATIENT_CLINIC_OR_DEPARTMENT_OTHER)
Admission: RE | Admit: 2018-09-10 | Discharge: 2018-09-10 | Disposition: A | Discharge status: Home / Self Care | Payer: Medicare HMO | Source: Ambulatory Visit | Attending: Family | Admitting: Family

## 2018-09-10 ENCOUNTER — Other Ambulatory Visit: Payer: Self-pay

## 2018-09-10 ENCOUNTER — Encounter: Payer: Self-pay | Admitting: Family

## 2018-09-10 ENCOUNTER — Telehealth: Payer: Self-pay | Admitting: Family

## 2018-09-10 VITALS — BP 125/76 | HR 64 | Temp 97.9°F | Resp 16 | Ht 69.0 in | Wt 177.4 lb

## 2018-09-10 DIAGNOSIS — M79672 Pain in left foot: Secondary | ICD-10-CM | POA: Diagnosis not present

## 2018-09-10 DIAGNOSIS — R5383 Other fatigue: Secondary | ICD-10-CM | POA: Diagnosis not present

## 2018-09-10 DIAGNOSIS — M79605 Pain in left leg: Secondary | ICD-10-CM

## 2018-09-10 DIAGNOSIS — I10 Essential (primary) hypertension: Secondary | ICD-10-CM

## 2018-09-10 LAB — CBC WITH DIFFERENTIAL/PLATELET
Basophils Absolute: 0.1 10*3/uL (ref 0.0–0.1)
Basophils Relative: 0.6 % (ref 0.0–3.0)
Eosinophils Absolute: 0.2 10*3/uL (ref 0.0–0.7)
Eosinophils Relative: 2.1 % (ref 0.0–5.0)
HCT: 45.6 % (ref 39.0–52.0)
Hemoglobin: 15.7 g/dL (ref 13.0–17.0)
Lymphocytes Relative: 28.6 % (ref 12.0–46.0)
Lymphs Abs: 2.8 10*3/uL (ref 0.7–4.0)
MCHC: 34.5 g/dL (ref 30.0–36.0)
MCV: 93.9 fl (ref 78.0–100.0)
Monocytes Absolute: 0.7 10*3/uL (ref 0.1–1.0)
Monocytes Relative: 7.1 % (ref 3.0–12.0)
Neutro Abs: 6 10*3/uL (ref 1.4–7.7)
Neutrophils Relative %: 61.6 % (ref 43.0–77.0)
Platelets: 207 10*3/uL (ref 150.0–400.0)
RBC: 4.85 Mil/uL (ref 4.22–5.81)
RDW: 13.2 % (ref 11.5–15.5)
WBC: 9.8 10*3/uL (ref 4.0–10.5)

## 2018-09-10 LAB — URINALYSIS, ROUTINE W REFLEX MICROSCOPIC
Bilirubin Urine: NEGATIVE
Hgb urine dipstick: NEGATIVE
Ketones, ur: NEGATIVE
Leukocytes,Ua: NEGATIVE
Nitrite: NEGATIVE
RBC / HPF: NONE SEEN (ref 0–?)
Specific Gravity, Urine: 1.015 (ref 1.000–1.030)
Total Protein, Urine: NEGATIVE
Urine Glucose: NEGATIVE
Urobilinogen, UA: 0.2 (ref 0.0–1.0)
pH: 7 (ref 5.0–8.0)

## 2018-09-10 LAB — URIC ACID: Uric Acid, Serum: 5.6 mg/dL (ref 4.0–7.8)

## 2018-09-10 LAB — BASIC METABOLIC PANEL
BUN: 12 mg/dL (ref 6–23)
CO2: 27 mEq/L (ref 19–32)
Calcium: 9.4 mg/dL (ref 8.4–10.5)
Chloride: 104 mEq/L (ref 96–112)
Creatinine, Ser: 0.75 mg/dL (ref 0.40–1.50)
GFR: 104.21 mL/min (ref >60.00)
Glucose, Bld: 107 mg/dL — ABNORMAL HIGH (ref 70–99)
Potassium: 4.8 mEq/L (ref 3.5–5.1)
Sodium: 139 mEq/L (ref 135–145)

## 2018-09-10 LAB — HEPATIC FUNCTION PANEL
ALT: 38 U/L (ref 0–53)
AST: 32 U/L (ref 0–37)
Albumin: 4.6 g/dL (ref 3.5–5.2)
Alkaline Phosphatase: 52 U/L (ref 39–117)
Bilirubin, Direct: 0.2 mg/dL (ref 0.0–0.3)
Total Bilirubin: 1 mg/dL (ref 0.2–1.2)
Total Protein: 6.9 g/dL (ref 6.0–8.3)

## 2018-09-10 LAB — TSH: TSH: 1.69 u[IU]/mL (ref 0.35–4.50)

## 2018-09-10 NOTE — Progress Notes (Signed)
Subjective:    Patient ID: Jose Strong, male    DOB: [birth year], 68 y.o.   MRN: [id number]  HPI  Patient is a 68 yr old male who presents today with chief complaint of fatigue.  Reports that about 1 month ago he wok up with "throbbing in his left foot." reports that veins were distended in his left foot. Reports that he had swelling up to mid left leg.  Had some associated numbness.  Reports that it didn't hurt to walk on the foot.  No pain since that time.  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  He also reports that he has had severe fatigue x 1 week.  Denies change in taste smell. Denies cough.   Reports his bp machine was 140/94, ours is 125/76.     BP Readings from Last 3 Encounters:  09/10/18 125/76  05/20/18 130/87  02/18/18 (!) 150/85   Denies fever, appetite is good.  Wt Readings from Last 3 Encounters:  09/10/18 177 lb 6.4 oz (80.5 kg)  05/20/18 183 lb (83 kg)  02/18/18 180 lb (81.6 kg)       Review of Systems   See HPI   Past Medical History:  Diagnosis Date  . Cancer [facility]) 06-05-2009   melanoma --neck  . Essential hypertension 05/20/2018     Social History   Socioeconomic History  . Marital status: Widowed    Spouse name: Not on file  . Number of children: 0  . Years of education: Not on file  . Highest education level: Not on file  Occupational History  . Not on file  Social Needs  . Financial resource strain: Not on file  . Food insecurity    Worry: Not on file    Inability: Not on file  . Transportation needs    Medical: Not on file    Non-medical: Not on file  Tobacco Use  . Smoking status: Former Research scientist (life sciences)  . Smokeless tobacco: Never Used  Substance and Sexual Activity  . Alcohol use: Yes    Comment: 2-4 beers per day  . Drug use: Yes    Types: Amyl nitrate  . Sexual activity: Not on file  Lifestyle  . Physical activity    Days per week: Not on file    Minutes per session: Not on file  . Stress: Not on file  Relationships  . Social Herbalist on phone: Not on file    Gets together: Not on file    Attends religious service: Not on file    Active member of club or organization: Not on file    Attends meetings of clubs or organizations: Not on file    Relationship status: Not on file  . Intimate partner violence    Fear of current or ex partner: Not on file    Emotionally abused: Not on file    Physically abused: Not on file    Forced sexual activity: Not on file  Other Topics Concern  . 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     He does not have children   Enjoys fishing/golf, recently caring for his wife.      Past Surgical History:  Procedure Laterality Date  .  [place]  2011  . TONSILLECTOMY  1964  . TONSILLECTOMY      Family History  Problem Relation Age of Onset  . COPD Mother   . Angina Mother     No Known Allergies  Current Outpatient Medications on File Prior to Visit  Medication Sig Dispense Refill  . amLODipine (NORVASC) 10 MG tablet TAKE 1 TABLET BY MOUTH EVERY DAY 90 tablet 1  . atorvastatin (LIPITOR) 20 MG tablet TAKE 1 TABLET BY MOUTH EVERY DAY 90 tablet 1  . B Complex-Folic Acid (B COMPLEX PLUS PO) Take 1 tablet by mouth daily.    . Calcium-Magnesium-Zinc 167-83-8 MG TABS Take 1 tablet by mouth daily.    . metoprolol succinate (TOPROL-XL) 25 MG 24 hr tablet TAKE 1 TABLET BY MOUTH EVERY DAY 90 tablet 1  . Multiple Vitamin (MULTIVITAMIN) tablet Take 1 tablet by mouth daily.    . Omega-3 Fatty Acids (FISH OIL) 1000 MG CPDR 3000 mg by mouth twice daily.    . TURMERIC PO Take 2 tablets by mouth daily.     . Vitamins C E (VITAMIN C & E COMBINATION PO) Take 1 capsule by mouth daily.     No current facility-administered medications on file prior to visit.     BP 125/76 (BP Location:  Right Arm, Patient Position: Sitting, Cuff Size: Small)   Pulse 64   Temp 97.9 F (36.6 C) (Oral)   Resp 16   Ht 5\' 9"  (1.753 m)   Wt 177 lb 6.4 oz (80.5 kg)   SpO2 100%   BMI 26.20 kg/m    Objective:   Physical Exam Constitutional:      General: He is not in acute distress.    Appearance: He is well-developed.  HENT:     Head: Normocephalic and atraumatic.  Cardiovascular:     Rate and Rhythm: Normal rate and regular rhythm.     Heart sounds: No murmur.  Pulmonary:     Effort: Pulmonary effort is normal. No respiratory distress.     Breath sounds: Normal breath sounds. No wheezing or rales.  Skin:    General: Skin is warm and dry.  Neurological:     Mental Status: He is alert and oriented to person, place, and time.  Psychiatric:        Behavior: Behavior normal.        Thought Content: Thought content normal.           Assessment & Plan:  HTN- bp looks good today in office. Continue current meds.  Fatigue- check CMET, CBC, UA, TSH.  LLE pain- check uric acid level to evaluate for gout and LLE doppler to rule out DVT

## 2018-09-10 NOTE — Telephone Encounter (Signed)
Called pt to review results. No voicemail, no answer.

## 2018-09-10 NOTE — Patient Instructions (Addendum)
Please call if symptoms worsen or if symptoms fail to improve.  Follow up as scheduled in September.

## 2018-09-16 ENCOUNTER — Encounter: Payer: Self-pay | Admitting: Family

## 2018-09-17 ENCOUNTER — Encounter: Payer: Self-pay | Admitting: Family

## 2018-11-16 ENCOUNTER — Other Ambulatory Visit: Payer: Self-pay | Admitting: Family

## 2018-11-18 ENCOUNTER — Encounter: Payer: Self-pay | Admitting: Family

## 2018-11-18 ENCOUNTER — Ambulatory Visit (INDEPENDENT_AMBULATORY_CARE_PROVIDER_SITE_OTHER): Payer: Medicare HMO | Admitting: Family

## 2018-11-18 ENCOUNTER — Other Ambulatory Visit: Payer: Self-pay

## 2018-11-18 VITALS — BP 116/80 | HR 75 | Temp 98.3°F | Resp 12 | Ht 69.0 in | Wt 181.4 lb

## 2018-11-18 DIAGNOSIS — Z125 Encounter for screening for malignant neoplasm of prostate: Secondary | ICD-10-CM | POA: Diagnosis not present

## 2018-11-18 DIAGNOSIS — Z23 Encounter for immunization: Secondary | ICD-10-CM

## 2018-11-18 DIAGNOSIS — I10 Essential (primary) hypertension: Secondary | ICD-10-CM | POA: Diagnosis not present

## 2018-11-18 DIAGNOSIS — M62830 Muscle spasm of back: Secondary | ICD-10-CM | POA: Diagnosis not present

## 2018-11-18 DIAGNOSIS — Z85820 Personal history of malignant melanoma of skin: Secondary | ICD-10-CM | POA: Diagnosis not present

## 2018-11-18 DIAGNOSIS — M9901 Segmental and somatic dysfunction of cervical region: Secondary | ICD-10-CM | POA: Diagnosis not present

## 2018-11-18 DIAGNOSIS — M9903 Segmental and somatic dysfunction of lumbar region: Secondary | ICD-10-CM | POA: Diagnosis not present

## 2018-11-18 DIAGNOSIS — M5417 Radiculopathy, lumbosacral region: Secondary | ICD-10-CM | POA: Diagnosis not present

## 2018-11-18 DIAGNOSIS — E785 Hyperlipidemia, unspecified: Secondary | ICD-10-CM

## 2018-11-18 DIAGNOSIS — M5413 Radiculopathy, cervicothoracic region: Secondary | ICD-10-CM | POA: Diagnosis not present

## 2018-11-18 LAB — LIPID PANEL
Cholesterol: 202 mg/dL — ABNORMAL HIGH (ref 0–200)
HDL: 50.7 mg/dL (ref >39.00)
LDL Cholesterol: 131 mg/dL — ABNORMAL HIGH (ref 0–99)
NonHDL: 151.08
Total CHOL/HDL Ratio: 4
Triglycerides: 99 mg/dL (ref 0.0–149.0)
VLDL: 19.8 mg/dL (ref 0.0–40.0)

## 2018-11-18 LAB — PSA: PSA: 3.24 ng/mL (ref 0.10–4.00)

## 2018-11-18 NOTE — Patient Instructions (Signed)
Please complete lab work prior to leaving.   

## 2018-11-18 NOTE — Progress Notes (Signed)
Subjective:    Patient ID: Jose Strong, male    DOB: June 02, 1950, 68 y.o.   MRN: [id number]  HPI  Patient presents today for follow up.  Hyperlipidemia- maintained on fish oil.  Lab Results  Component Value Date   CHOL 156 02/18/2018   HDL 57.60 02/18/2018   LDLCALC 72 02/18/2018   LDLDIRECT 94.0 04/25/2017   TRIG 132.0 02/18/2018   CHOLHDL 3 02/18/2018   Hypertension- maintained on amlodipine.   BP Readings from Last 3 Encounters:  11/18/18 116/80  09/10/18 125/76  05/20/18 130/87   Last visit he noted increased fatigue.  TSH, LFT,CBC, BMET, UA were all unremarkable.  He reports that he has been working 5 days a week.  He reports that since statin was discontinued he has no longer had leg cramping or pain.  He has been off of   He also reported + foot pain. Uric acid level was WNL. LLE doppler was negative for DVT. Reports resolution of pain off of statin.     Review of Systems See HPI  Past Medical History:  Diagnosis Date  . Cancer [facility]) 06-18-09   melanoma --neck  . Essential hypertension 05/20/2018     Social History   Socioeconomic History  . Marital status: Widowed    Spouse name: Not on file  . Number of children: 0  . Years of education: Not on file  . Highest education level: Not on file  Occupational History  . Not on file  Social Needs  . Financial resource strain: Not on file  . Food insecurity    Worry: Not on file    Inability: Not on file  . Transportation needs    Medical: Not on file    Non-medical: Not on file  Tobacco Use  . Smoking status: Former Research scientist (life sciences)  . Smokeless tobacco: Never Used  Substance and Sexual Activity  . Alcohol use: Yes    Comment: 2-4 beers per day  . Drug use: Yes    Types: Amyl nitrate  . Sexual activity: Not on file  Lifestyle  . Physical activity    Days per week: Not on file    Minutes per session: Not on file  . Stress: Not on file  Relationships  . Social Herbalist on phone: Not on file    Gets together: Not on file    Attends religious service: Not on file    Active member of club or organization: Not on file    Attends meetings of clubs or organizations: Not on file    Relationship status: Not on file  . Intimate partner violence    Fear of current or ex partner: Not on file    Emotionally abused: Not on file    Physically abused: Not on file    Forced sexual activity: Not on file  Other Topics Concern  . 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     He does not have children   Enjoys fishing/golf, recently caring for his wife.      Past Surgical History:  Procedure Laterality Date  . [place]  06-18-09  . TONSILLECTOMY  1964  . TONSILLECTOMY      Family History  Problem Relation Age of Onset  . COPD Mother   . Angina Mother     No  Known Allergies  Current Outpatient Medications on File Prior to Visit  Medication Sig Dispense Refill  . amLODipine (NORVASC) 10 MG tablet TAKE 1 TABLET BY MOUTH EVERY DAY 90 tablet 1  . B Complex-Folic Acid (B COMPLEX PLUS PO) Take 1 tablet by mouth daily.    . Calcium-Magnesium-Zinc 167-83-8 MG TABS Take 1 tablet by mouth daily.    . metoprolol succinate (TOPROL-XL) 25 MG 24 hr tablet TAKE 1 TABLET BY MOUTH EVERY DAY 90 tablet 1  . Multiple Vitamin (MULTIVITAMIN) tablet Take 1 tablet by mouth daily.    . Omega-3 Fatty Acids (FISH OIL) 1000 MG CPDR 3000 mg by mouth twice daily.    . TURMERIC PO Take 2 tablets by mouth daily.     . Vitamins C E (VITAMIN C & E COMBINATION PO) Take 1 capsule by mouth daily.     No current facility-administered medications on file prior to visit.     BP 116/80 (BP Location: Left Arm, Cuff Size: Normal)   Pulse 75   Temp 98.3 F (36.8 C) (Oral)   Resp 12   Ht 5\' 9"  (1.753 m)   Wt 181 lb 6.4 oz (82.3 kg)   SpO2 97%   BMI 26.79 kg/m       Objective:   Physical Exam  Constitutional:      General: He is not in acute distress.    Appearance: He is well-developed.  HENT:     Head: Normocephalic and atraumatic.  Cardiovascular:     Rate and Rhythm: Normal rate and regular rhythm.     Heart sounds: No murmur.  Pulmonary:     Effort: Pulmonary effort is normal. No respiratory distress.     Breath sounds: Normal breath sounds. No wheezing or rales.  Skin:    General: Skin is warm and dry.     Comments: Dry raised skin lesion behind the right ear  Neurological:     Mental Status: He is alert and oriented to person, place, and time.  Psychiatric:        Behavior: Behavior normal.        Thought Content: Thought content normal.           Assessment & Plan:  HTN- bp stable on current medication.  Hyperlipidemia- feeling better off of statin. Will repeat lipid panel.  Fatigue- resolved.  Hx of melanoma- refer to dermatology for surveillance and evaluation of lesion behind right ear.   Pt is requesting follow up PSA. Lab Results  Component Value Date   PSA 3.04 12/31/2016   PSA 3.31 12/27/2015   PSA 2.03 12/08/2012

## 2018-12-05 DIAGNOSIS — M62830 Muscle spasm of back: Secondary | ICD-10-CM | POA: Diagnosis not present

## 2018-12-05 DIAGNOSIS — M5413 Radiculopathy, cervicothoracic region: Secondary | ICD-10-CM | POA: Diagnosis not present

## 2018-12-05 DIAGNOSIS — M9901 Segmental and somatic dysfunction of cervical region: Secondary | ICD-10-CM | POA: Diagnosis not present

## 2018-12-05 DIAGNOSIS — M9903 Segmental and somatic dysfunction of lumbar region: Secondary | ICD-10-CM | POA: Diagnosis not present

## 2018-12-05 DIAGNOSIS — M5417 Radiculopathy, lumbosacral region: Secondary | ICD-10-CM | POA: Diagnosis not present

## 2018-12-14 ENCOUNTER — Other Ambulatory Visit: Payer: Self-pay | Admitting: Family

## 2019-01-22 DIAGNOSIS — H524 Presbyopia: Secondary | ICD-10-CM | POA: Diagnosis not present

## 2019-01-22 DIAGNOSIS — H5203 Hypermetropia, bilateral: Secondary | ICD-10-CM | POA: Diagnosis not present

## 2019-01-22 DIAGNOSIS — H52209 Unspecified astigmatism, unspecified eye: Secondary | ICD-10-CM | POA: Diagnosis not present

## 2019-02-11 DIAGNOSIS — C4441 Basal cell carcinoma of skin of scalp and neck: Secondary | ICD-10-CM | POA: Diagnosis not present

## 2019-02-11 DIAGNOSIS — L309 Dermatitis, unspecified: Secondary | ICD-10-CM | POA: Diagnosis not present

## 2019-02-11 DIAGNOSIS — L578 Other skin changes due to chronic exposure to nonionizing radiation: Secondary | ICD-10-CM | POA: Diagnosis not present

## 2019-02-11 DIAGNOSIS — Z85820 Personal history of malignant melanoma of skin: Secondary | ICD-10-CM | POA: Diagnosis not present

## 2019-02-11 DIAGNOSIS — C44722 Squamous cell carcinoma of skin of right lower limb, including hip: Secondary | ICD-10-CM | POA: Diagnosis not present

## 2019-02-11 DIAGNOSIS — L821 Other seborrheic keratosis: Secondary | ICD-10-CM | POA: Diagnosis not present

## 2019-02-11 DIAGNOSIS — L570 Actinic keratosis: Secondary | ICD-10-CM | POA: Diagnosis not present

## 2019-02-11 DIAGNOSIS — C4442 Squamous cell carcinoma of skin of scalp and neck: Secondary | ICD-10-CM | POA: Diagnosis not present

## 2019-04-15 ENCOUNTER — Encounter: Payer: Self-pay | Admitting: Family

## 2019-05-05 ENCOUNTER — Other Ambulatory Visit: Payer: Self-pay | Admitting: Family

## 2019-05-19 ENCOUNTER — Ambulatory Visit (INDEPENDENT_AMBULATORY_CARE_PROVIDER_SITE_OTHER): Payer: Medicare HMO | Admitting: Family

## 2019-05-19 ENCOUNTER — Encounter: Payer: Self-pay | Admitting: Family

## 2019-05-19 ENCOUNTER — Other Ambulatory Visit: Payer: Self-pay

## 2019-05-19 VITALS — BP 120/83 | HR 72 | Temp 97.0°F | Resp 16 | Ht 69.5 in | Wt 179.8 lb

## 2019-05-19 DIAGNOSIS — I10 Essential (primary) hypertension: Secondary | ICD-10-CM | POA: Diagnosis not present

## 2019-05-19 DIAGNOSIS — E785 Hyperlipidemia, unspecified: Secondary | ICD-10-CM | POA: Diagnosis not present

## 2019-05-19 DIAGNOSIS — L989 Disorder of the skin and subcutaneous tissue, unspecified: Secondary | ICD-10-CM

## 2019-05-19 LAB — BASIC METABOLIC PANEL
BUN: 12 mg/dL (ref 6–23)
CO2: 27 mEq/L (ref 19–32)
Calcium: 9.7 mg/dL (ref 8.4–10.5)
Chloride: 101 mEq/L (ref 96–112)
Creatinine, Ser: 0.85 mg/dL (ref 0.40–1.50)
GFR: 90 mL/min (ref >60.00)
Glucose, Bld: 105 mg/dL — ABNORMAL HIGH (ref 70–99)
Potassium: 4.1 mEq/L (ref 3.5–5.1)
Sodium: 138 mEq/L (ref 135–145)

## 2019-05-19 LAB — LIPID PANEL
Cholesterol: 240 mg/dL — ABNORMAL HIGH (ref 0–200)
HDL: 58.2 mg/dL (ref >39.00)
LDL Cholesterol: 154 mg/dL — ABNORMAL HIGH (ref 0–99)
NonHDL: 182.04
Total CHOL/HDL Ratio: 4
Triglycerides: 140 mg/dL (ref 0.0–149.0)
VLDL: 28 mg/dL (ref 0.0–40.0)

## 2019-05-19 MED ORDER — METOPROLOL SUCCINATE ER 25 MG PO TB24: 25.0000 mg | ORAL_TABLET | Freq: Every day | ORAL | 1 refills | Status: DC

## 2019-05-19 MED ORDER — LORATADINE 10 MG PO TABS: 10.0000 mg | ORAL_TABLET | Freq: Every day | ORAL | 11 refills | Status: DC | PRN

## 2019-05-19 NOTE — Progress Notes (Signed)
Subjective:    Patient ID: Jose Strong, male    DOB: [birth year], 69 y.o.   MRN: [id number]  HPI   Patient is a 69 yr old male who presents today for follow up.  HTN- maintained on amlodipine 10mg  and toprol xl 25mg .   BP Readings from Last 3 Encounters:  05/19/19 120/83  11/18/18 116/80  09/10/18 125/76   Hyperlipidemia- not on statin.  Lab Results  Component Value Date   CHOL 202 (H) 11/18/2018   HDL 50.70 11/18/2018   LDLCALC 131 (H) 11/18/2018   LDLDIRECT 94.0 04/25/2017   TRIG 99.0 11/18/2018   CHOLHDL 4 11/18/2018   Skin lesion- left hand.  Declines referral at this time. States that he saw dermatology and they wanted to remove some lesions but he declined at this time due to cost.      Review of Systems See HPI  Past Medical History:  Diagnosis Date  . Cancer [facility]) 06-29-2009   melanoma --neck  . Essential hypertension 05/20/2018     Social History   Socioeconomic History  . Marital status: Widowed    Spouse name: Not on file  . Number of children: 0  . Years of education: Not on file  . Highest education level: Not on file  Occupational History  . Not on file  Tobacco Use  . Smoking status: Former Research scientist (life sciences)  . Smokeless tobacco: Never Used  Substance and Sexual Activity  . Alcohol use: Yes    Comment: 2-4 beers per day  . Drug use: Yes    Types: Amyl nitrate  . Sexual activity: Not on file  Other Topics Concern  . 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     He does not have children   Enjoys fishing/golf, recently caring for his wife.     Social Determinants of Health   Financial Resource Strain:   . Difficulty of Paying Living Expenses: Not on file  Food Insecurity:   . Worried About Charity fundraiser in the Last Year: Not on file  . Ran Out of Food in the Last Year: Not on file  Transportation Needs:   . Lack of Transportation  (Medical): Not on file  . Lack of Transportation (Non-Medical): Not on file  Physical Activity:   . Days of Exercise per Week: Not on file  . Minutes of Exercise per Session: Not on file  Stress:   . Feeling of Stress : Not on file  Social Connections:   . Frequency of Communication with Friends and Family: Not on file  . Frequency of Social Gatherings with Friends and Family: Not on file  . Attends Religious Services: Not on file  . Active Member of Clubs or Organizations: Not on file  . Attends Archivist Meetings: Not on file  . Marital Status: Not on file  Intimate Partner Violence:   . Fear of Current or Ex-Partner: Not on file  . Emotionally Abused: Not on file  . Physically Abused: Not on file  . Sexually Abused: Not on file    Past Surgical History:  Procedure Laterality Date  . [place]  2009/06/29  . TONSILLECTOMY  1964  . TONSILLECTOMY      Family History  Problem Relation Age of Onset  . COPD Mother   . Angina Mother  No Known Allergies  Current Outpatient Medications on File Prior to Visit  Medication Sig Dispense Refill  . amLODipine (NORVASC) 10 MG tablet TAKE 1 TABLET BY MOUTH EVERY DAY 90 tablet 1  . B Complex-Folic Acid (B COMPLEX PLUS PO) Take 1 tablet by mouth daily.    . Calcium-Magnesium-Zinc 167-83-8 MG TABS Take 1 tablet by mouth daily.    . metoprolol succinate (TOPROL-XL) 25 MG 24 hr tablet TAKE 1 TABLET BY MOUTH EVERY DAY 90 tablet 1  . Multiple Vitamin (MULTIVITAMIN) tablet Take 1 tablet by mouth daily.    . Omega-3 Fatty Acids (FISH OIL) 1000 MG CPDR 3000 mg by mouth twice daily.    Marland Kitchen triamcinolone cream (KENALOG) 0.1 % APPLY TO AFFECTED AREA TWICE A DAY ONLY AS NEEDED TO ITCHY OR INFLAMED AREA(S)    . TURMERIC PO Take 2 tablets by mouth daily.     . Vitamins C E (VITAMIN C & E COMBINATION PO) Take 1 capsule by mouth daily.     No current facility-administered medications on file prior to visit.    BP 120/83 (BP Location:  Right Arm, Patient Position: Sitting, Cuff Size: Small)   Pulse 72   Temp (!) 97 F (36.1 C) (Temporal)   Resp 16   Ht 5' 9.5" (1.765 m)   Wt 179 lb 12.8 oz (81.6 kg)   SpO2 98%   BMI 26.17 kg/m       Objective:   Physical Exam Constitutional:      General: He is not in acute distress.    Appearance: He is well-developed.  HENT:     Head: Normocephalic and atraumatic.  Cardiovascular:     Rate and Rhythm: Normal rate and regular rhythm.     Heart sounds: No murmur.  Pulmonary:     Effort: Pulmonary effort is normal. No respiratory distress.     Breath sounds: Normal breath sounds. No wheezing or rales.  Skin:    General: Skin is warm and dry.     Comments: Dry raised lesion left dorsal hand  Neurological:     Mental Status: He is alert and oriented to person, place, and time.  Psychiatric:        Behavior: Behavior normal.        Thought Content: Thought content normal.           Assessment & Plan:  HTN- bp stable on current regimen. Obtain follow up bmet. Continue current meds.  Hyperlipidemia- obtain follow up lipid panel. Not on statin. LDL above goal.  Skin lesion- declines referral to dermatology at this time. He states he will let me know when he is ready. He does have some skin itching that has temporary relief from triamcinolone cream but then it comes back. Recommended trial of claritin once daily.

## 2019-05-19 NOTE — Patient Instructions (Signed)
Below are two ways to schedule a Covid-19 Vaccine:  Please visit [email or web address] to register or call [phone]  Or call:  [facility] Covid-19 vaccine scheduling at [phone]  You can also contact your local [facility] to inquire about scheduling.

## 2019-05-20 ENCOUNTER — Other Ambulatory Visit: Payer: Self-pay | Admitting: Family

## 2019-05-20 DIAGNOSIS — E785 Hyperlipidemia, unspecified: Secondary | ICD-10-CM

## 2019-05-20 NOTE — Telephone Encounter (Signed)
Cholesterol is high.  I would recommend that he add atorvastatin 20mg  once daily to help lower his cholesterol and decrease his risk of heart attack/stroke.  Repeat lipid panel in 6 weeks, dx hyperlipidemia. I have pended the rx.

## 2019-05-21 NOTE — Telephone Encounter (Signed)
Lvm  for patient to call back about results. 

## 2019-05-21 NOTE — Telephone Encounter (Signed)
Advised patient of results and provider's advise, he said "he will not take a statin". Patient was scheduled for repeat labs 06-30-19

## 2019-06-15 NOTE — Progress Notes (Addendum)
Nurse connected with patient 06/16/19 at 10:15 AM EDT by a telephone enabled telemedicine application and verified that I am speaking with the correct person using two identifiers. Patient stated full name and DOB. Patient gave permission to continue with virtual visit. Patient's location was at home and Nurse's location was at [place] office.   Subjective:   Jose Strong is a 69 y.o. male who presents for an Initial Medicare Annual Wellness Visit.  Review of Systems  Home Safety/Smoke Alarms: Feels safe in home. Smoke alarms in place.  Lives alone in 1 story home.   Male:   CCS- 01/30/17. Negative     PSA-  Lab Results  Component Value Date   PSA 3.24 11/18/2018   PSA 3.04 12/31/2016   PSA 3.31 12/27/2015      Objective:     Advanced Directives 06/16/2019 04/03/2014  Does Patient Have a Medical Advance Directive? No No  Would patient like information on creating a medical advance directive? No - Patient declined No - patient declined information    Current Medications (verified) Outpatient Encounter Medications as of 06/16/2019  Medication Sig  . amLODipine (NORVASC) 10 MG tablet TAKE 1 TABLET BY MOUTH EVERY DAY  . B Complex-Folic Acid (B COMPLEX PLUS PO) Take 1 tablet by mouth daily.  . Calcium-Magnesium-Zinc 167-83-8 MG TABS Take 1 tablet by mouth daily.  Marland Kitchen loratadine (CLARITIN) 10 MG tablet Take 1 tablet (10 mg total) by mouth daily as needed for itching.  . metoprolol succinate (TOPROL-XL) 25 MG 24 hr tablet Take 1 tablet (25 mg total) by mouth daily.  . Multiple Vitamin (MULTIVITAMIN) tablet Take 1 tablet by mouth daily.  . Omega-3 Fatty Acids (FISH OIL) 1000 MG CPDR 3000 mg by mouth twice daily.  Marland Kitchen triamcinolone cream (KENALOG) 0.1 % APPLY TO AFFECTED AREA TWICE A DAY ONLY AS NEEDED TO ITCHY OR INFLAMED AREA(S)  . TURMERIC PO Take 2 tablets by mouth daily.   . Vitamins C E (VITAMIN C & E COMBINATION PO) Take 1 capsule by mouth daily.   No facility-administered  encounter medications on file as of 06/16/2019.    Allergies (verified) Patient has no known allergies.   History: Past Medical History:  Diagnosis Date  . Cancer [facility]) 03-Jun-2009   melanoma --neck  . Essential hypertension 05/20/2018   Past Surgical History:  Procedure Laterality Date  . [place]  06-03-2009  . TONSILLECTOMY  1964  . TONSILLECTOMY     Family History  Problem Relation Age of Onset  . COPD Mother   . Angina Mother    Social History   Socioeconomic History  . Marital status: Widowed    Spouse name: Not on file  . Number of children: 0  . Years of education: Not on file  . Highest education level: Not on file  Occupational History  . Not on file  Tobacco Use  . Smoking status: Former Research scientist (life sciences)  . Smokeless tobacco: Never Used  Substance and Sexual Activity  . Alcohol use: Yes    Comment: 2-4 beers per day  . Drug use: Yes    Types: Amyl nitrate  . Sexual activity: Not on file  Other Topics Concern  . 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     He does not have children   Enjoys fishing/golf, recently caring for his wife.  Social Determinants of Health   Financial Resource Strain: Low Risk   . Difficulty of Paying Living Expenses: Not hard at all  Food Insecurity: No Food Insecurity  . Worried About Charity fundraiser in the Last Year: Never true  . Ran Out of Food in the Last Year: Never true  Transportation Needs: No Transportation Needs  . Lack of Transportation (Medical): No  . Lack of Transportation (Non-Medical): No  Physical Activity:   . Days of Exercise per Week:   . Minutes of Exercise per Session:   Stress:   . Feeling of Stress :   Social Connections:   . Frequency of Communication with Friends and Family:   . Frequency of Social Gatherings with Friends and Family:   . Attends Religious Services:   . Active Member of Clubs  or Organizations:   . Attends Archivist Meetings:   Marland Kitchen Marital Status:    Tobacco Counseling Counseling given: Not Answered   Clinical Intake:  Pain : No/denies pain   Activities of Daily Living In your present state of health, do you have any difficulty performing the following activities: 06/16/2019 05/19/2019  Hearing? N N  Vision? N N  Difficulty concentrating or making decisions? N N  Walking or climbing stairs? N N  Dressing or bathing? N N  Doing errands, shopping? N N  Preparing Food and eating ? N -  Using the Toilet? N -  In the past six months, have you accidently leaked urine? N -  Do you have problems with loss of bowel control? N -  Managing your Medications? N -  Managing your Finances? N -  Housekeeping or managing your Housekeeping? N -  Some recent data might be hidden     Immunizations and Health Maintenance Immunization History  Administered Date(s) Administered  . Fluad Quad(high Dose 65+) 11/18/2018  . Influenza, High Dose Seasonal PF 01/08/2018  . Influenza,inj,Quad PF,6+ Mos 12/08/2012, 12/08/2013, 12/27/2015, 12/31/2016  . Pneumococcal Conjugate-13 01/08/2018  . Td 02/10/2016  . Tdap 03/12/2006  . Zoster 12/08/2013   Health Maintenance Due  Topic Date Due  . COLONOSCOPY  Never done  . PNA vac Low Risk Adult (2 of 2 - PPSV23) 01/09/2019    Patient Care Team: [name], NP as PCP - General (Internal Medicine)  Indicate any recent Medical Services you may have received from other than [facility] providers in the past year (date may be approximate).    Assessment:   This is a routine wellness examination for Jose Strong. Physical assessment deferred to PCP.  Hearing/Vision screen Unable to assess. This visit is enabled though telemedicine due to Covid 19.   Dietary issues and exercise activities discussed: Current Exercise Habits: The patient does not participate in regular exercise at present, Exercise limited by: None  identified Diet (meal preparation, eat out, water intake, caffeinated beverages, dairy products, fruits and vegetables): in general, a "healthy" diet  , well balanced   Goals    . Increase physical activity      Depression Screen PHQ 2/9 Scores 06/16/2019 05/19/2019 01/08/2018 02/10/2016  PHQ - 2 Score 0 0 0 0  PHQ- 9 Score - - 0 -    Fall Risk Fall Risk  06/16/2019 05/19/2019 02/18/2018 02/10/2016  Falls in the past year? 0 0 0 No  Number falls in past yr: 0 0 - -  Injury with Fall? 0 0 0 -  Follow up Education provided;Falls prevention discussed - - -  Cognitive Function: Ad8 score reviewed for issues:  Issues making decisions:no  Less interest in hobbies / activities:no  Repeats questions, stories (family complaining):no  Trouble using ordinary gadgets (microwave, computer, phone):no  Forgets the month or year: no  Mismanaging finances: no  Remembering appts:no  Daily problems with thinking and/or memory:no Ad8 score is=0        Screening Tests Health Maintenance  Topic Date Due  . COLONOSCOPY  Never done  . PNA vac Low Risk Adult (2 of 2 - PPSV23) 01/09/2019  . INFLUENZA VACCINE  10/11/2019  . TETANUS/TDAP  02/09/2026  . Hepatitis C Screening  Completed      Plan:    Please schedule your next medicare wellness visit with me in 1 yr.  Continue to eat heart healthy diet (full of fruits, vegetables, whole grains, lean protein, water--limit salt, fat, and sugar intake) and increase physical activity as tolerated.  Continue doing brain stimulating activities (puzzles, reading, adult coloring books, staying active) to keep memory sharp.     I have personally reviewed and noted the following in the patient's chart:   . Medical and social history . Use of alcohol, tobacco or illicit drugs  . Current medications and supplements . Functional ability and status . Nutritional status . Physical activity . Advanced directives . List of other  physicians . Hospitalizations, surgeries, and ER visits in previous 12 months . Vitals . Screenings to include cognitive, depression, and falls . Referrals and appointments  In addition, I have reviewed and discussed with patient certain preventive protocols, quality metrics, and best practice recommendations. A written personalized care plan for preventive services as well as general preventive health recommendations were provided to patient.     Naaman Plummer [place], [place]   06/16/2019

## 2019-06-16 ENCOUNTER — Ambulatory Visit: Payer: Medicare HMO | Admitting: *Deleted

## 2019-06-16 ENCOUNTER — Other Ambulatory Visit: Payer: Self-pay

## 2019-06-16 ENCOUNTER — Encounter: Payer: Self-pay | Admitting: *Deleted

## 2019-06-16 DIAGNOSIS — Z0000 Encounter for general adult medical examination without abnormal findings: Secondary | ICD-10-CM

## 2019-06-16 NOTE — Patient Instructions (Signed)
Please schedule your next medicare wellness visit with me in 1 yr.  Continue to eat heart healthy diet (full of fruits, vegetables, whole grains, lean protein, water--limit salt, fat, and sugar intake) and increase physical activity as tolerated.  Continue doing brain stimulating activities (puzzles, reading, adult coloring books, staying active) to keep memory sharp.    Jose Strong , Thank you for taking time to come for your Medicare Wellness Visit. I appreciate your ongoing commitment to your health goals. Please review the following plan we discussed and let me know if I can assist you in the future.   These are the goals we discussed: Goals    . Increase physical activity       This is a list of the screening recommended for you and due dates:  Health Maintenance  Topic Date Due  . Colon Cancer Screening  Never done  . Pneumonia vaccines (2 of 2 - PPSV23) 01/09/2019  . Flu Shot  10/11/2019  . Tetanus Vaccine  02/09/2026  .  Hepatitis C: One time screening is recommended by Center for Disease Control  (CDC) for  adults born from [birth year] through 1963.   Completed    Preventive Care 67 Years and Older, Male Preventive care refers to lifestyle choices and visits with your health care provider that can promote health and wellness. This includes:  A yearly physical exam. This is also called an annual well check.  Regular dental and eye exams.  Immunizations.  Screening for certain conditions.  Healthy lifestyle choices, such as diet and exercise. What can I expect for my preventive care visit? Physical exam Your health care provider will check:  Height and weight. These may be used to calculate body mass index (BMI), which is a measurement that tells if you are at a healthy weight.  Heart rate and blood pressure.  Your skin for abnormal spots. Counseling Your health care provider may ask you questions about:  Alcohol, tobacco, and drug use.  Emotional  well-being.  Home and relationship well-being.  Sexual activity.  Eating habits.  History of falls.  Memory and ability to understand (cognition).  Work and work Statistician. What immunizations do I need?  Influenza (flu) vaccine  This is recommended every year. Tetanus, diphtheria, and pertussis (Tdap) vaccine  You may need a Td booster every 10 years. Varicella (chickenpox) vaccine  You may need this vaccine if you have not already been vaccinated. Zoster (shingles) vaccine  You may need this after age 5. Pneumococcal conjugate (PCV13) vaccine  One dose is recommended after age 67. Pneumococcal polysaccharide (PPSV23) vaccine  One dose is recommended after age 74. Measles, mumps, and rubella (MMR) vaccine  You may need at least one dose of MMR if you were born in 1957 or later. You may also need a second dose. Meningococcal conjugate (MenACWY) vaccine  You may need this if you have certain conditions. Hepatitis A vaccine  You may need this if you have certain conditions or if you travel or work in places where you may be exposed to hepatitis A. Hepatitis B vaccine  You may need this if you have certain conditions or if you travel or work in places where you may be exposed to hepatitis B. Haemophilus influenzae type b (Hib) vaccine  You may need this if you have certain conditions. You may receive vaccines as individual doses or as more than one vaccine together in one shot (combination vaccines). Talk with your health care provider about the risks  and benefits of combination vaccines. What tests do I need? Blood tests  Lipid and cholesterol levels. These may be checked every 5 years, or more frequently depending on your overall health.  Hepatitis C test.  Hepatitis B test. Screening  Lung cancer screening. You may have this screening every year starting at age 67 if you have a 30-pack-year history of smoking and currently smoke or have quit within the  past 15 years.  Colorectal cancer screening. All adults should have this screening starting at age 67 and continuing until age 104. Your health care provider may recommend screening at age 51 if you are at increased risk. You will have tests every 67 years, depending on your results and the type of screening test.  Prostate cancer screening. Recommendations will vary depending on your family history and other risks.  Diabetes screening. This is done by checking your blood sugar (glucose) after you have not eaten for a while (fasting). You may have this done every 1-3 years.  Abdominal aortic aneurysm (AAA) screening. You may need this if you are a current or former smoker.  Sexually transmitted disease (STD) testing. Follow these instructions at home: Eating and drinking  Eat a diet that includes fresh fruits and vegetables, whole grains, lean protein, and low-fat dairy products. Limit your intake of foods with high amounts of sugar, saturated fats, and salt.  Take vitamin and mineral supplements as recommended by your health care provider.  Do not drink alcohol if your health care provider tells you not to drink.  If you drink alcohol: ? Limit how much you have to 0-2 drinks a day. ? Be aware of how much alcohol is in your drink. In the [place] one drink equals one 12 oz bottle of beer (355 mL), one 5 oz glass of wine (148 mL), or one 1 oz glass of hard liquor (44 mL). Lifestyle  Take daily care of your teeth and gums.  Stay active. Exercise for at least 30 minutes on 5 or more days each week.  Do not use any products that contain nicotine or tobacco, such as cigarettes, e-cigarettes, and chewing tobacco. If you need help quitting, ask your health care provider.  If you are sexually active, practice safe sex. Use a condom or other form of protection to prevent STIs (sexually transmitted infections).  Talk with your health care provider about taking a low-dose aspirin or  statin. What's next?  Visit your health care provider once a year for a well check visit.  Ask your health care provider how often you should have your eyes and teeth checked.  Stay up to date on all vaccines. This information is not intended to replace advice given to you by your health care provider. Make sure you discuss any questions you have with your health care provider. Document Revised: 02/20/2018 Document Reviewed: 02/20/2018 Elsevier Patient Education  2020 [employer].

## 2019-06-16 NOTE — Progress Notes (Signed)
Reviewed  Marquee Fuchs R Lowne Chase, DO  

## 2019-06-30 ENCOUNTER — Other Ambulatory Visit (INDEPENDENT_AMBULATORY_CARE_PROVIDER_SITE_OTHER): Payer: Medicare HMO

## 2019-06-30 ENCOUNTER — Encounter: Payer: Self-pay | Admitting: Family

## 2019-06-30 ENCOUNTER — Other Ambulatory Visit: Payer: Self-pay

## 2019-06-30 DIAGNOSIS — E785 Hyperlipidemia, unspecified: Secondary | ICD-10-CM

## 2019-06-30 LAB — LIPID PANEL
Cholesterol: 161 mg/dL (ref 0–200)
HDL: 57.2 mg/dL (ref >39.00)
LDL Cholesterol: 83 mg/dL (ref 0–99)
NonHDL: 103.6
Total CHOL/HDL Ratio: 3
Triglycerides: 102 mg/dL (ref 0.0–149.0)
VLDL: 20.4 mg/dL (ref 0.0–40.0)

## 2019-07-10 IMAGING — US VENOUS DOPPLER ULTRASOUND OF LEFT LOWER EXTREMITY
1 series · 13 of 24 positions shown · non-contrast
Comparison: None.

CLINICAL DATA: 67-year-old male with left lower extremity pain and
swelling



[Series 1: venous doppler ultrasound of left lower extremity · 13 of 34 slices shown]
[im 1/34]
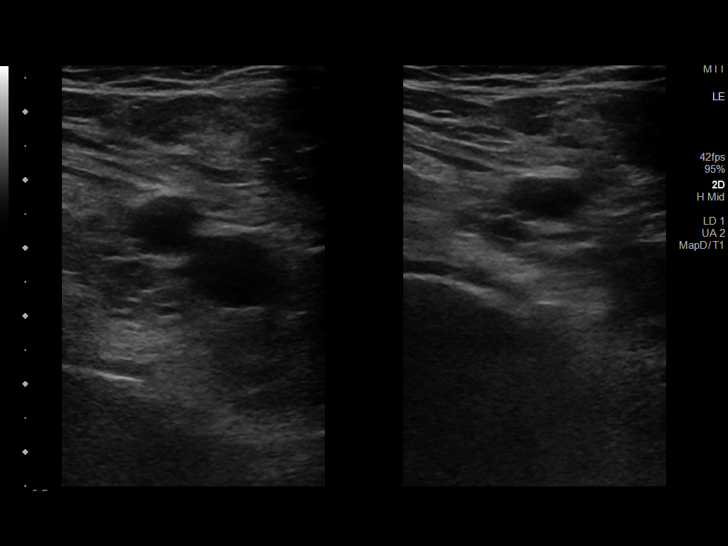
[im 3/34]
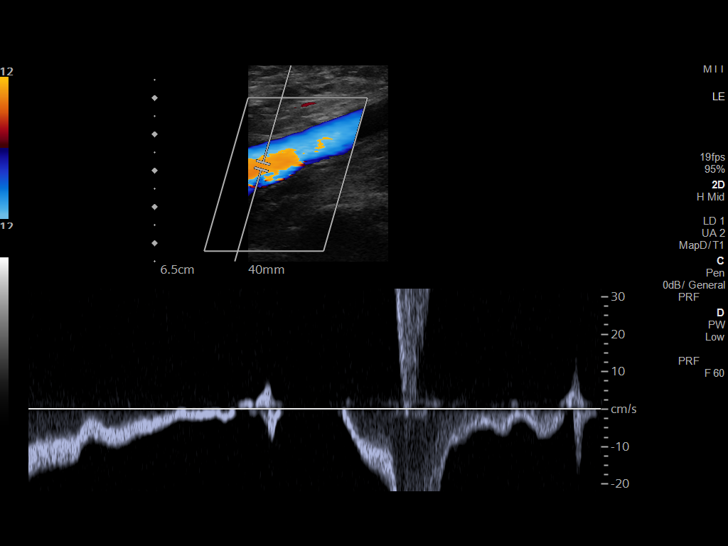
[im 6/34]
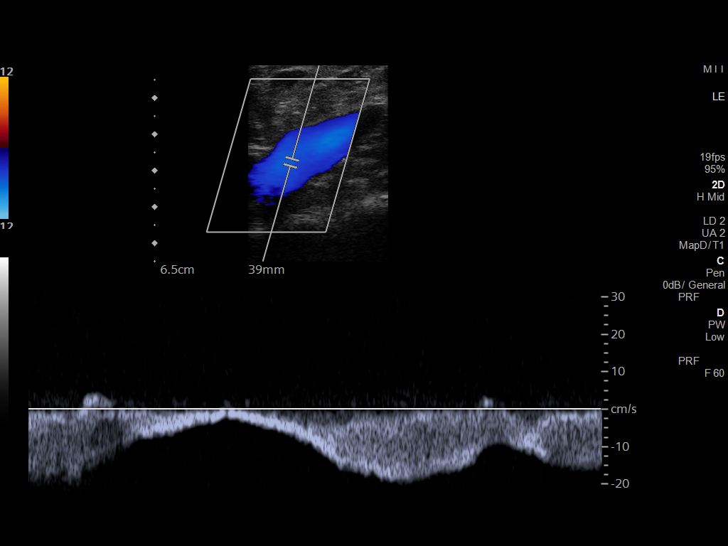
[im 9/34]
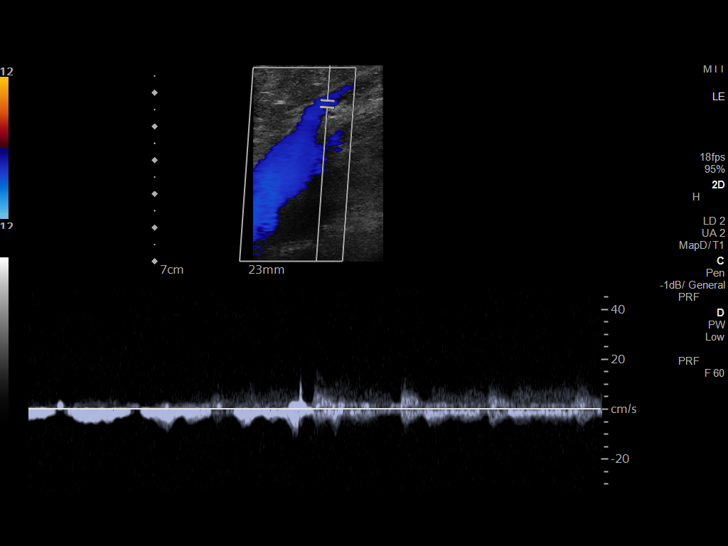
[im 12/34]
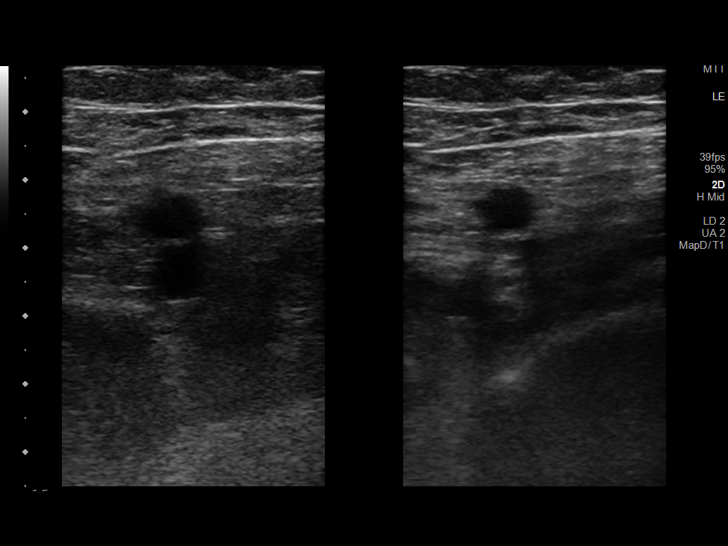
[im 15/34]
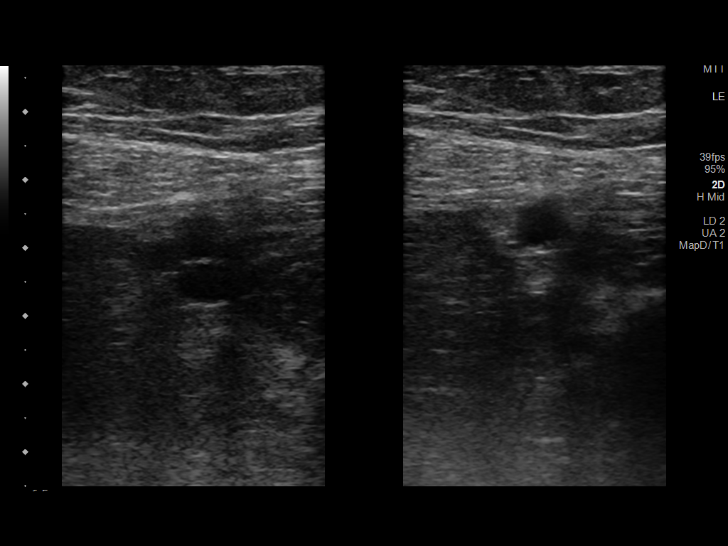
[im 18/34]
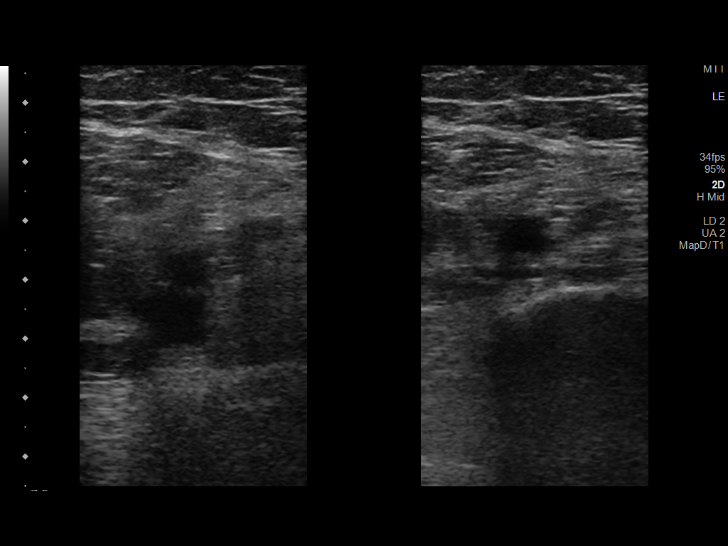
[im 19/34]
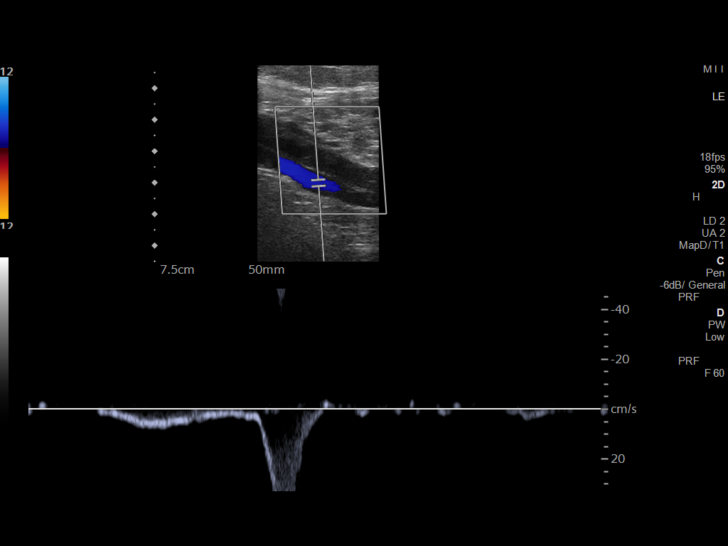
[im 22/34]
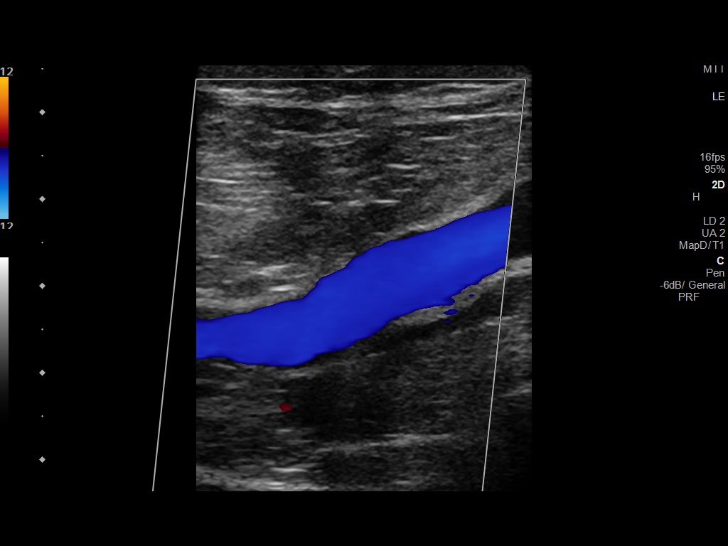
[im 25/34]
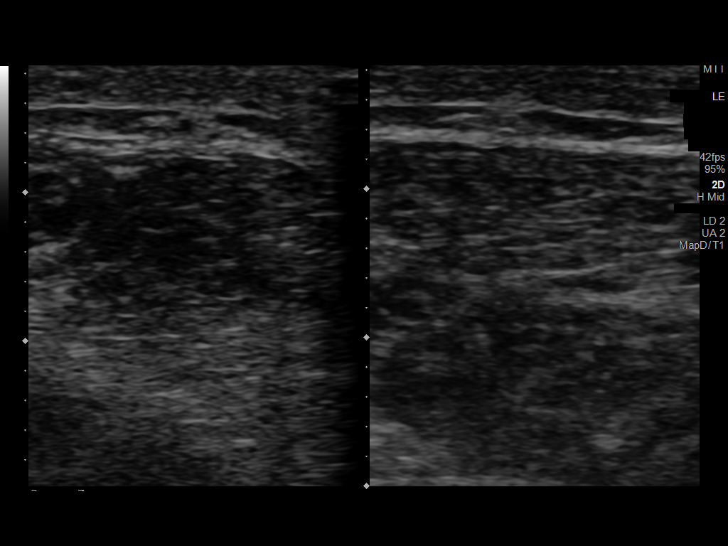
[im 28/34]
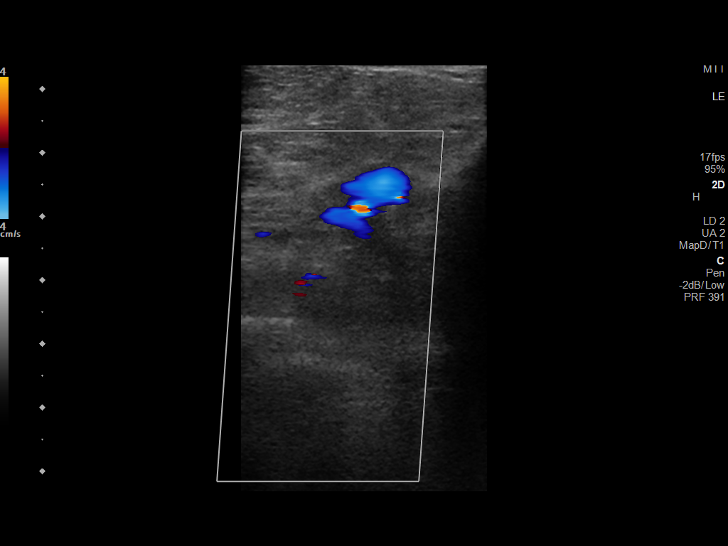
[im 31/34]
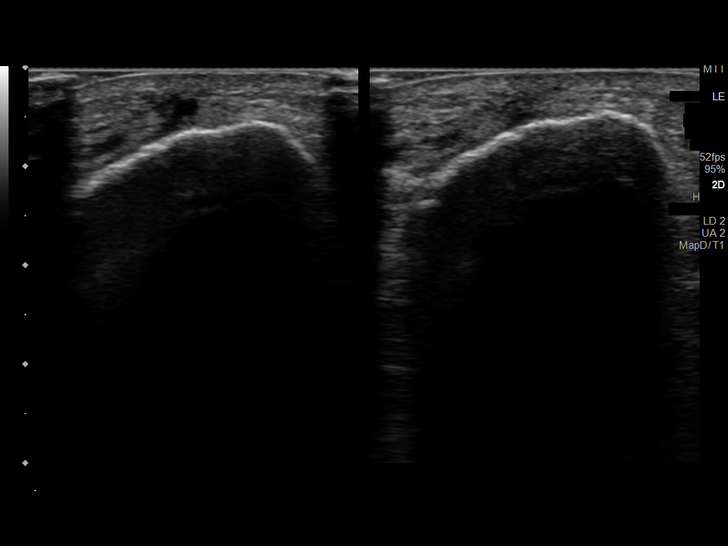
[im 34/34]
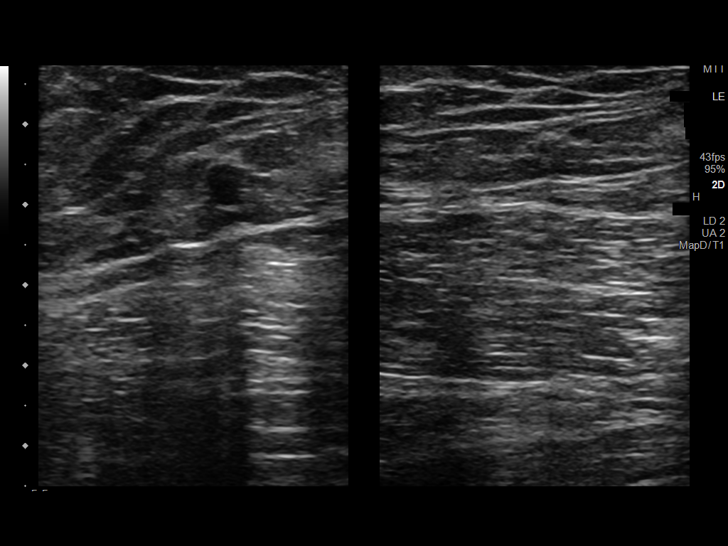

[13 of 24 positions shown; findings below may reference images not displayed]

FINDINGS: Contralateral Common Femoral Vein: Respiratory phasicity is normal
and symmetric with the symptomatic side. No evidence of thrombus.
Normal compressibility.

Common Femoral Vein: No evidence of thrombus. Normal
compressibility, respiratory phasicity and response to augmentation.

Saphenofemoral Junction: No evidence of thrombus. Normal
compressibility and flow on color Doppler imaging.

Profunda Femoral Vein: No evidence of thrombus. Normal
compressibility and flow on color Doppler imaging.

Femoral Vein: No evidence of thrombus. Normal compressibility,
respiratory phasicity and response to augmentation.

Popliteal Vein: No evidence of thrombus. Normal compressibility,
respiratory phasicity and response to augmentation.

Calf Veins: No evidence of thrombus. Normal compressibility and flow
on color Doppler imaging.

Superficial Great Saphenous Vein: No evidence of thrombus. Normal
compressibility.

Venous Reflux:  None.

Other Findings:  None.
IMPRESSION: No evidence of deep venous thrombosis.

## 2019-09-07 DIAGNOSIS — H43391 Other vitreous opacities, right eye: Secondary | ICD-10-CM | POA: Diagnosis not present

## 2019-09-07 DIAGNOSIS — H5319 Other subjective visual disturbances: Secondary | ICD-10-CM | POA: Diagnosis not present

## 2019-10-09 DIAGNOSIS — M62830 Muscle spasm of back: Secondary | ICD-10-CM | POA: Diagnosis not present

## 2019-10-09 DIAGNOSIS — M9903 Segmental and somatic dysfunction of lumbar region: Secondary | ICD-10-CM | POA: Diagnosis not present

## 2019-10-09 DIAGNOSIS — M9901 Segmental and somatic dysfunction of cervical region: Secondary | ICD-10-CM | POA: Diagnosis not present

## 2019-10-09 DIAGNOSIS — M5417 Radiculopathy, lumbosacral region: Secondary | ICD-10-CM | POA: Diagnosis not present

## 2019-10-09 DIAGNOSIS — M5413 Radiculopathy, cervicothoracic region: Secondary | ICD-10-CM | POA: Diagnosis not present

## 2019-11-07 ENCOUNTER — Other Ambulatory Visit: Payer: Self-pay | Admitting: Family

## 2019-11-24 ENCOUNTER — Encounter: Payer: Self-pay | Admitting: Family

## 2019-11-24 ENCOUNTER — Other Ambulatory Visit: Payer: Self-pay

## 2019-11-24 ENCOUNTER — Ambulatory Visit (INDEPENDENT_AMBULATORY_CARE_PROVIDER_SITE_OTHER): Payer: Medicare HMO | Admitting: Family

## 2019-11-24 VITALS — BP 135/84 | HR 81 | Temp 98.5°F | Resp 16 | Ht 69.5 in | Wt 171.4 lb

## 2019-11-24 DIAGNOSIS — I10 Essential (primary) hypertension: Secondary | ICD-10-CM

## 2019-11-24 DIAGNOSIS — E785 Hyperlipidemia, unspecified: Secondary | ICD-10-CM | POA: Diagnosis not present

## 2019-11-24 DIAGNOSIS — L309 Dermatitis, unspecified: Secondary | ICD-10-CM

## 2019-11-24 DIAGNOSIS — Z23 Encounter for immunization: Secondary | ICD-10-CM

## 2019-11-24 MED ORDER — CLOTRIMAZOLE-BETAMETHASONE 1-0.05 % EX CREA: 1.0000 "application " | TOPICAL_CREAM | Freq: Two times a day (BID) | CUTANEOUS | 1 refills | Status: DC

## 2019-11-24 NOTE — Progress Notes (Addendum)
Subjective:    Patient ID: Jose Strong, male    DOB: November 26, 1950, 69 y.o.   MRN: [id number]  HPI  Patient is a 69 yr old male who presents today for follow up.  HTN- maintained on amlodipine/metoprolol. He reports occasional swelling in his feet.  BP Readings from Last 3 Encounters:  11/24/19 135/84  05/19/19 120/83  11/18/18 116/80   Hyperlipidemia- not on statin.  Lab Results  Component Value Date   CHOL 161 06/30/2019   HDL 57.20 06/30/2019   LDLCALC 83 06/30/2019   LDLDIRECT 94.0 04/25/2017   TRIG 102.0 06/30/2019   CHOLHDL 3 06/30/2019   Dermatitis- both hands. Notes that he has been using multiple otc ointments without improvement  Review of Systems See HPI  Past Medical History:  Diagnosis Date  . Cancer [facility]) Jun 22, 2009   melanoma --neck  . Essential hypertension 05/20/2018     Social History   Socioeconomic History  . Marital status: Widowed    Spouse name: Not on file  . Number of children: 0  . Years of education: Not on file  . Highest education level: Not on file  Occupational History  . Not on file  Tobacco Use  . Smoking status: Former Research scientist (life sciences)  . Smokeless tobacco: Never Used  Substance and Sexual Activity  . Alcohol use: Yes    Comment: 2-4 beers per day  . Drug use: Yes    Types: Amyl nitrate  . Sexual activity: Not on file  Other Topics Concern  . 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     He does not have children   Enjoys fishing/golf, recently caring for his wife.     Social Determinants of Health   Financial Resource Strain: Low Risk   . Difficulty of Paying Living Expenses: Not hard at all  Food Insecurity: No Food Insecurity  . Worried About Charity fundraiser in the Last Year: Never true  . Ran Out of Food in the Last Year: Never true  Transportation Needs: No Transportation Needs  . Lack of Transportation (Medical):  No  . Lack of Transportation (Non-Medical): No  Physical Activity:   . Days of Exercise per Week: Not on file  . Minutes of Exercise per Session: Not on file  Stress:   . Feeling of Stress : Not on file  Social Connections:   . Frequency of Communication with Friends and Family: Not on file  . Frequency of Social Gatherings with Friends and Family: Not on file  . Attends Religious Services: Not on file  . Active Member of Clubs or Organizations: Not on file  . Attends Archivist Meetings: Not on file  . Marital Status: Not on file  Intimate Partner Violence:   . Fear of Current or Ex-Partner: Not on file  . Emotionally Abused: Not on file  . Physically Abused: Not on file  . Sexually Abused: Not on file    Past Surgical History:  Procedure Laterality Date  . [place]  June 22, 2009  . TONSILLECTOMY  1964  . TONSILLECTOMY      Family History  Problem Relation Age of Onset  . COPD Mother   . Angina Mother     No Known Allergies  Current Outpatient Medications on File Prior to Visit  Medication Sig Dispense Refill  . amLODipine (NORVASC)  10 MG tablet TAKE 1 TABLET BY MOUTH EVERY DAY 90 tablet 1  . B Complex-Folic Acid (B COMPLEX PLUS PO) Take 1 tablet by mouth daily.    . Calcium-Magnesium-Zinc 167-83-8 MG TABS Take 1 tablet by mouth daily.    . metoprolol succinate (TOPROL-XL) 25 MG 24 hr tablet TAKE 1 TABLET BY MOUTH EVERY DAY 90 tablet 1  . Multiple Vitamin (MULTIVITAMIN) tablet Take 1 tablet by mouth daily.    . Omega-3 Fatty Acids (FISH OIL) 1000 MG CPDR 3000 mg by mouth twice daily.    . TURMERIC PO Take 2 tablets by mouth daily.     . Vitamins C E (VITAMIN C & E COMBINATION PO) Take 1 capsule by mouth daily.     No current facility-administered medications on file prior to visit.    BP 135/84 (BP Location: Right Arm, Patient Position: Sitting, Cuff Size: Small)   Pulse 81   Temp 98.5 F (36.9 C) (Oral)   Resp 16   Ht 5' 9.5" (1.765 m)   Wt 171 lb 6.4  oz (77.7 kg)   SpO2 98%   BMI 24.95 kg/m       Objective:   Physical Exam Constitutional:      General: He is not in acute distress.    Appearance: He is well-developed.  HENT:     Head: Normocephalic and atraumatic.  Cardiovascular:     Rate and Rhythm: Normal rate and regular rhythm.     Heart sounds: No murmur heard.      Comments: Trace bilateral LE edema    Pulmonary:     Effort: Pulmonary effort is normal. No respiratory distress.     Breath sounds: Normal breath sounds. No wheezing or rales.  Skin:    General: Skin is warm and dry.     Comments: Bilateral hands with dry cracking skin on both palms and fingertips  Neurological:     Mental Status: He is alert and oriented to person, place, and time.  Psychiatric:        Behavior: Behavior normal.        Thought Content: Thought content normal.           Assessment & Plan:  HTN- bp stable. Continue amlodipine/metoprolol.  Hyperlipidemia- not on statin.  Lipids are stable.  Dermatitis- new. will give rx with lotrisone.   Flu shot today.  This visit occurred during the SARS-CoV-2 public health emergency.  Safety protocols were in place, including screening questions prior to the visit, additional usage of staff PPE, and extensive cleaning of exam room while observing appropriate contact time as indicated for disinfecting solutions.   Addendum.  Note, that use of Amyl Nitrate under substance use was an error.

## 2019-12-19 ENCOUNTER — Encounter: Payer: Self-pay | Admitting: Family

## 2019-12-19 DIAGNOSIS — R21 Rash and other nonspecific skin eruption: Secondary | ICD-10-CM

## 2019-12-21 ENCOUNTER — Encounter: Payer: Self-pay | Admitting: Family

## 2019-12-29 DIAGNOSIS — L2089 Other atopic dermatitis: Secondary | ICD-10-CM | POA: Diagnosis not present

## 2019-12-29 DIAGNOSIS — Z79899 Other long term (current) drug therapy: Secondary | ICD-10-CM | POA: Diagnosis not present

## 2019-12-29 DIAGNOSIS — L309 Dermatitis, unspecified: Secondary | ICD-10-CM | POA: Diagnosis not present

## 2020-01-01 ENCOUNTER — Encounter: Payer: Self-pay | Admitting: Family

## 2020-01-05 ENCOUNTER — Encounter: Payer: Self-pay | Admitting: Family

## 2020-01-15 LAB — FECAL OCCULT BLOOD, IMMUNOCHEMICAL: IFOBT: NEGATIVE

## 2020-02-09 ENCOUNTER — Telehealth: Payer: Self-pay | Admitting: Family

## 2020-02-09 ENCOUNTER — Encounter: Payer: Self-pay | Admitting: Family

## 2020-02-09 ENCOUNTER — Other Ambulatory Visit (HOSPITAL_BASED_OUTPATIENT_CLINIC_OR_DEPARTMENT_OTHER): Payer: Self-pay | Admitting: Internal Medicine

## 2020-02-09 ENCOUNTER — Ambulatory Visit: Payer: Medicare HMO | Attending: Internal Medicine

## 2020-02-09 DIAGNOSIS — Z23 Encounter for immunization: Secondary | ICD-10-CM

## 2020-02-09 MED FILL — PFIZER-BIONTECH COVID-19 VA: 30 | 1 days supply | Qty: 0 | Fill #0

## 2020-02-09 NOTE — Telephone Encounter (Signed)
Form put on provider's folder.  

## 2020-02-09 NOTE — Progress Notes (Signed)
   Covid-19 Vaccination Clinic  Name:  Jose Strong    MRN: [id number] DOB: Jun 11, 1950  02/09/2020  Mr. Lorenson was observed post Covid-19 immunization for 15 minutes without incident. He was provided with Vaccine Information Sheet and instruction to access the V-Safe system.   Mr. Serena was instructed to call 911 with any severe reactions post vaccine: Marland Kitchen Difficulty breathing  . Swelling of face and throat  . A fast heartbeat  . A bad rash all over body  . Dizziness and weakness   Immunizations Administered    Name Date Dose VIS Date Route   Pfizer COVID-19 Vaccine 02/09/2020 10:02 AM 0.3 mL 12/30/2019 Intramuscular   Manufacturer: [place]   Lot: [id number]   [place]: [id number]

## 2020-02-09 NOTE — Telephone Encounter (Signed)
Can you see if you can addend the note from 11/24/19- under the Social history part in the drug use section please delete the amyl nitrate out?  It looks like it was taken out when we possibly triaged him that day but you may have brought your note over before it was taken out.   Please let me know if you have any questions.

## 2020-02-09 NOTE — Telephone Encounter (Signed)
Pt dropped off document for provider to see and have on pt's chart (2 pages - Home Access results of Laboratory Testing) Document put at front office tray under providers name.

## 2020-02-12 DIAGNOSIS — M9903 Segmental and somatic dysfunction of lumbar region: Secondary | ICD-10-CM | POA: Diagnosis not present

## 2020-02-12 DIAGNOSIS — M62830 Muscle spasm of back: Secondary | ICD-10-CM | POA: Diagnosis not present

## 2020-02-12 DIAGNOSIS — M5413 Radiculopathy, cervicothoracic region: Secondary | ICD-10-CM | POA: Diagnosis not present

## 2020-02-12 DIAGNOSIS — M9901 Segmental and somatic dysfunction of cervical region: Secondary | ICD-10-CM | POA: Diagnosis not present

## 2020-02-12 DIAGNOSIS — M5417 Radiculopathy, lumbosacral region: Secondary | ICD-10-CM | POA: Diagnosis not present

## 2020-02-18 DIAGNOSIS — L2089 Other atopic dermatitis: Secondary | ICD-10-CM | POA: Diagnosis not present

## 2020-02-19 ENCOUNTER — Encounter: Payer: Self-pay | Admitting: Family

## 2020-03-12 DIAGNOSIS — C439 Malignant melanoma of skin, unspecified: Secondary | ICD-10-CM

## 2020-03-12 HISTORY — DX: Malignant melanoma of skin, unspecified: C43.9

## 2020-05-19 DIAGNOSIS — L309 Dermatitis, unspecified: Secondary | ICD-10-CM | POA: Diagnosis not present

## 2020-05-19 DIAGNOSIS — C434 Malignant melanoma of scalp and neck: Secondary | ICD-10-CM | POA: Diagnosis not present

## 2020-05-19 DIAGNOSIS — D485 Neoplasm of uncertain behavior of skin: Secondary | ICD-10-CM | POA: Diagnosis not present

## 2020-05-19 DIAGNOSIS — L308 Other specified dermatitis: Secondary | ICD-10-CM | POA: Diagnosis not present

## 2020-05-19 DIAGNOSIS — L2089 Other atopic dermatitis: Secondary | ICD-10-CM | POA: Diagnosis not present

## 2020-05-19 DIAGNOSIS — C439 Malignant melanoma of skin, unspecified: Secondary | ICD-10-CM

## 2020-05-19 HISTORY — DX: Malignant melanoma of skin, unspecified: C43.9

## 2020-05-24 ENCOUNTER — Ambulatory Visit (INDEPENDENT_AMBULATORY_CARE_PROVIDER_SITE_OTHER): Payer: Medicare HMO | Admitting: Family

## 2020-05-24 ENCOUNTER — Other Ambulatory Visit: Payer: Self-pay

## 2020-05-24 VITALS — BP 140/86 | HR 68 | Temp 98.4°F | Resp 16 | Ht 70.0 in | Wt 170.0 lb

## 2020-05-24 DIAGNOSIS — I10 Essential (primary) hypertension: Secondary | ICD-10-CM

## 2020-05-24 DIAGNOSIS — E785 Hyperlipidemia, unspecified: Secondary | ICD-10-CM | POA: Diagnosis not present

## 2020-05-24 DIAGNOSIS — L309 Dermatitis, unspecified: Secondary | ICD-10-CM | POA: Diagnosis not present

## 2020-05-24 LAB — LIPID PANEL
Cholesterol: 221 mg/dL — ABNORMAL HIGH (ref 0–200)
HDL: 61.9 mg/dL (ref >39.00)
LDL Cholesterol: 146 mg/dL — ABNORMAL HIGH (ref 0–99)
NonHDL: 159.28
Total CHOL/HDL Ratio: 4
Triglycerides: 68 mg/dL (ref 0.0–149.0)
VLDL: 13.6 mg/dL (ref 0.0–40.0)

## 2020-05-24 LAB — COMPREHENSIVE METABOLIC PANEL
ALT: 23 U/L (ref 0–53)
AST: 17 U/L (ref 0–37)
Albumin: 4.1 g/dL (ref 3.5–5.2)
Alkaline Phosphatase: 53 U/L (ref 39–117)
BUN: 11 mg/dL (ref 6–23)
CO2: 29 mEq/L (ref 19–32)
Calcium: 9.5 mg/dL (ref 8.4–10.5)
Chloride: 103 mEq/L (ref 96–112)
Creatinine, Ser: 0.73 mg/dL (ref 0.40–1.50)
GFR: 94.04 mL/min (ref >60.00)
Glucose, Bld: 95 mg/dL (ref 70–99)
Potassium: 4.7 mEq/L (ref 3.5–5.1)
Sodium: 138 mEq/L (ref 135–145)
Total Bilirubin: 0.4 mg/dL (ref 0.2–1.2)
Total Protein: 6.9 g/dL (ref 6.0–8.3)

## 2020-05-24 NOTE — Progress Notes (Signed)
Subjective:    Patient ID: Jose Strong, male    DOB: 01/23/1951, 71 y.o.   MRN: [id number]  HPI  Patient is a 70 yr old male who presents today for follow up.   HTN- no longer taking amlodipine 10mg . He is maintained on toprol xl 25mg .   BP Readings from Last 3 Encounters:  05/24/20 140/86  11/24/19 135/84  05/19/19 120/83   Hyperlipidemia- he continues fish oil.  He is fasting.   Lab Results  Component Value Date   CHOL 161 06/30/2019   HDL 57.20 06/30/2019   LDLCALC 83 06/30/2019   LDLDIRECT 94.0 04/25/2017   TRIG 102.0 06/30/2019   CHOLHDL 3 06/30/2019   Dermatitis- he is now on dupixant per dermatology.  Hx of melanoma (neck 2009-06-02).  Reports a new skin cancer on his right neck and will have a wider excision at [facility].  He has a new girlfriend and he is really excited about his new relationship.   Review of Systems See HPI  Past Medical History:  Diagnosis Date  . Cancer [facility]) 06/02/09   melanoma --neck  . Essential hypertension 05/20/2018     Social History   Socioeconomic History  . Marital status: Widowed    Spouse name: Not on file  . Number of children: 0  . Years of education: Not on file  . Highest education level: Not on file  Occupational History  . Not on file  Tobacco Use  . Smoking status: Former Research scientist (life sciences)  . Smokeless tobacco: Never Used  Substance and Sexual Activity  . Alcohol use: Yes    Comment: 2-4 beers per day  . Drug use: Not on file  . Sexual activity: Not on file  Other Topics Concern  . 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     He does not have children   Enjoys fishing/golf, recently caring for his wife.     Social Determinants of Health   Financial Resource Strain: Low Risk   . Difficulty of Paying Living Expenses: Not hard at all  Food Insecurity: No Food Insecurity  . Worried About Sales executive in the Last Year: Never true  . Ran Out of Food in the Last Year: Never true  Transportation Needs: No Transportation Needs  . Lack of Transportation (Medical): No  . Lack of Transportation (Non-Medical): No  Physical Activity: Not on file  Stress: Not on file  Social Connections: Not on file  Intimate Partner Violence: Not on file    Past Surgical History:  Procedure Laterality Date  . [place]  06-02-2009  . TONSILLECTOMY  1964  . TONSILLECTOMY      Family History  Problem Relation Age of Onset  . COPD Mother   . Angina Mother     No Known Allergies  Current Outpatient Medications on File Prior to Visit  Medication Sig Dispense Refill  . Calcium-Magnesium-Zinc 167-83-8 MG TABS Take 1 tablet by mouth daily.    . clotrimazole-betamethasone (LOTRISONE) cream Apply 1 application topically 2 (two) times daily. 45 g 1  . dupilumab (DUPIXENT) 200 MG/1.14ML prefilled syringe Inject 200 mg into the skin once.    . metoprolol succinate (TOPROL-XL) 25 MG 24 hr tablet TAKE 1 TABLET BY MOUTH EVERY DAY 90 tablet 1  . Multiple Vitamin (MULTIVITAMIN) tablet Take 1 tablet  by mouth daily.    . Omega-3 Fatty Acids (FISH OIL) 1000 MG CPDR 3000 mg by mouth twice daily.    . TURMERIC PO Take 2 tablets by mouth daily.     . Vitamins C E (VITAMIN C & E COMBINATION PO) Take 1 capsule by mouth daily.     No current facility-administered medications on file prior to visit.    BP 140/86 (BP Location: Right Arm, Patient Position: Sitting, Cuff Size: Small)   Pulse 68   Temp 98.4 F (36.9 C) (Oral)   Resp 16   Ht 5\' 10"  (1.778 m)   Wt 170 lb (77.1 kg)   SpO2 100%   BMI 24.39 kg/m       Objective:   Physical Exam Constitutional:      General: He is not in acute distress.    Appearance: He is well-developed.  HENT:     Head: Normocephalic and atraumatic.  Cardiovascular:     Rate and Rhythm: Normal rate and regular rhythm.     Heart sounds: No murmur heard.   Pulmonary:      Effort: Pulmonary effort is normal. No respiratory distress.     Breath sounds: Normal breath sounds. No wheezing or rales.  Skin:    General: Skin is warm and dry.     Comments: Dry cracked skin on bilateral thumb/index fingers at tips  Neurological:     Mental Status: He is alert and oriented to person, place, and time.  Psychiatric:        Behavior: Behavior normal.        Thought Content: Thought content normal.           Assessment & Plan:  HTN- bp is acceptable off of amlodipine. Continue toprol xl 25mg . Obtain follow up cmet.  Hyperlipidemia- obtain follow up lipid panel.   Dermatitis- improving, management per dermatology.   32 minutes spent on today's visit.  Time was spent reviewing chart, interviewing/counseling pt.  This visit occurred during the SARS-CoV-2 public health emergency.  Safety protocols were in place, including screening questions prior to the visit, additional usage of staff PPE, and extensive cleaning of exam room while observing appropriate contact time as indicated for disinfecting solutions.

## 2020-05-25 ENCOUNTER — Encounter: Payer: Self-pay | Admitting: Family

## 2020-05-26 ENCOUNTER — Encounter: Payer: Self-pay | Admitting: Family

## 2020-06-10 DIAGNOSIS — M5417 Radiculopathy, lumbosacral region: Secondary | ICD-10-CM | POA: Diagnosis not present

## 2020-06-10 DIAGNOSIS — M62830 Muscle spasm of back: Secondary | ICD-10-CM | POA: Diagnosis not present

## 2020-06-10 DIAGNOSIS — M9903 Segmental and somatic dysfunction of lumbar region: Secondary | ICD-10-CM | POA: Diagnosis not present

## 2020-06-10 DIAGNOSIS — M9901 Segmental and somatic dysfunction of cervical region: Secondary | ICD-10-CM | POA: Diagnosis not present

## 2020-06-10 DIAGNOSIS — M5413 Radiculopathy, cervicothoracic region: Secondary | ICD-10-CM | POA: Diagnosis not present

## 2020-07-10 HISTORY — PX: MOHS SURGERY: SUR867

## 2020-07-20 NOTE — Progress Notes (Addendum)
Subjective:   Jose Strong is a 70 y.o. male who presents for an Initial Medicare Annual Wellness Visit.  I connected with Jose Strong today by telephone and verified that I am speaking with the correct person using two identifiers. Location patient: home Location provider: work Persons participating in the virtual visit: patient, Engineer, civil (consulting).    I discussed the limitations, risks, security and privacy concerns of performing an evaluation and management service by telephone and the availability of in person appointments. I also discussed with the patient that there may be a patient responsible charge related to this service. The patient expressed understanding and verbally consented to this telephonic visit.    Interactive audio and video telecommunications were attempted between this provider and patient, however failed, due to patient having technical difficulties OR patient did not have access to video capability.  We continued and completed visit with audio only.  Some vital signs may be absent or patient reported.   Time Spent with patient on telephone encounter: 20 minutes   Review of Systems     Cardiac Risk Factors include: advanced age (>74men, >80 women);dyslipidemia;hypertension;male gender     Objective:    Today's Vitals   07/21/20 0942  Weight: 170 lb (77.1 kg)  Height: 5\' 10"  (1.778 m)   Body mass index is 24.39 kg/m.  Advanced Directives 07/21/2020 06/16/2019 04/03/2014  Does Patient Have a Medical Advance Directive? Yes No No  Type of Estate agent of [place];Living will - -  Would patient like information on creating a medical advance directive? - No - Patient declined No - patient declined information    Current Medications (verified) Outpatient Encounter Medications as of 07/21/2020  Medication Sig  . Calcium-Magnesium-Zinc 167-83-8 MG TABS Take 1 tablet by mouth daily.  . clotrimazole-betamethasone (LOTRISONE) cream Apply 1 application topically  2 (two) times daily.  . dupilumab (DUPIXENT) 200 MG/1. prefilled syringe Inject 200 mg into the skin once.  . metoprolol succinate (TOPROL-XL) 25 MG 24 hr tablet TAKE 1 TABLET BY MOUTH EVERY DAY  . Multiple Vitamin (MULTIVITAMIN) tablet Take 1 tablet by mouth daily.  . Omega-3 Fatty Acids (FISH OIL) 1000 MG CPDR 3000 mg by mouth twice daily.  . TURMERIC PO Take 2 tablets by mouth daily.   . Vitamins C E (VITAMIN C & E COMBINATION PO) Take 1 capsule by mouth daily.  . [DISCONTINUED] COVID-19 mRNA vaccine, Pfizer, 30 MCG/0.3ML injection INJECT AS DIRECTED   No facility-administered encounter medications on file as of 07/21/2020.    Allergies (verified) Amlodipine   History: Past Medical History:  Diagnosis Date  . Cancer [facility]) 2011   melanoma --neck  . Essential hypertension 05/20/2018  . Malignant melanoma (HCC) 05/19/2020   stage 1 A, removed by Dr. Reginia Naas and refered to [facility] for wide excision  . Melanoma (HCC) 2022   Past Surgical History:  Procedure Laterality Date  . MOHS SURGERY  2011  . TONSILLECTOMY  1964  . TONSILLECTOMY     Family History  Problem Relation Age of Onset  . COPD Mother   . Angina Mother    Social History   Socioeconomic History  . Marital status: Widowed    Spouse name: Not on file  . Number of children: 0  . Years of education: Not on file  . Highest education level: Not on file  Occupational History  . Not on file  Tobacco Use  . Smoking status: Former Games developer  . Smokeless tobacco: Never Used  Substance and Sexual Activity  . Alcohol use: Yes    Comment: 2-4 beers per day  . Drug use: Not on file  . Sexual activity: Not on file  Other Topics Concern  . 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     He does not have children   Enjoys fishing/golf, recently caring for his wife.     Social Determinants of Health    Financial Resource Strain: Low Risk   . Difficulty of Paying Living Expenses: Not hard at all  Food Insecurity: No Food Insecurity  . Worried About Charity fundraiser in the Last Year: Never true  . Ran Out of Food in the Last Year: Never true  Transportation Needs: No Transportation Needs  . Lack of Transportation (Medical): No  . Lack of Transportation (Non-Medical): No  Physical Activity: Sufficiently Active  . Days of Exercise per Week: 7 days  . Minutes of Exercise per Session: 30 min  Stress: No Stress Concern Present  . Feeling of Stress : Not at all  Social Connections: Moderately Isolated  . Frequency of Communication with Friends and Family: More than three times a week  . Frequency of Social Gatherings with Friends and Family: More than three times a week  . Attends Religious Services: More than 4 times per year  . Active Member of Clubs or Organizations: No  . Attends Archivist Meetings: Never  . Marital Status: Widowed    Tobacco Counseling Counseling given: Not Answered   Clinical Intake:  Pre-visit preparation completed: Yes  Pain : No/denies pain     Nutritional Status: BMI of 19-24  Normal Nutritional Risks: None Diabetes: No  How often do you need to have someone help you when you read instructions, pamphlets, or other written materials from your doctor or pharmacy?: 1 - Never  Diabetic?No  Interpreter Needed?: No  Information entered by :: Caroleen Hamman LPN   Activities of Daily Living In your present state of health, do you have any difficulty performing the following activities: 07/21/2020  Hearing? N  Vision? N  Difficulty concentrating or making decisions? N  Walking or climbing stairs? N  Dressing or bathing? N  Doing errands, shopping? N  Preparing Food and eating ? N  Using the Toilet? N  In the past six months, have you accidently leaked urine? N  Do you have problems with loss of bowel control? N  Managing your  Medications? N  Managing your Finances? N  Housekeeping or managing your Housekeeping? N  Some recent data might be hidden    Patient Care Team: [name], NP as PCP - General (Internal Medicine)  Indicate any recent Medical Services you may have received from other than [facility] providers in the past year (date may be approximate).     Assessment:   This is a routine wellness examination for [place].  Hearing/Vision screen  Hearing Screening   125Hz  250Hz  500Hz  1000Hz  2000Hz  3000Hz  4000Hz  6000Hz  8000Hz   Right ear:           Left ear:           Comments: C/o mild hearing loss-right ear is worse  Vision Screening Comments: Wears glasses Last eye exam-2020  Dietary issues and exercise activities discussed: Current Exercise Habits: Home exercise routine, Type of exercise: walking (mows lawn with a  push mower), Time (Minutes): 30, Frequency (Times/Week): 7, Weekly Exercise (Minutes/Week): 210, Intensity: Mild, Exercise limited by: None identified  Goals Addressed            This Visit's Progress   . Increase physical activity   On track     Depression Screen PHQ 2/9 Scores 07/21/2020 06/16/2019 05/19/2019 01/08/2018 02/10/2016 12/27/2015  PHQ - 2 Score 0 0 0 0 0 0  PHQ- 9 Score - - - 0 - -    Fall Risk Fall Risk  07/21/2020 06/16/2019 05/19/2019 02/18/2018 02/10/2016  Falls in the past year? 0 0 0 0 No  Number falls in past yr: 0 0 0 - -  Injury with Fall? 0 0 0 0 -  Follow up Falls prevention discussed Education provided;Falls prevention discussed - - -    FALL RISK PREVENTION PERTAINING TO THE HOME:  Any stairs in or around the home? No  Home free of loose throw rugs in walkways, pet beds, electrical cords, etc? No  Adequate lighting in your home to reduce risk of falls? Yes   ASSISTIVE DEVICES UTILIZED TO PREVENT FALLS:  Life alert? No  Use of a cane, walker or w/c? No  Grab bars in the bathroom? Yes  Shower chair or bench in shower? No  Elevated toilet seat or a  handicapped toilet? No   TIMED UP AND GO:  Was the test performed? No . Phone visit  Cognitive Function:Normal cognitive status assessed by  this Nurse Health Advisor. No abnormalities found.          Immunizations Immunization History  Administered Date(s) Administered  . Fluad Quad(high Dose 65+) 11/18/2018, 11/24/2019  . Influenza, High Dose Seasonal PF 01/08/2018  . Influenza,inj,Quad PF,6+ Mos 12/08/2012, 12/08/2013, 12/27/2015, 12/31/2016  . PFIZER(Purple Top)SARS-COV-2 Vaccination 05/27/2019, 06/17/2019, 02/09/2020  . Pneumococcal Conjugate-13 01/08/2018  . Pneumococcal Polysaccharide-23 11/24/2019  . Td 02/10/2016  . Tdap 03/12/2006  . Zoster 12/08/2013    TDAP status: Up to date  Flu Vaccine status: Up to date  Pneumococcal vaccine status: Up to date  Covid-19 vaccine status: Completed vaccines  Qualifies for Shingles Vaccine? Yes   Zostavax completed Yes   Shingrix Completed?: No.    Education has been provided regarding the importance of this vaccine. Patient has been advised to call insurance company to determine out of pocket expense if they have not yet received this vaccine. Advised may also receive vaccine at local pharmacy or Health Dept. Verbalized acceptance and understanding.  Screening Tests Health Maintenance  Topic Date Due  . COVID-19 Vaccine (4 - Booster for [place] series) 05/09/2020  . COLONOSCOPY (Pts 45-88yrs Insurance coverage will need to be confirmed)  02/20/2021 (Originally 09/23/1997)  . INFLUENZA VACCINE  10/10/2020  . COLON CANCER SCREENING ANNUAL FOBT  01/14/2021  . TETANUS/TDAP  02/09/2026  . Hepatitis C Screening  Completed  . PNA vac Low Risk Adult  Completed  . HPV VACCINES  Aged Out    Health Maintenance  Health Maintenance Due  Topic Date Due  . COVID-19 Vaccine (4 - Booster for Pfizer series) 05/09/2020    Colorectal cancer screening: Type of screening: FOBT/FIT. Completed 01/15/2020. Repeat every 1 years  Lung  Cancer Screening: (Low Dose CT Chest recommended if Age 52-80 years, 30 pack-year currently smoking OR have quit w/in 15years.) does not qualify.    Additional Screening:  Hepatitis C Screening: Completed 09/17/2017  Vision Screening: Recommended annual ophthalmology exams for early detection of glaucoma and other disorders of the eye.  Is the patient up to date with their annual eye exam?  No  Who is the provider or what is the name of the office in which the patient attends annual eye exams? unknown Patient plans to make an appt  Dental Screening: Recommended annual dental exams for proper oral hygiene  Community Resource Referral / Chronic Care Management: CRR required this visit?  No   CCM required this visit?  No      Plan:     I have personally reviewed and noted the following in the patient's chart:   . Medical and social history . Use of alcohol, tobacco or illicit drugs  . Current medications and supplements including opioid prescriptions. Patient is not currently taking opioid prescriptions. . Functional ability and status . Nutritional status . Physical activity . Advanced directives . List of other physicians . Hospitalizations, surgeries, and ER visits in previous 12 months . Vitals . Screenings to include cognitive, depression, and falls . Referrals and appointments  In addition, I have reviewed and discussed with patient certain preventive protocols, quality metrics, and best practice recommendations. A written personalized care plan for preventive services as well as general preventive health recommendations were provided to patient.   Due to this being a telephonic visit, the after visit summary with patients personalized plan was offered to patient via mail or my-chart.  Patient would like to access on my-chart.    Marta Antu, LPN   3/00/9233  Nurse Health Advisor  Nurse Notes: None   Medical screening examination/treatment/procedure(s) were  performed by non-physician practitioner and as supervising provider I was immediately available for consultation/collaboration.  I agree with above. Marrian Salvage, FNP

## 2020-07-21 ENCOUNTER — Ambulatory Visit (INDEPENDENT_AMBULATORY_CARE_PROVIDER_SITE_OTHER): Payer: Medicare HMO

## 2020-07-21 VITALS — Ht 70.0 in | Wt 170.0 lb

## 2020-07-21 DIAGNOSIS — Z0000 Encounter for general adult medical examination without abnormal findings: Secondary | ICD-10-CM | POA: Diagnosis not present

## 2020-07-21 NOTE — Patient Instructions (Signed)
Jose Strong , Thank you for taking time to complete your Medicare Wellness Visit. I appreciate your ongoing commitment to your health goals. Please review the following plan we discussed and let me know if I can assist you in the future.   Screening recommendations/referrals: Colonoscopy: Completed FOBT 01/2020-Due 01/2021 Recommended yearly ophthalmology/optometry visit for glaucoma screening and checkup Recommended yearly dental visit for hygiene and checkup  Vaccinations: Influenza vaccine: Up to date Pneumococcal vaccine: Completed vaccines Tdap vaccine: Up to date-Due 02/09/2026 Shingles vaccine: Discuss with pharmacy   Covid-19: Up to date  Advanced directives: Documents completed  Conditions/risks identified: See problem list  Next appointment: Follow up in one year for your annual wellness visit.   Preventive Care 4 Years and Older, Male Preventive care refers to lifestyle choices and visits with your health care provider that can promote health and wellness. What does preventive care include?  A yearly physical exam. This is also called an annual well check.  Dental exams once or twice a year.  Routine eye exams. Ask your health care provider how often you should have your eyes checked.  Personal lifestyle choices, including:  Daily care of your teeth and gums.  Regular physical activity.  Eating a healthy diet.  Avoiding tobacco and drug use.  Limiting alcohol use.  Practicing safe sex.  Taking low doses of aspirin every day.  Taking vitamin and mineral supplements as recommended by your health care provider. What happens during an annual well check? The services and screenings done by your health care provider during your annual well check will depend on your age, overall health, lifestyle risk factors, and family history of disease. Counseling  Your health care provider may ask you questions about your:  Alcohol use.  Tobacco use.  Drug  use.  Emotional well-being.  Home and relationship well-being.  Sexual activity.  Eating habits.  History of falls.  Memory and ability to understand (cognition).  Work and work Statistician. Screening  You may have the following tests or measurements:  Height, weight, and BMI.  Blood pressure.  Lipid and cholesterol levels. These may be checked every 5 years, or more frequently if you are over 59 years old.  Skin check.  Lung cancer screening. You may have this screening every year starting at age 5 if you have a 30-pack-year history of smoking and currently smoke or have quit within the past 15 years.  Fecal occult blood test (FOBT) of the stool. You may have this test every year starting at age 73.  Flexible sigmoidoscopy or colonoscopy. You may have a sigmoidoscopy every 5 years or a colonoscopy every 10 years starting at age 58.  Prostate cancer screening. Recommendations will vary depending on your family history and other risks.  Hepatitis C blood test.  Hepatitis B blood test.  Sexually transmitted disease (STD) testing.  Diabetes screening. This is done by checking your blood sugar (glucose) after you have not eaten for a while (fasting). You may have this done every 1-3 years.  Abdominal aortic aneurysm (AAA) screening. You may need this if you are a current or former smoker.  Osteoporosis. You may be screened starting at age 51 if you are at high risk. Talk with your health care provider about your test results, treatment options, and if necessary, the need for more tests. Vaccines  Your health care provider may recommend certain vaccines, such as:  Influenza vaccine. This is recommended every year.  Tetanus, diphtheria, and acellular pertussis (Tdap, Td) vaccine.  You may need a Td booster every 10 years.  Zoster vaccine. You may need this after age 29.  Pneumococcal 13-valent conjugate (PCV13) vaccine. One dose is recommended after age  37.  Pneumococcal polysaccharide (PPSV23) vaccine. One dose is recommended after age 102. Talk to your health care provider about which screenings and vaccines you need and how often you need them. This information is not intended to replace advice given to you by your health care provider. Make sure you discuss any questions you have with your health care provider. Document Released: 03/25/2015 Document Revised: 11/16/2015 Document Reviewed: 12/28/2014 Elsevier Interactive Patient Education  2017 [place] Prevention in the Home Falls can cause injuries. They can happen to people of all ages. There are many things you can do to make your home safe and to help prevent falls. What can I do on the outside of my home?  Regularly fix the edges of walkways and driveways and fix any cracks.  Remove anything that might make you trip as you walk through a door, such as a raised step or threshold.  Trim any bushes or trees on the path to your home.  Use bright outdoor lighting.  Clear any walking paths of anything that might make someone trip, such as rocks or tools.  Regularly check to see if handrails are loose or broken. Make sure that both sides of any steps have handrails.  Any raised decks and porches should have guardrails on the edges.  Have any leaves, snow, or ice cleared regularly.  Use sand or salt on walking paths during winter.  Clean up any spills in your garage right away. This includes oil or grease spills. What can I do in the bathroom?  Use night lights.  Install grab bars by the toilet and in the tub and shower. Do not use towel bars as grab bars.  Use non-skid mats or decals in the tub or shower.  If you need to sit down in the shower, use a plastic, non-slip stool.  Keep the floor dry. Clean up any water that spills on the floor as soon as it happens.  Remove soap buildup in the tub or shower regularly.  Attach bath mats securely with double-sided  non-slip rug tape.  Do not have throw rugs and other things on the floor that can make you trip. What can I do in the bedroom?  Use night lights.  Make sure that you have a light by your bed that is easy to reach.  Do not use any sheets or blankets that are too big for your bed. They should not hang down onto the floor.  Have a firm chair that has side arms. You can use this for support while you get dressed.  Do not have throw rugs and other things on the floor that can make you trip. What can I do in the kitchen?  Clean up any spills right away.  Avoid walking on wet floors.  Keep items that you use a lot in easy-to-reach places.  If you need to reach something above you, use a strong step stool that has a grab bar.  Keep electrical cords out of the way.  Do not use floor polish or wax that makes floors slippery. If you must use wax, use non-skid floor wax.  Do not have throw rugs and other things on the floor that can make you trip. What can I do with my stairs?  Do not leave any  items on the stairs.  Make sure that there are handrails on both sides of the stairs and use them. Fix handrails that are broken or loose. Make sure that handrails are as long as the stairways.  Check any carpeting to make sure that it is firmly attached to the stairs. Fix any carpet that is loose or worn.  Avoid having throw rugs at the top or bottom of the stairs. If you do have throw rugs, attach them to the floor with carpet tape.  Make sure that you have a light switch at the top of the stairs and the bottom of the stairs. If you do not have them, ask someone to add them for you. What else can I do to help prevent falls?  Wear shoes that:  Do not have high heels.  Have rubber bottoms.  Are comfortable and fit you well.  Are closed at the toe. Do not wear sandals.  If you use a stepladder:  Make sure that it is fully opened. Do not climb a closed stepladder.  Make sure that both  sides of the stepladder are locked into place.  Ask someone to hold it for you, if possible.  Clearly mark and make sure that you can see:  Any grab bars or handrails.  First and last steps.  Where the edge of each step is.  Use tools that help you move around (mobility aids) if they are needed. These include:  Canes.  Walkers.  Scooters.  Crutches.  Turn on the lights when you go into a dark area. Replace any light bulbs as soon as they burn out.  Set up your furniture so you have a clear path. Avoid moving your furniture around.  If any of your floors are uneven, fix them.  If there are any pets around you, be aware of where they are.  Review your medicines with your doctor. Some medicines can make you feel dizzy. This can increase your chance of falling. Ask your doctor what other things that you can do to help prevent falls. This information is not intended to replace advice given to you by your health care provider. Make sure you discuss any questions you have with your health care provider. Document Released: 12/23/2008 Document Revised: 08/04/2015 Document Reviewed: 04/02/2014 Elsevier Interactive Patient Education  2017 [employer].

## 2020-07-22 DIAGNOSIS — L989 Disorder of the skin and subcutaneous tissue, unspecified: Secondary | ICD-10-CM | POA: Diagnosis not present

## 2020-07-22 DIAGNOSIS — C434 Malignant melanoma of scalp and neck: Secondary | ICD-10-CM | POA: Diagnosis not present

## 2020-07-23 ENCOUNTER — Other Ambulatory Visit: Payer: Self-pay | Admitting: Family

## 2020-07-27 DIAGNOSIS — M7989 Other specified soft tissue disorders: Secondary | ICD-10-CM | POA: Diagnosis not present

## 2020-07-27 DIAGNOSIS — L905 Scar conditions and fibrosis of skin: Secondary | ICD-10-CM | POA: Diagnosis not present

## 2020-08-12 DIAGNOSIS — M62830 Muscle spasm of back: Secondary | ICD-10-CM | POA: Diagnosis not present

## 2020-08-12 DIAGNOSIS — M9901 Segmental and somatic dysfunction of cervical region: Secondary | ICD-10-CM | POA: Diagnosis not present

## 2020-08-12 DIAGNOSIS — M5413 Radiculopathy, cervicothoracic region: Secondary | ICD-10-CM | POA: Diagnosis not present

## 2020-08-12 DIAGNOSIS — M5417 Radiculopathy, lumbosacral region: Secondary | ICD-10-CM | POA: Diagnosis not present

## 2020-08-12 DIAGNOSIS — M9903 Segmental and somatic dysfunction of lumbar region: Secondary | ICD-10-CM | POA: Diagnosis not present

## 2020-08-23 DIAGNOSIS — Z85820 Personal history of malignant melanoma of skin: Secondary | ICD-10-CM | POA: Diagnosis not present

## 2020-08-23 DIAGNOSIS — L2089 Other atopic dermatitis: Secondary | ICD-10-CM | POA: Diagnosis not present

## 2020-08-23 DIAGNOSIS — L820 Inflamed seborrheic keratosis: Secondary | ICD-10-CM | POA: Diagnosis not present

## 2020-09-02 DIAGNOSIS — M5417 Radiculopathy, lumbosacral region: Secondary | ICD-10-CM | POA: Diagnosis not present

## 2020-09-02 DIAGNOSIS — M9903 Segmental and somatic dysfunction of lumbar region: Secondary | ICD-10-CM | POA: Diagnosis not present

## 2020-09-02 DIAGNOSIS — M5413 Radiculopathy, cervicothoracic region: Secondary | ICD-10-CM | POA: Diagnosis not present

## 2020-09-02 DIAGNOSIS — M9901 Segmental and somatic dysfunction of cervical region: Secondary | ICD-10-CM | POA: Diagnosis not present

## 2020-09-02 DIAGNOSIS — M62830 Muscle spasm of back: Secondary | ICD-10-CM | POA: Diagnosis not present

## 2020-09-30 DIAGNOSIS — M9901 Segmental and somatic dysfunction of cervical region: Secondary | ICD-10-CM | POA: Diagnosis not present

## 2020-09-30 DIAGNOSIS — M5413 Radiculopathy, cervicothoracic region: Secondary | ICD-10-CM | POA: Diagnosis not present

## 2020-09-30 DIAGNOSIS — M5417 Radiculopathy, lumbosacral region: Secondary | ICD-10-CM | POA: Diagnosis not present

## 2020-09-30 DIAGNOSIS — M9903 Segmental and somatic dysfunction of lumbar region: Secondary | ICD-10-CM | POA: Diagnosis not present

## 2020-09-30 DIAGNOSIS — M62830 Muscle spasm of back: Secondary | ICD-10-CM | POA: Diagnosis not present

## 2020-10-21 DIAGNOSIS — M9901 Segmental and somatic dysfunction of cervical region: Secondary | ICD-10-CM | POA: Diagnosis not present

## 2020-10-21 DIAGNOSIS — M62830 Muscle spasm of back: Secondary | ICD-10-CM | POA: Diagnosis not present

## 2020-10-21 DIAGNOSIS — M5413 Radiculopathy, cervicothoracic region: Secondary | ICD-10-CM | POA: Diagnosis not present

## 2020-10-21 DIAGNOSIS — M5417 Radiculopathy, lumbosacral region: Secondary | ICD-10-CM | POA: Diagnosis not present

## 2020-10-21 DIAGNOSIS — M9903 Segmental and somatic dysfunction of lumbar region: Secondary | ICD-10-CM | POA: Diagnosis not present

## 2020-11-09 DIAGNOSIS — M62830 Muscle spasm of back: Secondary | ICD-10-CM | POA: Diagnosis not present

## 2020-11-09 DIAGNOSIS — M5417 Radiculopathy, lumbosacral region: Secondary | ICD-10-CM | POA: Diagnosis not present

## 2020-11-09 DIAGNOSIS — M5413 Radiculopathy, cervicothoracic region: Secondary | ICD-10-CM | POA: Diagnosis not present

## 2020-11-09 DIAGNOSIS — M9901 Segmental and somatic dysfunction of cervical region: Secondary | ICD-10-CM | POA: Diagnosis not present

## 2020-11-09 DIAGNOSIS — M9903 Segmental and somatic dysfunction of lumbar region: Secondary | ICD-10-CM | POA: Diagnosis not present

## 2020-11-25 ENCOUNTER — Ambulatory Visit: Payer: Medicare HMO | Admitting: Family

## 2020-12-06 ENCOUNTER — Ambulatory Visit (INDEPENDENT_AMBULATORY_CARE_PROVIDER_SITE_OTHER): Payer: Medicare HMO | Admitting: Family

## 2020-12-06 ENCOUNTER — Other Ambulatory Visit: Payer: Self-pay

## 2020-12-06 ENCOUNTER — Encounter: Payer: Self-pay | Admitting: Family

## 2020-12-06 VITALS — BP 172/94 | HR 66 | Temp 98.5°F | Resp 16 | Ht 70.0 in | Wt 171.0 lb

## 2020-12-06 DIAGNOSIS — R972 Elevated prostate specific antigen [PSA]: Secondary | ICD-10-CM | POA: Insufficient documentation

## 2020-12-06 DIAGNOSIS — Z23 Encounter for immunization: Secondary | ICD-10-CM

## 2020-12-06 DIAGNOSIS — C439 Malignant melanoma of skin, unspecified: Secondary | ICD-10-CM | POA: Diagnosis not present

## 2020-12-06 DIAGNOSIS — I10 Essential (primary) hypertension: Secondary | ICD-10-CM | POA: Diagnosis not present

## 2020-12-06 DIAGNOSIS — E785 Hyperlipidemia, unspecified: Secondary | ICD-10-CM

## 2020-12-06 LAB — LIPID PANEL
Cholesterol: 205 mg/dL — ABNORMAL HIGH (ref 0–200)
HDL: 53.1 mg/dL (ref >39.00)
LDL Cholesterol: 115 mg/dL — ABNORMAL HIGH (ref 0–99)
NonHDL: 152.25
Total CHOL/HDL Ratio: 4
Triglycerides: 187 mg/dL — ABNORMAL HIGH (ref 0.0–149.0)
VLDL: 37.4 mg/dL (ref 0.0–40.0)

## 2020-12-06 LAB — BASIC METABOLIC PANEL
BUN: 12 mg/dL (ref 6–23)
CO2: 26 mEq/L (ref 19–32)
Calcium: 9.5 mg/dL (ref 8.4–10.5)
Chloride: 101 mEq/L (ref 96–112)
Creatinine, Ser: 0.75 mg/dL (ref 0.40–1.50)
GFR: 92.92 mL/min (ref >60.00)
Glucose, Bld: 98 mg/dL (ref 70–99)
Potassium: 4.1 mEq/L (ref 3.5–5.1)
Sodium: 137 mEq/L (ref 135–145)

## 2020-12-06 LAB — PSA: PSA: 4.44 ng/mL — ABNORMAL HIGH (ref 0.10–4.00)

## 2020-12-06 MED ORDER — ZOSTER VAC RECOMB ADJUVANTED 50 MCG/0.5ML IM SUSR: 0.5000 mL | Freq: Once | INTRAMUSCULAR | 1 refills | Status: AC

## 2020-12-06 MED ORDER — METOPROLOL SUCCINATE ER 50 MG PO TB24: 50.0000 mg | ORAL_TABLET | Freq: Every day | ORAL | 0 refills | Status: DC

## 2020-12-06 NOTE — Assessment & Plan Note (Signed)
Mildly elevated at life screening.  Repeat. If persistently elevated plan referral to urology.

## 2020-12-06 NOTE — Assessment & Plan Note (Addendum)
BP Readings from Last 3 Encounters:  12/06/20 (!) 172/94  05/24/20 140/86  11/24/19 135/84   Uncontrolled. Continues metoprolol 25mg  once daily. Will increase to toprol xl 50mg  once daily.

## 2020-12-06 NOTE — Assessment & Plan Note (Addendum)
Lab Results  Component Value Date   CHOL 221 (H) 05/24/2020   HDL 61.90 05/24/2020   LDLCALC 146 (H) 05/24/2020   LDLDIRECT 94.0 04/25/2017   TRIG 68.0 05/24/2020   CHOLHDL 4 05/24/2020   Uncontrolled.

## 2020-12-06 NOTE — Assessment & Plan Note (Addendum)
Follows with dermatology and reports recent Moh's surgery for malignant melanoma insitue.

## 2020-12-06 NOTE — Patient Instructions (Signed)
Please complete lab work prior to leaving. Increase toprol xl from 25mg  to 50mg  once daily.

## 2020-12-06 NOTE — Progress Notes (Signed)
Subjective:   By signing my name below, I, Jose Strong, attest that this documentation has been prepared under the direction and in the presence of [name], NP, 12/06/2020   Patient ID: Jose Strong, male    DOB: 1950/05/21, 70 y.o.   MRN: [id number]  Chief Complaint  Patient presents with   Hyperlipidemia    Here for follow up    HPI Patient is in today for an office visit.   Non-invasive melanoma: He reports having a non-invasive melanoma removed in may of 2022.  Life screening: He has recently gotten a life screening and is concerned with his increased PSA levels.  Immunizations: He has received his flu shot in the office today. He is interested in receiving the updated Covid-19 vaccination at another time. He is also interested in getting his Shingrix vaccination at a pharmacy.  Sexual Activity: He has noted being in a new relationship and an increase in sexual activity.  Blood Pressure: He notes that he has been recording his blood pressure at home and his chiropractor has also been recording it and no high reports have been seen but in the office today his blood pressure it high. His blood pressure was re-recorded at 162/90 today. He is compliant with taking his 25 mg metoprolol. BP Readings from Last 3 Encounters:  12/06/20 (!) 172/94  05/24/20 140/86  11/24/19 135/84   Medication: He is compliant in taking in taking his 25 mg metoprolol and 200 mg dupixent.   Health Maintenance Due  Topic Date Due   Zoster Vaccines- Shingrix (1 of 2) Never done   COVID-19 Vaccine (4 - Booster for [employer] series) 05/03/2020   INFLUENZA VACCINE  10/10/2020    Past Medical History:  Diagnosis Date   Cancer ([place]) 2009-06-14   melanoma --neck   Essential hypertension 05/20/2018   Malignant melanoma ([place]) 05/19/2020   stage 1 A, removed by Dr. Orbie Hurst and refered to [facility] for wide excision   Melanoma [facility]) 06/14/2020    Past Surgical History:  Procedure Laterality Date    MOHS SURGERY  03/12/2009   MOHS SURGERY Right 07/2020   non-invasive melanoma   TONSILLECTOMY  03/12/1962   TONSILLECTOMY      Family History  Problem Relation Age of Onset   COPD Mother    Angina Mother     Social History   Socioeconomic History   Marital status: Widowed    Spouse name: Not on file   Number of children: 0   Years of education: Not on file   Highest education level: Not on file  Occupational History   Not on file  Tobacco Use   Smoking status: Former   Smokeless tobacco: Never  Substance and Sexual Activity   Alcohol use: Yes    Comment: 2-4 beers per day   Drug use: Not on file   Sexual activity: Not on file  Other Topics Concern   Not on file  Social History Narrative   Wife died 06/15/14, had cancer   Unemployed    Completed HS   Not working   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     He does not have children   Enjoys fishing/golf, recently caring for his wife.     Social Determinants of Health   Financial Resource Strain: Low Risk    Difficulty of Paying Living Expenses: Not hard at all  Food Insecurity: No Food Insecurity   Worried About Charity fundraiser in the Last Year:  Never true   Ran Out of Food in the Last Year: Never true  Transportation Needs: No Transportation Needs   Lack of Transportation (Medical): No   Lack of Transportation (Non-Medical): No  Physical Activity: Sufficiently Active   Days of Exercise per Week: 7 days   Minutes of Exercise per Session: 30 min  Stress: No Stress Concern Present   Feeling of Stress : Not at all  Social Connections: Moderately Isolated   Frequency of Communication with Friends and Family: More than three times a week   Frequency of Social Gatherings with Friends and Family: More than three times a week   Attends Religious Services: More than 4 times per year   Active Member of [employer] or Organizations: No   Attends Archivist Meetings: Never   Marital Status: Widowed  Arboriculturist Violence: Not At Risk   Fear of Current or Ex-Partner: No   Emotionally Abused: No   Physically Abused: No   Sexually Abused: No    Outpatient Medications Prior to Visit  Medication Sig Dispense Refill   Calcium-Magnesium-Zinc 167-83-8 MG TABS Take 1 tablet by mouth daily.     clotrimazole-betamethasone (LOTRISONE) cream Apply 1 application topically 2 (two) times daily. 45 g 1   dupilumab (DUPIXENT) 200 MG/1.14ML prefilled syringe Inject 200 mg into the skin once.     Multiple Vitamin (MULTIVITAMIN) tablet Take 1 tablet by mouth daily.     Omega-3 Fatty Acids (FISH OIL) 1000 MG CPDR 3000 mg by mouth twice daily.     metoprolol succinate (TOPROL-XL) 25 MG 24 hr tablet TAKE 1 TABLET BY MOUTH EVERY DAY 90 tablet 1   TURMERIC PO Take 2 tablets by mouth daily.      Vitamins C E (VITAMIN C & E COMBINATION PO) Take 1 capsule by mouth daily.     No facility-administered medications prior to visit.    Allergies  Allergen Reactions   Amlodipine     rash    ROS     Objective:    Physical Exam Constitutional:      General: He is not in acute distress.    Appearance: Normal appearance. He is not ill-appearing.  HENT:     Head: Normocephalic and atraumatic.     Right Ear: External ear normal.     Left Ear: External ear normal.  Eyes:     Extraocular Movements: Extraocular movements intact.     Pupils: Pupils are equal, round, and reactive to light.  Cardiovascular:     Rate and Rhythm: Normal rate and regular rhythm.     Heart sounds: Normal heart sounds. No murmur heard.   No gallop.  Pulmonary:     Effort: Pulmonary effort is normal. No respiratory distress.     Breath sounds: Normal breath sounds. No wheezing or rales.  Skin:    General: Skin is warm and dry.  Neurological:     Mental Status: He is alert and oriented to person, place, and time.  Psychiatric:        Behavior: Behavior normal.        Judgment: Judgment normal.    BP (!) 172/94 (BP Location: Left  Arm, Patient Position: Sitting, Cuff Size: Small)   Pulse 66   Temp 98.5 F (36.9 C) (Oral)   Resp 16   Ht 5\' 10"  (1.778 m)   Wt 171 lb (77.6 kg)   SpO2 99%   BMI 24.54 kg/m  Wt Readings from Last 3 Encounters:  12/06/20 171 lb (77.6 kg)  07/21/20 170 lb (77.1 kg)  05/24/20 170 lb (77.1 kg)       Assessment & Plan:   Problem List Items Addressed This Visit       Unprioritized   Malignant melanoma ([place])    Follows with dermatology and reports recent Moh's surgery for malignant melanoma insitue.       Hyperlipidemia    Lab Results  Component Value Date   CHOL 221 (H) 05/24/2020   HDL 61.90 05/24/2020   LDLCALC 146 (H) 05/24/2020   LDLDIRECT 94.0 04/25/2017   TRIG 68.0 05/24/2020   CHOLHDL 4 05/24/2020  Uncontrolled.       Relevant Medications   metoprolol succinate (TOPROL-XL) 50 MG 24 hr tablet   Other Relevant Orders   Lipid panel   Essential hypertension    BP Readings from Last 3 Encounters:  12/06/20 (!) 172/94  05/24/20 140/86  11/24/19 135/84  Uncontrolled. Continues metoprolol 25mg  once daily. Will increase to toprol xl 50mg  once daily.       Relevant Medications   metoprolol succinate (TOPROL-XL) 50 MG 24 hr tablet   Other Relevant Orders   Basic metabolic panel   Elevated PSA    Mildly elevated at life screening.  Repeat. If persistently elevated plan referral to urology.       Relevant Orders   PSA   Other Visit Diagnoses     Needs flu shot    -  Primary   Relevant Orders   Flu Vaccine QUAD High Dose(Fluad)      Meds ordered this encounter  Medications   metoprolol succinate (TOPROL-XL) 50 MG 24 hr tablet    Sig: Take 1 tablet (50 mg total) by mouth daily. Take with or immediately following a meal.    Dispense:  90 tablet    Refill:  0    Order Specific Question:   Supervising Provider    Answer:   Penni Homans A [[id number]]   Zoster Vaccine Adjuvanted [facility]) injection    Sig: Inject 0.5 mLs into the muscle once for 1 dose.  Repeat dose in 2-6 months.    Dispense:  0.5 mL    Refill:  1    Order Specific Question:   Supervising Provider    Answer:   Penni Homans A [[id number]]    I, [name], NP, personally preformed the services described in this documentation.  All medical record entries made by the scribe were at my direction and in my presence.  I have reviewed the chart and discharge instructions (if applicable) and agree that the record reflects my personal performance and is accurate and complete. 12/06/2020  I,Jose Strong,acting as a scribe for Nance Pear, NP.,have documented all relevant documentation on the behalf of Nance Pear, NP,as directed by  Nance Pear, NP while in the presence of Nance Pear, NP.  Nance Pear, NP

## 2020-12-07 ENCOUNTER — Telehealth: Payer: Self-pay | Admitting: Family

## 2020-12-07 DIAGNOSIS — R972 Elevated prostate specific antigen [PSA]: Secondary | ICD-10-CM

## 2020-12-07 NOTE — Telephone Encounter (Signed)
Please advise pt that triglycerides are mildly elevated.  Please work on avoiding concentrated sweets, and limiting white carbs (rice/bread/pasta/potatoes).  Instead substitute whole grain versions with reasonable portions.  Also, PSA remains mildly elevated. I would recommend referral to urology for further discussion/evaluation.

## 2020-12-08 NOTE — Telephone Encounter (Signed)
Patient advised of results, provider's comments and referral. He verbalized understanding.

## 2020-12-13 ENCOUNTER — Other Ambulatory Visit: Payer: Self-pay

## 2020-12-13 ENCOUNTER — Ambulatory Visit (INDEPENDENT_AMBULATORY_CARE_PROVIDER_SITE_OTHER): Payer: Medicare HMO | Admitting: Family

## 2020-12-13 VITALS — BP 148/88 | HR 66 | Temp 98.6°F | Resp 18 | Wt 168.4 lb

## 2020-12-13 DIAGNOSIS — I10 Essential (primary) hypertension: Secondary | ICD-10-CM | POA: Diagnosis not present

## 2020-12-13 DIAGNOSIS — R972 Elevated prostate specific antigen [PSA]: Secondary | ICD-10-CM | POA: Diagnosis not present

## 2020-12-13 MED ORDER — METOPROLOL SUCCINATE ER 50 MG PO TB24: 50.0000 mg | ORAL_TABLET | Freq: Every day | ORAL | 0 refills | Status: DC

## 2020-12-13 NOTE — Assessment & Plan Note (Signed)
Pt is scheduled to see [facility].

## 2020-12-13 NOTE — Patient Instructions (Signed)
Please continue current dose of metoprolol.   

## 2020-12-13 NOTE — Progress Notes (Signed)
Subjective:   By signing my name below, I, [name], attest that this documentation has been prepared under the direction and in the presence of [name], NP, 12/13/2020    Patient ID: Jose Strong, male    DOB: 1950/06/23, 70 y.o.   MRN: [id number]  Chief Complaint  Patient presents with   PHQ-9 12 Week Follow-up    Concerns/ questions: none     HPI Patient is in today for an office visit.   Blood Pressure: During his last visit he was prescribed 50 mg metoprolol. He is experiencing no adverse side effects on this medication and notes a decreased blood pressure. He says that he is feeling great.  BP Readings from Last 3 Encounters:  12/13/20 (!) 148/88  12/06/20 (!) 172/94  05/24/20 140/86    PSA: His PSA levels were high when evaluated during his last visit and has scheduled an appointment in November 2022 with his urologist.  Immunizations: He received his flu shot during his previous visit. He is aware of being due for his Shingrix vaccine and the updated Covid-19 booster shot but notes he will do these at a later date.   Health Maintenance Due  Topic Date Due   Zoster Vaccines- Shingrix (1 of 2) Never done   COVID-19 Vaccine (4 - Booster for [employer] series) 05/03/2020    Past Medical History:  Diagnosis Date   Cancer ([place]) 05/30/09   melanoma --neck   Essential hypertension 05/20/2018   Malignant melanoma ([place]) 05/19/2020   stage 1 A, removed by Dr. Orbie Hurst and refered to [facility] for wide excision   Melanoma [facility]) 05/30/20    Past Surgical History:  Procedure Laterality Date   MOHS SURGERY  03/12/2009   MOHS SURGERY Right 07/2020   non-invasive melanoma   TONSILLECTOMY  03/12/1962   TONSILLECTOMY      Family History  Problem Relation Age of Onset   COPD Mother    Angina Mother     Social History   Socioeconomic History   Marital status: Widowed    Spouse name: Not on file   Number of children: 0   Years of education: Not on file    Highest education level: Not on file  Occupational History   Not on file  Tobacco Use   Smoking status: Former   Smokeless tobacco: Never  Substance and Sexual Activity   Alcohol use: Yes    Comment: 2-4 beers per day   Drug use: Not on file   Sexual activity: Not on file  Other Topics Concern   Not on file  Social History Narrative   Wife died 05/31/14, had cancer   Unemployed    Completed HS   Not working   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     He does not have children   Enjoys fishing/golf, recently caring for his wife.     Social Determinants of Health   Financial Resource Strain: Low Risk    Difficulty of Paying Living Expenses: Not hard at all  Food Insecurity: No Food Insecurity   Worried About Charity fundraiser in the Last Year: Never true   [place] in the Last Year: Never true  Transportation Needs: No Transportation Needs   Lack of Transportation (Medical): No   Lack of Transportation (Non-Medical): No  Physical Activity: Sufficiently Active   Days of Exercise per Week: 7 days   Minutes of Exercise per Session: 30 min  Stress: No Stress  Concern Present   Feeling of Stress : Not at all  Social Connections: Moderately Isolated   Frequency of Communication with Friends and Family: More than three times a week   Frequency of Social Gatherings with Friends and Family: More than three times a week   Attends Religious Services: More than 4 times per year   Active Member of [employer] or Organizations: No   Attends Archivist Meetings: Never   Marital Status: Widowed  Human resources officer Violence: Not At Risk   Fear of Current or Ex-Partner: No   Emotionally Abused: No   Physically Abused: No   Sexually Abused: No    Outpatient Medications Prior to Visit  Medication Sig Dispense Refill   Calcium-Magnesium-Zinc 167-83-8 MG TABS Take 1 tablet by mouth daily.     clotrimazole-betamethasone (LOTRISONE) cream Apply 1 application topically 2 (two) times  daily. 45 g 1   dupilumab (DUPIXENT) 200 MG/1.14ML prefilled syringe Inject 200 mg into the skin once.     Multiple Vitamin (MULTIVITAMIN) tablet Take 1 tablet by mouth daily.     Omega-3 Fatty Acids (FISH OIL) 1000 MG CPDR 3000 mg by mouth twice daily.     metoprolol succinate (TOPROL-XL) 50 MG 24 hr tablet Take 1 tablet (50 mg total) by mouth daily. Take with or immediately following a meal. 90 tablet 0   No facility-administered medications prior to visit.    Allergies  Allergen Reactions   Amlodipine     rash    ROS See HPI     Objective:    Physical Exam Constitutional:      General: He is not in acute distress.    Appearance: Normal appearance. He is not ill-appearing.  HENT:     Head: Normocephalic and atraumatic.     Right Ear: External ear normal.     Left Ear: External ear normal.  Eyes:     Extraocular Movements: Extraocular movements intact.     Pupils: Pupils are equal, round, and reactive to light.  Cardiovascular:     Rate and Rhythm: Normal rate and regular rhythm.     Heart sounds: Normal heart sounds. No murmur heard.   No gallop.  Pulmonary:     Effort: Pulmonary effort is normal. No respiratory distress.     Breath sounds: Normal breath sounds. No wheezing or rales.  Skin:    General: Skin is warm and dry.  Neurological:     Mental Status: He is alert and oriented to person, place, and time.  Psychiatric:        Behavior: Behavior normal.        Judgment: Judgment normal.    BP (!) 148/88 (BP Location: Left Arm, Patient Position: Sitting, Cuff Size: Normal)   Pulse 66   Temp 98.6 F (37 C) (Oral)   Resp 18   Wt 168 lb 6.4 oz (76.4 kg)   SpO2 100%   BMI 24.16 kg/m  Wt Readings from Last 3 Encounters:  12/13/20 168 lb 6.4 oz (76.4 kg)  12/06/20 171 lb (77.6 kg)  07/21/20 170 lb (77.1 kg)       Assessment & Plan:   Problem List Items Addressed This Visit       Unprioritized   Essential hypertension - Primary    BP Readings from  Last 3 Encounters:  12/13/20 (!) 148/88  12/06/20 (!) 172/94  05/24/20 140/86  BP is improved on toprol xl 50mg .  Advised pt to continue current dose. Monitor bp at  home several times/week and let me know if he is getting consistent sbp's >150.  Pt verbalizes understanding.      Relevant Medications   metoprolol succinate (TOPROL-XL) 50 MG 24 hr tablet   Elevated PSA    Pt is scheduled to see [facility].       Meds ordered this encounter  Medications   metoprolol succinate (TOPROL-XL) 50 MG 24 hr tablet    Sig: Take 1 tablet (50 mg total) by mouth daily. Take with or immediately following a meal.    Dispense:  90 tablet    Refill:  0    Order Specific Question:   Supervising Provider    Answer:   Penni Homans A [[id number]]    I, [name], NP, personally preformed the services described in this documentation.  All medical record entries made by the scribe were at my direction and in my presence.  I have reviewed the chart and discharge instructions (if applicable) and agree that the record reflects my personal performance and is accurate and complete. 12/13/2020  I,[name],acting as a Education administrator for Nance Pear, NP.,have documented all relevant documentation on the behalf of Nance Pear, NP,as directed by  Nance Pear, NP while in the presence of Nance Pear, NP.  Nance Pear, NP

## 2020-12-13 NOTE — Assessment & Plan Note (Addendum)
BP Readings from Last 3 Encounters:  12/13/20 (!) 148/88  12/06/20 (!) 172/94  05/24/20 140/86   BP is improved on toprol xl 50mg .  Advised pt to continue current dose. Monitor bp at home several times/week and let me know if he is getting consistent sbp's >150.  Pt verbalizes understanding.

## 2020-12-23 DIAGNOSIS — M62830 Muscle spasm of back: Secondary | ICD-10-CM | POA: Diagnosis not present

## 2020-12-23 DIAGNOSIS — M9901 Segmental and somatic dysfunction of cervical region: Secondary | ICD-10-CM | POA: Diagnosis not present

## 2020-12-23 DIAGNOSIS — M5413 Radiculopathy, cervicothoracic region: Secondary | ICD-10-CM | POA: Diagnosis not present

## 2020-12-23 DIAGNOSIS — M5417 Radiculopathy, lumbosacral region: Secondary | ICD-10-CM | POA: Diagnosis not present

## 2020-12-23 DIAGNOSIS — M9903 Segmental and somatic dysfunction of lumbar region: Secondary | ICD-10-CM | POA: Diagnosis not present

## 2021-01-11 ENCOUNTER — Encounter: Payer: Self-pay | Admitting: Family

## 2021-01-16 ENCOUNTER — Other Ambulatory Visit: Payer: Self-pay | Admitting: Family

## 2021-01-18 DIAGNOSIS — R972 Elevated prostate specific antigen [PSA]: Secondary | ICD-10-CM | POA: Diagnosis not present

## 2021-01-18 DIAGNOSIS — K4020 Bilateral inguinal hernia, without obstruction or gangrene, not specified as recurrent: Secondary | ICD-10-CM | POA: Diagnosis not present

## 2021-02-21 DIAGNOSIS — Z79899 Other long term (current) drug therapy: Secondary | ICD-10-CM | POA: Diagnosis not present

## 2021-02-21 DIAGNOSIS — Z85820 Personal history of malignant melanoma of skin: Secondary | ICD-10-CM | POA: Diagnosis not present

## 2021-02-21 DIAGNOSIS — C4441 Basal cell carcinoma of skin of scalp and neck: Secondary | ICD-10-CM | POA: Diagnosis not present

## 2021-02-21 DIAGNOSIS — L2089 Other atopic dermatitis: Secondary | ICD-10-CM | POA: Diagnosis not present

## 2021-03-24 DIAGNOSIS — M5417 Radiculopathy, lumbosacral region: Secondary | ICD-10-CM | POA: Diagnosis not present

## 2021-03-24 DIAGNOSIS — M9901 Segmental and somatic dysfunction of cervical region: Secondary | ICD-10-CM | POA: Diagnosis not present

## 2021-03-24 DIAGNOSIS — M62830 Muscle spasm of back: Secondary | ICD-10-CM | POA: Diagnosis not present

## 2021-03-24 DIAGNOSIS — M5413 Radiculopathy, cervicothoracic region: Secondary | ICD-10-CM | POA: Diagnosis not present

## 2021-03-24 DIAGNOSIS — M9903 Segmental and somatic dysfunction of lumbar region: Secondary | ICD-10-CM | POA: Diagnosis not present

## 2021-03-30 DIAGNOSIS — C4441 Basal cell carcinoma of skin of scalp and neck: Secondary | ICD-10-CM | POA: Diagnosis not present

## 2021-04-07 DIAGNOSIS — M9901 Segmental and somatic dysfunction of cervical region: Secondary | ICD-10-CM | POA: Diagnosis not present

## 2021-04-07 DIAGNOSIS — M9903 Segmental and somatic dysfunction of lumbar region: Secondary | ICD-10-CM | POA: Diagnosis not present

## 2021-04-07 DIAGNOSIS — M62830 Muscle spasm of back: Secondary | ICD-10-CM | POA: Diagnosis not present

## 2021-04-07 DIAGNOSIS — M5413 Radiculopathy, cervicothoracic region: Secondary | ICD-10-CM | POA: Diagnosis not present

## 2021-04-07 DIAGNOSIS — M5417 Radiculopathy, lumbosacral region: Secondary | ICD-10-CM | POA: Diagnosis not present

## 2021-04-13 DIAGNOSIS — L905 Scar conditions and fibrosis of skin: Secondary | ICD-10-CM | POA: Diagnosis not present

## 2021-05-12 ENCOUNTER — Encounter: Payer: Self-pay | Admitting: Family

## 2021-05-23 DIAGNOSIS — Z1211 Encounter for screening for malignant neoplasm of colon: Secondary | ICD-10-CM | POA: Diagnosis not present

## 2021-05-23 LAB — COLOGUARD: Cologuard: NEGATIVE

## 2021-05-31 LAB — COLOGUARD: COLOGUARD: NEGATIVE

## 2021-06-03 ENCOUNTER — Other Ambulatory Visit: Payer: Self-pay | Admitting: Family

## 2021-06-07 DIAGNOSIS — M9901 Segmental and somatic dysfunction of cervical region: Secondary | ICD-10-CM | POA: Diagnosis not present

## 2021-06-07 DIAGNOSIS — M5417 Radiculopathy, lumbosacral region: Secondary | ICD-10-CM | POA: Diagnosis not present

## 2021-06-07 DIAGNOSIS — M9903 Segmental and somatic dysfunction of lumbar region: Secondary | ICD-10-CM | POA: Diagnosis not present

## 2021-06-07 DIAGNOSIS — M5413 Radiculopathy, cervicothoracic region: Secondary | ICD-10-CM | POA: Diagnosis not present

## 2021-06-07 DIAGNOSIS — M62830 Muscle spasm of back: Secondary | ICD-10-CM | POA: Diagnosis not present

## 2021-06-08 DIAGNOSIS — L2089 Other atopic dermatitis: Secondary | ICD-10-CM | POA: Diagnosis not present

## 2021-06-08 DIAGNOSIS — Z85820 Personal history of malignant melanoma of skin: Secondary | ICD-10-CM | POA: Diagnosis not present

## 2021-06-08 DIAGNOSIS — Z79899 Other long term (current) drug therapy: Secondary | ICD-10-CM | POA: Diagnosis not present

## 2021-06-08 DIAGNOSIS — D485 Neoplasm of uncertain behavior of skin: Secondary | ICD-10-CM | POA: Diagnosis not present

## 2021-06-15 DIAGNOSIS — C44529 Squamous cell carcinoma of skin of other part of trunk: Secondary | ICD-10-CM | POA: Diagnosis not present

## 2021-06-15 DIAGNOSIS — C44612 Basal cell carcinoma of skin of right upper limb, including shoulder: Secondary | ICD-10-CM | POA: Diagnosis not present

## 2021-06-20 ENCOUNTER — Ambulatory Visit (INDEPENDENT_AMBULATORY_CARE_PROVIDER_SITE_OTHER): Payer: Medicare HMO | Admitting: Family

## 2021-06-20 VITALS — BP 149/95 | HR 100 | Temp 98.4°F | Resp 16 | Wt 176.0 lb

## 2021-06-20 DIAGNOSIS — I10 Essential (primary) hypertension: Secondary | ICD-10-CM | POA: Diagnosis not present

## 2021-06-20 DIAGNOSIS — R972 Elevated prostate specific antigen [PSA]: Secondary | ICD-10-CM

## 2021-06-20 LAB — BASIC METABOLIC PANEL
BUN: 11 mg/dL (ref 6–23)
CO2: 26 mEq/L (ref 19–32)
Calcium: 9.5 mg/dL (ref 8.4–10.5)
Chloride: 102 mEq/L (ref 96–112)
Creatinine, Ser: 0.77 mg/dL (ref 0.40–1.50)
GFR: 91.84 mL/min (ref >60.00)
Glucose, Bld: 112 mg/dL — ABNORMAL HIGH (ref 70–99)
Potassium: 4.3 mEq/L (ref 3.5–5.1)
Sodium: 137 mEq/L (ref 135–145)

## 2021-06-20 MED ORDER — METOPROLOL SUCCINATE ER 100 MG PO TB24: 100.0000 mg | ORAL_TABLET | Freq: Every day | ORAL | 1 refills | Status: DC

## 2021-06-20 NOTE — Progress Notes (Addendum)
? ?Subjective:  ? ?By signing my name below, I, Jose Strong, attest that this documentation has been prepared under the direction and in the presence of Jose Strong, 06/20/2021   ? ? Patient ID: Jose Strong, male    DOB: [birth year], 71 y.o.   MRN: [id number] ? ?Chief Complaint  ?Patient presents with  ? Hypertension  ?  Here for follow up  ? Hyperlipidemia  ?  Here for follow up  ? ? ?HPI ?Patient is in today for an office visit. ? ?Blood Pressure - His blood pressure has not been improving. He was taking 50 MG of Metoprolol Succinate but saw no improvements to his blood pressure so he discontinued taking the medication on 05/27/2021. He stated that after discontinuing use, he felt better. His diet is well. He is also regularly exercising. He denies any known immediate family members having high blood pressure, but he does not know his biological father so that side is unknown.  ?BP Readings from Last 3 Encounters:  ?06/20/21 (!) 149/95  ?12/13/20 (!) 148/88  ?12/06/20 (!) 172/94  ? ?Pulse Readings from Last 3 Encounters:  ?06/20/21 100  ?12/13/20 66  ?12/06/20 66  ? ?PSA - He saw Dr. Milford Cage for his elevated PSA on 01/18/2021. He stated that he has been juicing frequently. Plan for now per pt is sureviellance.  ? ? ?Health Maintenance Due  ?Topic Date Due  ? Zoster Vaccines- Shingrix (1 of 2) Never done  ? COVID-19 Vaccine (4 - Booster for [place] series) 04/05/2020  ? COLON CANCER SCREENING ANNUAL FOBT  01/14/2021  ? ? ?Past Medical History:  ?Diagnosis Date  ? Cancer [facility]) 01-Jun-2009  ? melanoma --neck  ? Essential hypertension 05/20/2018  ? Malignant melanoma ([place]) 05/19/2020  ? stage 1 A, removed by Dr. Orbie Hurst and refered to [facility] for wide excision  ? Melanoma ([place]) 06-01-20  ? ? ?Past Surgical History:  ?Procedure Laterality Date  ? MOHS SURGERY  03/12/2009  ? MOHS SURGERY Right 07/2020  ? non-invasive melanoma  ? TONSILLECTOMY  03/12/1962  ? TONSILLECTOMY    ? ? ?Family History  ?Problem Relation  Age of Onset  ? COPD Mother   ? Angina Mother   ? ? ?Social History  ? ?Socioeconomic History  ? Marital status: Widowed  ?  Spouse name: Not on file  ? Number of children: 0  ? Years of education: Not on file  ? Highest education level: Not on file  ?Occupational History  ? Not on file  ?Tobacco Use  ? Smoking status: Former  ? Smokeless tobacco: Never  ?Substance and Sexual Activity  ? Alcohol use: Yes  ?  Comment: 2-4 beers per day  ? Drug use: Not on file  ? Sexual activity: Not on file  ?Other Topics Concern  ? Not on file  ?Social History Narrative  ? Wife died Jun 02, 2014, had cancer  ? Unemployed   ? Completed HS  ? Not working  ? 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    ? He does not have children  ? Enjoys fishing/golf, recently caring for his wife.    ? ?Social Determinants of Health  ? ?Financial Resource Strain: Low Risk   ? Difficulty of Paying Living Expenses: Not hard at all  ?Food Insecurity: No Food Insecurity  ? Worried About Charity fundraiser in the Last Year: Never true  ? Ran Out of Food in the Last Year: Never true  ?Transportation Needs: No Transportation  Needs  ? Lack of Transportation (Medical): No  ? Lack of Transportation (Non-Medical): No  ?Physical Activity: Sufficiently Active  ? Days of Exercise per Week: 7 days  ? Minutes of Exercise per Session: 30 min  ?Stress: No Stress Concern Present  ? Feeling of Stress : Not at all  ?Social Connections: Moderately Isolated  ? Frequency of Communication with Friends and Family: More than three times a week  ? Frequency of Social Gatherings with Friends and Family: More than three times a week  ? Attends Religious Services: More than 4 times per year  ? Active Member of Clubs or Organizations: No  ? Attends Archivist Meetings: Never  ? Marital Status: Widowed  ?Intimate Partner Violence: Not At Risk  ? Fear of Current or Ex-Partner: No  ? Emotionally Abused: No  ? Physically Abused: No  ? Sexually Abused: No  ? ? ?Outpatient Medications Prior  to Visit  ?Medication Sig Dispense Refill  ? Calcium-Magnesium-Zinc 167-83-8 MG TABS Take 1 tablet by mouth daily.    ? clotrimazole-betamethasone (LOTRISONE) cream Apply 1 application topically 2 (two) times daily. 45 g 1  ? dupilumab (DUPIXENT) 200 MG/1.14ML prefilled syringe Inject 200 mg into the skin once.    ? Multiple Vitamin (MULTIVITAMIN) tablet Take 1 tablet by mouth daily.    ? Omega-3 Fatty Acids (FISH OIL) 1000 MG CPDR 3000 mg by mouth twice daily.    ? metoprolol succinate (TOPROL-XL) 50 MG 24 hr tablet TAKE 1 TABLET BY MOUTH DAILY. TAKE WITH OR IMMEDIATELY FOLLOWING A MEAL. 90 tablet 0  ? ?No facility-administered medications prior to visit.  ? ? ?Allergies  ?Allergen Reactions  ? Amlodipine   ?  rash  ? ? ?ROS ?See HPI ?   ?Objective:  ?  ?Physical Exam ?Constitutional:   ?   General: He is not in acute distress. ?   Appearance: Normal appearance. He is not ill-appearing.  ?HENT:  ?   Head: Normocephalic and atraumatic.  ?   Right Ear: External ear normal.  ?   Left Ear: External ear normal.  ?Eyes:  ?   Extraocular Movements: Extraocular movements intact.  ?   Pupils: Pupils are equal, round, and reactive to light.  ?Cardiovascular:  ?   Rate and Rhythm: Normal rate and regular rhythm.  ?   Heart sounds: Normal heart sounds. No murmur heard. ?  No gallop.  ?Pulmonary:  ?   Effort: Pulmonary effort is normal. No respiratory distress.  ?   Breath sounds: Normal breath sounds. No wheezing or rales.  ?Skin: ?   General: Skin is warm and dry.  ?Neurological:  ?   Mental Status: He is alert and oriented to person, place, and time.  ?Psychiatric:     ?   Mood and Affect: Mood normal.     ?   Behavior: Behavior normal.     ?   Judgment: Judgment normal.  ? ? ?BP (!) 149/95 (BP Location: Right Arm, Patient Position: Sitting, Cuff Size: Small)   Pulse 100   Temp 98.4 ?F (36.9 ?C) (Oral)   Resp 16   Wt 176 lb (79.8 kg)   SpO2 99%   BMI 25.25 kg/m?  ?Wt Readings from Last 3 Encounters:  ?06/20/21 176 lb  (79.8 kg)  ?12/13/20 168 lb 6.4 oz (76.4 kg)  ?12/06/20 171 lb (77.6 kg)  ? ? ?   ?Assessment & Plan:  ? ?Problem List Items Addressed This Visit   ? ?  ?  Unprioritized  ? Essential hypertension - Primary  ?  BP Readings from Last 3 Encounters:  ?06/20/21 (!) 149/95  ?12/13/20 (!) 148/88  ?12/06/20 (!) 172/94  ?BP remains above goal. I advised the patient that I do not think that the Toprol xl '50mg'$  was strong enough for him. He does not wish to try a different type of medication at this time. Will increase toprol xl to '100mg'$  once daily. Continue healthy diet and regular exercise.  ?  ?  ? Relevant Medications  ? metoprolol succinate (TOPROL-XL) 100 MG 24 hr tablet  ? Other Relevant Orders  ? Basic metabolic panel  ? Elevated PSA  ?  Surveillance per Urology.  ?  ?  ? ? ? ? ? ? ?Meds ordered this encounter  ?Medications  ? metoprolol succinate (TOPROL-XL) 100 MG 24 hr tablet  ?  Sig: Take 1 tablet (100 mg total) by mouth daily. Take with or immediately following a meal.  ?  Dispense:  90 tablet  ?  Refill:  1  ?  Order Specific Question:   Supervising Provider  ?  Answer:   Penni Homans A [[id number]]  ? ? ?I, Nance Pear, Strong, personally preformed the services described in this documentation.  All medical record entries made by the scribe were at my direction and in my presence.  I have reviewed the chart and discharge instructions (if applicable) and agree that the record reflects my personal performance and is accurate and complete. 06/20/2021 ? ? ?I,Amber Collins,acting as a Education administrator for [employer], Strong.,have documented all relevant documentation on the behalf of Nance Pear, Strong,as directed by  Nance Pear, Strong while in the presence of Nance Pear, Strong. ? ? ? ?Nance Pear, Strong ? ?

## 2021-06-20 NOTE — Assessment & Plan Note (Signed)
Surveillance per Urology.  ?

## 2021-06-20 NOTE — Patient Instructions (Signed)
Please complete lab work prior to leaving.  ?Restart toprol xl '100mg'$  once daily.  ?

## 2021-06-20 NOTE — Assessment & Plan Note (Signed)
BP Readings from Last 3 Encounters:  ?06/20/21 (!) 149/95  ?12/13/20 (!) 148/88  ?12/06/20 (!) 172/94  ? ?BP remains above goal. I advised the patient that I do not think that the Toprol xl '50mg'$  was strong enough for him. He does not wish to try a different type of medication at this time. Will increase toprol xl to '100mg'$  once daily. Continue healthy diet and regular exercise.  ?

## 2021-07-04 ENCOUNTER — Ambulatory Visit (INDEPENDENT_AMBULATORY_CARE_PROVIDER_SITE_OTHER): Payer: Medicare HMO | Admitting: Family

## 2021-07-04 DIAGNOSIS — I10 Essential (primary) hypertension: Secondary | ICD-10-CM | POA: Diagnosis not present

## 2021-07-04 MED ORDER — CARVEDILOL 12.5 MG PO TABS: 12.5000 mg | ORAL_TABLET | Freq: Two times a day (BID) | ORAL | 3 refills | Status: DC

## 2021-07-04 NOTE — Progress Notes (Signed)
? ?Subjective:  ? ?By signing my name below, I, [name], attest that this documentation has been prepared under the direction and in the presence of [name] NP, 07/04/2021  ? ? Patient ID: Garry Nicolini, male    DOB: 02-Jun-1950, 71 y.o.   MRN: [id number] ? ?Chief Complaint  ?Patient presents with  ? Hypertension  ?  Here for follow up  ? ? ?HPI ?Patient is in today for an office visit. ? ?Blood Pressure - As of today's visit, his blood pressure is increasing. He reports that his blood pressure at home was 138/98. He is taking 100 MG of Metoprolol Succinate. He wants to know if his machine is accurate with his readings.  ?BP Readings from Last 3 Encounters:  ?07/04/21 (!) 154/77  ?06/20/21 (!) 149/95  ?12/13/20 (!) 148/88  ? ?Pulse Readings from Last 3 Encounters:  ?07/04/21 69  ?06/20/21 100  ?12/13/20 66  ? ? ?Health Maintenance Due  ?Topic Date Due  ? Zoster Vaccines- Shingrix (1 of 2) Never done  ? COVID-19 Vaccine (4 - Booster for [place] series) 04/05/2020  ? COLON CANCER SCREENING ANNUAL FOBT  01/14/2021  ? ? ?Past Medical History:  ?Diagnosis Date  ? Cancer [facility]) 01-Jun-2009  ? melanoma --neck  ? Essential hypertension 05/20/2018  ? Malignant melanoma ([place]) 05/19/2020  ? stage 1 A, removed by Dr. Orbie Hurst and refered to [facility] for wide excision  ? Melanoma ([place]) 06/01/20  ? ? ?Past Surgical History:  ?Procedure Laterality Date  ? MOHS SURGERY  03/12/2009  ? MOHS SURGERY Right 07/2020  ? non-invasive melanoma  ? TONSILLECTOMY  03/12/1962  ? TONSILLECTOMY    ? ? ?Family History  ?Problem Relation Age of Onset  ? COPD Mother   ? Angina Mother   ? ? ?Social History  ? ?Socioeconomic History  ? Marital status: Widowed  ?  Spouse name: Not on file  ? Number of children: 0  ? Years of education: Not on file  ? Highest education level: Not on file  ?Occupational History  ? Not on file  ?Tobacco Use  ? Smoking status: Former  ? Smokeless tobacco: Never  ?Substance and Sexual Activity  ? Alcohol use: Yes  ?   Comment: 2-4 beers per day  ? Drug use: Not on file  ? Sexual activity: Not on file  ?Other Topics Concern  ? Not on file  ?Social History Narrative  ? Wife died 06/02/2014, had cancer  ? Unemployed   ? Completed HS  ? Not working  ? 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    ? He does not have children  ? Enjoys fishing/golf, recently caring for his wife.    ? ?Social Determinants of Health  ? ?Financial Resource Strain: Low Risk   ? Difficulty of Paying Living Expenses: Not hard at all  ?Food Insecurity: No Food Insecurity  ? Worried About Charity fundraiser in the Last Year: Never true  ? Ran Out of Food in the Last Year: Never true  ?Transportation Needs: No Transportation Needs  ? Lack of Transportation (Medical): No  ? Lack of Transportation (Non-Medical): No  ?Physical Activity: Sufficiently Active  ? Days of Exercise per Week: 7 days  ? Minutes of Exercise per Session: 30 min  ?Stress: No Stress Concern Present  ? Feeling of Stress : Not at all  ?Social Connections: Moderately Isolated  ? Frequency of Communication with Friends and Family: More than three times a week  ?  Frequency of Social Gatherings with Friends and Family: More than three times a week  ? Attends Religious Services: More than 4 times per year  ? Active Member of Clubs or Organizations: No  ? Attends Archivist Meetings: Never  ? Marital Status: Widowed  ?Intimate Partner Violence: Not At Risk  ? Fear of Current or Ex-Partner: No  ? Emotionally Abused: No  ? Physically Abused: No  ? Sexually Abused: No  ? ? ?Outpatient Medications Prior to Visit  ?Medication Sig Dispense Refill  ? Calcium-Magnesium-Zinc 167-83-8 MG TABS Take 1 tablet by mouth daily.    ? clotrimazole-betamethasone (LOTRISONE) cream Apply 1 application topically 2 (two) times daily. 45 g 1  ? dupilumab (DUPIXENT) 200 MG/1.14ML prefilled syringe Inject 200 mg into the skin once.    ? Multiple Vitamin (MULTIVITAMIN) tablet Take 1 tablet by mouth daily.    ? Omega-3 Fatty  Acids (FISH OIL) 1000 MG CPDR 3000 mg by mouth twice daily.    ? metoprolol succinate (TOPROL-XL) 100 MG 24 hr tablet Take 1 tablet (100 mg total) by mouth daily. Take with or immediately following a meal. 90 tablet 1  ? ?No facility-administered medications prior to visit.  ? ? ?Allergies  ?Allergen Reactions  ? Amlodipine   ?  rash  ? ? ?ROS ? ?   ?Objective:  ?  ?Physical Exam ?Constitutional:   ?   General: He is not in acute distress. ?   Appearance: Normal appearance. He is not ill-appearing.  ?HENT:  ?   Head: Normocephalic and atraumatic.  ?   Right Ear: External ear normal.  ?   Left Ear: External ear normal.  ?Eyes:  ?   Extraocular Movements: Extraocular movements intact.  ?   Pupils: Pupils are equal, round, and reactive to light.  ?Cardiovascular:  ?   Rate and Rhythm: Normal rate and regular rhythm.  ?   Heart sounds: Normal heart sounds. No murmur heard. ?  No gallop.  ?Pulmonary:  ?   Effort: Pulmonary effort is normal. No respiratory distress.  ?   Breath sounds: Normal breath sounds. No wheezing or rales.  ?Skin: ?   General: Skin is warm and dry.  ?Neurological:  ?   Mental Status: He is alert and oriented to person, place, and time.  ?Psychiatric:     ?   Mood and Affect: Mood normal.     ?   Behavior: Behavior normal.     ?   Judgment: Judgment normal.  ? ? ?BP (!) 154/77 (BP Location: Right Arm, Patient Position: Sitting, Cuff Size: Small)   Pulse 69   Temp 98.3 ?F (36.8 ?C) (Oral)   Resp 16   Wt 176 lb (79.8 kg)   SpO2 100%   BMI 25.25 kg/m?  ?Wt Readings from Last 3 Encounters:  ?07/04/21 176 lb (79.8 kg)  ?06/20/21 176 lb (79.8 kg)  ?12/13/20 168 lb 6.4 oz (76.4 kg)  ? ? ?   ?Assessment & Plan:  ? ?Problem List Items Addressed This Visit   ? ?  ? Unprioritized  ? Essential hypertension  ?  BP is still above goal. Recommended that we d/c metoprolol and instead begin carvedilol 12.'5mg'$  bid. He would like to bring his bp machine next visit to compare. ? ?  ?  ? Relevant Medications  ?  carvedilol ([facility]) 12.5 MG tablet  ? ? ? ? ?Meds ordered this encounter  ?Medications  ? carvedilol ([facility]) 12.5 MG tablet  ?  Sig: Take 1 tablet (12.5 mg total) by mouth 2 (two) times daily with a meal.  ?  Dispense:  60 tablet  ?  Refill:  3  ?  Order Specific Question:   Supervising Provider  ?  Answer:   Penni Homans A [[id number]]  ? ? ?I, Nance Pear, NP, personally preformed the services described in this documentation.  All medical record entries made by the scribe were at my direction and in my presence.  I have reviewed the chart and discharge instructions (if applicable) and agree that the record reflects my personal performance and is accurate and complete. 07/04/2021 ? ? ?I,Amber Collins,acting as a Education administrator for [employer], NP.,have documented all relevant documentation on the behalf of Nance Pear, NP,as directed by  Nance Pear, NP while in the presence of Nance Pear, NP. ? ? ? ?Nance Pear, NP ? ?

## 2021-07-04 NOTE — Assessment & Plan Note (Signed)
BP is still above goal. Recommended that we d/c metoprolol and instead begin carvedilol 12.'5mg'$  bid. He would like to bring his bp machine next visit to compare. ?

## 2021-07-11 ENCOUNTER — Ambulatory Visit (INDEPENDENT_AMBULATORY_CARE_PROVIDER_SITE_OTHER): Payer: Medicare HMO | Admitting: Family

## 2021-07-11 ENCOUNTER — Encounter: Payer: Self-pay | Admitting: Family

## 2021-07-11 DIAGNOSIS — N529 Male erectile dysfunction, unspecified: Secondary | ICD-10-CM | POA: Diagnosis not present

## 2021-07-11 DIAGNOSIS — I10 Essential (primary) hypertension: Secondary | ICD-10-CM | POA: Diagnosis not present

## 2021-07-11 DIAGNOSIS — R972 Elevated prostate specific antigen [PSA]: Secondary | ICD-10-CM | POA: Diagnosis not present

## 2021-07-11 MED ORDER — TADALAFIL 10 MG PO TABS: 10.0000 mg | ORAL_TABLET | Freq: Every day | ORAL | 1 refills | Status: DC | PRN

## 2021-07-11 NOTE — Assessment & Plan Note (Signed)
Uncontrolled. Would like trial of Cialis. Rx sent for '10mg'$  tabs prn.  ?

## 2021-07-11 NOTE — Assessment & Plan Note (Signed)
BP much better today. He is also feeling better on carvedilol.   ?BP Readings from Last 3 Encounters:  ?07/11/21 125/88  ?07/04/21 (!) 154/77  ?06/20/21 (!) 149/95  ? ?Continue carvedilol,  ?

## 2021-07-11 NOTE — Assessment & Plan Note (Signed)
Continues to follow with Urology

## 2021-07-11 NOTE — Progress Notes (Signed)
? ?Subjective:  ? ?By signing my name below, I, [name], attest that this documentation has been prepared under the direction and in the presence of [name] NP, 07/11/2021   ? ? Patient ID: Jose Strong, male    DOB: 1950/05/18, 71 y.o.   MRN: [id number] ? ?Chief Complaint  ?Patient presents with  ? Hypertension  ?  Here for follow up  ? ? ?HPI ?Patient is in today for an office visit. ? ?Cialis - He is requesting to begin Cialis. He is not having any urinary issues at the moment.  ? ?Blood Pressure - As of today's visit, his blood pressure is good. When he took his reading with his machine this morning, it read 147/90. He has been feeling better since taking 12.5 MG of Coreg.  ?BP Readings from Last 3 Encounters:  ?07/11/21 125/88  ?07/04/21 (!) 154/77  ?06/20/21 (!) 149/95  ? ?Pulse Readings from Last 3 Encounters:  ?07/11/21 79  ?07/04/21 69  ?06/20/21 100  ? ?Health Maintenance Due  ?Topic Date Due  ? Zoster Vaccines- Shingrix (1 of 2) Never done  ? COVID-19 Vaccine (4 - Booster for [place] series) 04/05/2020  ? COLON CANCER SCREENING ANNUAL FOBT  01/14/2021  ? ? ?Past Medical History:  ?Diagnosis Date  ? Cancer [facility]) 2009/06/08  ? melanoma --neck  ? Essential hypertension 05/20/2018  ? Malignant melanoma ([place]) 05/19/2020  ? stage 1 A, removed by Dr. Orbie Hurst and refered to [facility] for wide excision  ? Melanoma ([place]) Jun 08, 2020  ? ? ?Past Surgical History:  ?Procedure Laterality Date  ? MOHS SURGERY  03/12/2009  ? MOHS SURGERY Right 07/2020  ? non-invasive melanoma  ? TONSILLECTOMY  03/12/1962  ? TONSILLECTOMY    ? ? ?Family History  ?Problem Relation Age of Onset  ? COPD Mother   ? Angina Mother   ? ? ?Social History  ? ?Socioeconomic History  ? Marital status: Widowed  ?  Spouse name: Not on file  ? Number of children: 0  ? Years of education: Not on file  ? Highest education level: Not on file  ?Occupational History  ? Not on file  ?Tobacco Use  ? Smoking status: Former  ? Smokeless tobacco: Never   ?Substance and Sexual Activity  ? Alcohol use: Yes  ?  Comment: 2-4 beers per day  ? Drug use: Not on file  ? Sexual activity: Not on file  ?Other Topics Concern  ? Not on file  ?Social History Narrative  ? Wife died Jun 09, 2014, had cancer  ? Unemployed   ? Completed HS  ? Not working  ? 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    ? He does not have children  ? Enjoys fishing/golf, recently caring for his wife.    ? ?Social Determinants of Health  ? ?Financial Resource Strain: Low Risk   ? Difficulty of Paying Living Expenses: Not hard at all  ?Food Insecurity: No Food Insecurity  ? Worried About Charity fundraiser in the Last Year: Never true  ? Ran Out of Food in the Last Year: Never true  ?Transportation Needs: No Transportation Needs  ? Lack of Transportation (Medical): No  ? Lack of Transportation (Non-Medical): No  ?Physical Activity: Sufficiently Active  ? Days of Exercise per Week: 7 days  ? Minutes of Exercise per Session: 30 min  ?Stress: No Stress Concern Present  ? Feeling of Stress : Not at all  ?Social Connections: Moderately Isolated  ? Frequency  of Communication with Friends and Family: More than three times a week  ? Frequency of Social Gatherings with Friends and Family: More than three times a week  ? Attends Religious Services: More than 4 times per year  ? Active Member of Clubs or Organizations: No  ? Attends Archivist Meetings: Never  ? Marital Status: Widowed  ?Intimate Partner Violence: Not At Risk  ? Fear of Current or Ex-Partner: No  ? Emotionally Abused: No  ? Physically Abused: No  ? Sexually Abused: No  ? ? ?Outpatient Medications Prior to Visit  ?Medication Sig Dispense Refill  ? Calcium-Magnesium-Zinc 167-83-8 MG TABS Take 1 tablet by mouth daily.    ? carvedilol (COREG) 12.5 MG tablet Take 1 tablet (12.5 mg total) by mouth 2 (two) times daily with a meal. 60 tablet 3  ? clotrimazole-betamethasone (LOTRISONE) cream Apply 1 application topically 2 (two) times daily. 45 g 1  ?  dupilumab (DUPIXENT) 200 MG/1.14ML prefilled syringe Inject 200 mg into the skin once.    ? Multiple Vitamin (MULTIVITAMIN) tablet Take 1 tablet by mouth daily.    ? Omega-3 Fatty Acids (FISH OIL) 1000 MG CPDR 3000 mg by mouth twice daily.    ? ?No facility-administered medications prior to visit.  ? ? ?Allergies  ?Allergen Reactions  ? Amlodipine   ?  rash  ? ? ?ROS ?See HPI ?   ?Objective:  ?  ?Physical Exam ?Constitutional:   ?   General: He is not in acute distress. ?   Appearance: Normal appearance. He is not ill-appearing.  ?HENT:  ?   Head: Normocephalic and atraumatic.  ?   Right Ear: External ear normal.  ?   Left Ear: External ear normal.  ?Eyes:  ?   Extraocular Movements: Extraocular movements intact.  ?   Pupils: Pupils are equal, round, and reactive to light.  ?Cardiovascular:  ?   Rate and Rhythm: Normal rate and regular rhythm.  ?   Heart sounds: Normal heart sounds. No murmur heard. ?Pulmonary:  ?   Effort: Pulmonary effort is normal. No respiratory distress.  ?   Breath sounds: Normal breath sounds. No wheezing or rales.  ?Skin: ?   General: Skin is warm and dry.  ?Neurological:  ?   Mental Status: He is alert and oriented to person, place, and time.  ?Psychiatric:     ?   Mood and Affect: Mood normal.     ?   Behavior: Behavior normal.     ?   Judgment: Judgment normal.  ? ? ?BP 125/88 (BP Location: Right Arm, Patient Position: Sitting, Cuff Size: Small)   Pulse 79   Temp 98.6 ?F (37 ?C) (Oral)   Resp 16   Wt 178 lb (80.7 kg)   SpO2 98%   BMI 25.54 kg/m?  ?Wt Readings from Last 3 Encounters:  ?07/11/21 178 lb (80.7 kg)  ?07/04/21 176 lb (79.8 kg)  ?06/20/21 176 lb (79.8 kg)  ? ? ?   ?Assessment & Plan:  ? ?Problem List Items Addressed This Visit   ? ?  ? Unprioritized  ? Essential hypertension  ?  BP much better today. He is also feeling better on carvedilol.   ?BP Readings from Last 3 Encounters:  ?07/11/21 125/88  ?07/04/21 (!) 154/77  ?06/20/21 (!) 149/95  ?Continue carvedilol,  ?  ?  ?  Relevant Medications  ? tadalafil (CIALIS) 10 MG tablet  ? Erectile dysfunction  ?  Uncontrolled. Would like trial  of Cialis. Rx sent for '10mg'$  tabs prn.  ? ?  ?  ? Elevated PSA  ?  Continues to follow with Urology.  ? ?  ?  ? ? ? ? ?Meds ordered this encounter  ?Medications  ? tadalafil (CIALIS) 10 MG tablet  ?  Sig: Take 1 tablet (10 mg total) by mouth daily as needed for erectile dysfunction.  ?  Dispense:  10 tablet  ?  Refill:  1  ?  Order Specific Question:   Supervising Provider  ?  Answer:   Penni Homans A [[id number]]  ? ? ?I, Nance Pear, NP, personally preformed the services described in this documentation.  All medical record entries made by the scribe were at my direction and in my presence.  I have reviewed the chart and discharge instructions (if applicable) and agree that the record reflects my personal performance and is accurate and complete. 07/11/2021 ? ? ?I,Amber Collins,acting as a Education administrator for [employer], NP.,have documented all relevant documentation on the behalf of Nance Pear, NP,as directed by  Nance Pear, NP while in the presence of Nance Pear, NP. ? ? ?Nance Pear, NP ? ?

## 2021-07-12 ENCOUNTER — Other Ambulatory Visit: Payer: Self-pay

## 2021-07-12 DIAGNOSIS — R972 Elevated prostate specific antigen [PSA]: Secondary | ICD-10-CM | POA: Diagnosis not present

## 2021-07-12 MED ORDER — TADALAFIL 10 MG PO TABS: 10.0000 mg | ORAL_TABLET | Freq: Every day | ORAL | 1 refills | Status: DC | PRN

## 2021-07-13 ENCOUNTER — Encounter: Payer: Self-pay | Admitting: Family

## 2021-07-13 MED ORDER — TADALAFIL 5 MG PO TABS
10.0000 mg | ORAL_TABLET | Freq: Every day | ORAL | 5 refills | Status: DC | PRN
Start: 2021-07-13 — End: 2021-10-11

## 2021-07-14 ENCOUNTER — Other Ambulatory Visit: Payer: Self-pay

## 2021-07-14 DIAGNOSIS — K4090 Unilateral inguinal hernia, without obstruction or gangrene, not specified as recurrent: Secondary | ICD-10-CM

## 2021-07-19 DIAGNOSIS — R972 Elevated prostate specific antigen [PSA]: Secondary | ICD-10-CM | POA: Diagnosis not present

## 2021-07-19 DIAGNOSIS — K4020 Bilateral inguinal hernia, without obstruction or gangrene, not specified as recurrent: Secondary | ICD-10-CM | POA: Diagnosis not present

## 2021-07-25 ENCOUNTER — Telehealth: Payer: Self-pay | Admitting: Family

## 2021-07-25 NOTE — Telephone Encounter (Signed)
Copied from [place] [id number]. Topic: Medicare AWV ?>> Jul 25, 2021  1:36 PM Harris-Coley, Hannah Beat wrote: ?Reason for CRM: Left message for patient to schedule Annual Wellness Visit.  Please schedule (telephone/video call) with Nurse Health Advisor Charlott Rakes, RN at [facility]. Please call [phone] ask for Juliann Pulse ?

## 2021-07-26 DIAGNOSIS — M9901 Segmental and somatic dysfunction of cervical region: Secondary | ICD-10-CM | POA: Diagnosis not present

## 2021-07-26 DIAGNOSIS — M9903 Segmental and somatic dysfunction of lumbar region: Secondary | ICD-10-CM | POA: Diagnosis not present

## 2021-07-26 DIAGNOSIS — M62830 Muscle spasm of back: Secondary | ICD-10-CM | POA: Diagnosis not present

## 2021-07-26 DIAGNOSIS — M5413 Radiculopathy, cervicothoracic region: Secondary | ICD-10-CM | POA: Diagnosis not present

## 2021-07-26 DIAGNOSIS — M5417 Radiculopathy, lumbosacral region: Secondary | ICD-10-CM | POA: Diagnosis not present

## 2021-07-27 ENCOUNTER — Encounter: Payer: Self-pay | Admitting: Family

## 2021-08-01 ENCOUNTER — Ambulatory Visit: Payer: Medicare HMO

## 2021-08-02 ENCOUNTER — Ambulatory Visit (INDEPENDENT_AMBULATORY_CARE_PROVIDER_SITE_OTHER): Payer: Medicare HMO

## 2021-08-02 DIAGNOSIS — Z0000 Encounter for general adult medical examination without abnormal findings: Secondary | ICD-10-CM | POA: Diagnosis not present

## 2021-08-02 NOTE — Patient Instructions (Signed)
Jose Strong , Thank you for taking time to come for your Medicare Wellness Visit. I appreciate your ongoing commitment to your health goals. Please review the following plan we discussed and let me know if I can assist you in the future.   Screening recommendations/referrals: Colonoscopy: Done colo Recommended yearly ophthalmology/optometry visit for glaucoma screening and checkup Recommended yearly dental visit for hygiene and checkup  Vaccinations: Influenza vaccine: Done 12/06/20 repeat every year  Pneumococcal vaccine: Up to date Tdap vaccine: Done 02/10/16 repeat ever 10 years  Shingles vaccine: Shingrix discussed. Please contact your pharmacy for coverage information.    Covid-19: Completed 3/17, 4/7, 02/09/20  Advanced directives: Please bring a copy of your health care power of attorney and living will to the office at your convenience.  Conditions/risks identified: Continue walking and eating right   Next appointment: Follow up in one year for your annual wellness visit.   Preventive Care 49 Years and Older, Male Preventive care refers to lifestyle choices and visits with your health care provider that can promote health and wellness. What does preventive care include? A yearly physical exam. This is also called an annual well check. Dental exams once or twice a year. Routine eye exams. Ask your health care provider how often you should have your eyes checked. Personal lifestyle choices, including: Daily care of your teeth and gums. Regular physical activity. Eating a healthy diet. Avoiding tobacco and drug use. Limiting alcohol use. Practicing safe sex. Taking low doses of aspirin every day. Taking vitamin and mineral supplements as recommended by your health care provider. What happens during an annual well check? The services and screenings done by your health care provider during your annual well check will depend on your age, overall health, lifestyle risk factors, and  family history of disease. Counseling  Your health care provider may ask you questions about your: Alcohol use. Tobacco use. Drug use. Emotional well-being. Home and relationship well-being. Sexual activity. Eating habits. History of falls. Memory and ability to understand (cognition). Work and work Statistician. Screening  You may have the following tests or measurements: Height, weight, and BMI. Blood pressure. Lipid and cholesterol levels. These may be checked every 5 years, or more frequently if you are over 72 years old. Skin check. Lung cancer screening. You may have this screening every year starting at age 69 if you have a 30-pack-year history of smoking and currently smoke or have quit within the past 15 years. Fecal occult blood test (FOBT) of the stool. You may have this test every year starting at age 69. Flexible sigmoidoscopy or colonoscopy. You may have a sigmoidoscopy every 5 years or a colonoscopy every 10 years starting at age 69. Prostate cancer screening. Recommendations will vary depending on your family history and other risks. Hepatitis C blood test. Hepatitis B blood test. Sexually transmitted disease (STD) testing. Diabetes screening. This is done by checking your blood sugar (glucose) after you have not eaten for a while (fasting). You may have this done every 1-3 years. Abdominal aortic aneurysm (AAA) screening. You may need this if you are a current or former smoker. Osteoporosis. You may be screened starting at age 69 if you are at high risk. Talk with your health care provider about your test results, treatment options, and if necessary, the need for more tests. Vaccines  Your health care provider may recommend certain vaccines, such as: Influenza vaccine. This is recommended every year. Tetanus, diphtheria, and acellular pertussis (Tdap, Td) vaccine. You may need  a Td booster every 10 years. Zoster vaccine. You may need this after age 25. Pneumococcal  13-valent conjugate (PCV13) vaccine. One dose is recommended after age 40. Pneumococcal polysaccharide (PPSV23) vaccine. One dose is recommended after age 69. Talk to your health care provider about which screenings and vaccines you need and how often you need them. This information is not intended to replace advice given to you by your health care provider. Make sure you discuss any questions you have with your health care provider. Document Released: 03/25/2015 Document Revised: 11/16/2015 Document Reviewed: 12/28/2014 Elsevier Interactive Patient Education  2017 [place] Prevention in the Home Falls can cause injuries. They can happen to people of all ages. There are many things you can do to make your home safe and to help prevent falls. What can I do on the outside of my home? Regularly fix the edges of walkways and driveways and fix any cracks. Remove anything that might make you trip as you walk through a door, such as a raised step or threshold. Trim any bushes or trees on the path to your home. Use bright outdoor lighting. Clear any walking paths of anything that might make someone trip, such as rocks or tools. Regularly check to see if handrails are loose or broken. Make sure that both sides of any steps have handrails. Any raised decks and porches should have guardrails on the edges. Have any leaves, snow, or ice cleared regularly. Use sand or salt on walking paths during winter. Clean up any spills in your garage right away. This includes oil or grease spills. What can I do in the bathroom? Use night lights. Install grab bars by the toilet and in the tub and shower. Do not use towel bars as grab bars. Use non-skid mats or decals in the tub or shower. If you need to sit down in the shower, use a plastic, non-slip stool. Keep the floor dry. Clean up any water that spills on the floor as soon as it happens. Remove soap buildup in the tub or shower regularly. Attach  bath mats securely with double-sided non-slip rug tape. Do not have throw rugs and other things on the floor that can make you trip. What can I do in the bedroom? Use night lights. Make sure that you have a light by your bed that is easy to reach. Do not use any sheets or blankets that are too big for your bed. They should not hang down onto the floor. Have a firm chair that has side arms. You can use this for support while you get dressed. Do not have throw rugs and other things on the floor that can make you trip. What can I do in the kitchen? Clean up any spills right away. Avoid walking on wet floors. Keep items that you use a lot in easy-to-reach places. If you need to reach something above you, use a strong step stool that has a grab bar. Keep electrical cords out of the way. Do not use floor polish or wax that makes floors slippery. If you must use wax, use non-skid floor wax. Do not have throw rugs and other things on the floor that can make you trip. What can I do with my stairs? Do not leave any items on the stairs. Make sure that there are handrails on both sides of the stairs and use them. Fix handrails that are broken or loose. Make sure that handrails are as long as the stairways. Check  any carpeting to make sure that it is firmly attached to the stairs. Fix any carpet that is loose or worn. Avoid having throw rugs at the top or bottom of the stairs. If you do have throw rugs, attach them to the floor with carpet tape. Make sure that you have a light switch at the top of the stairs and the bottom of the stairs. If you do not have them, ask someone to add them for you. What else can I do to help prevent falls? Wear shoes that: Do not have high heels. Have rubber bottoms. Are comfortable and fit you well. Are closed at the toe. Do not wear sandals. If you use a stepladder: Make sure that it is fully opened. Do not climb a closed stepladder. Make sure that both sides of the  stepladder are locked into place. Ask someone to hold it for you, if possible. Clearly mark and make sure that you can see: Any grab bars or handrails. First and last steps. Where the edge of each step is. Use tools that help you move around (mobility aids) if they are needed. These include: Canes. Walkers. Scooters. Crutches. Turn on the lights when you go into a dark area. Replace any light bulbs as soon as they burn out. Set up your furniture so you have a clear path. Avoid moving your furniture around. If any of your floors are uneven, fix them. If there are any pets around you, be aware of where they are. Review your medicines with your doctor. Some medicines can make you feel dizzy. This can increase your chance of falling. Ask your doctor what other things that you can do to help prevent falls. This information is not intended to replace advice given to you by your health care provider. Make sure you discuss any questions you have with your health care provider. Document Released: 12/23/2008 Document Revised: 08/04/2015 Document Reviewed: 04/02/2014 Elsevier Interactive Patient Education  2017 [employer].

## 2021-08-02 NOTE — Progress Notes (Addendum)
Virtual Visit via Telephone Note  I connected with  Jose Strong on 08/02/21 at  9:45 AM EDT by telephone and verified that I am speaking with the correct person using two identifiers.  Medicare Annual Wellness visit completed telephonically due to Covid-19 pandemic.   Persons participating in this call: This Health Coach and this patient.   Location: Patient: Home Provider: Office    I discussed the limitations, risks, security and privacy concerns of performing an evaluation and management service by telephone and the availability of in person appointments. The patient expressed understanding and agreed to proceed.  Unable to perform video visit due to video visit attempted and failed and/or patient does not have video capability.   Some vital signs may be absent or patient reported.   Willette Brace, LPN   Subjective:   Jose Strong is a 71 y.o. male who presents for Medicare Annual/Subsequent preventive examination.  Review of Systems     Cardiac Risk Factors include: advanced age (>45mn, >>65women);hypertension;male gender;dyslipidemia     Objective:    There were no vitals filed for this visit. There is no height or weight on file to calculate BMI.     08/02/2021    9:45 AM 07/21/2020    9:46 AM 06/16/2019   10:19 AM 04/03/2014    2:11 PM  Advanced Directives  Does Patient Have a Medical Advance Directive? Yes Yes No No  Type of AParamedicof [place] will    Copy of [place]in Chart? No - copy requested     Would patient like information on creating a medical advance directive?   No - Patient declined No - patient declined information    Current Medications (verified) Outpatient Encounter Medications as of 08/02/2021  Medication Sig   Calcium-Magnesium-Zinc 167-83-8 MG TABS Take 1 tablet by mouth daily.   carvedilol (COREG) 12.5 MG tablet Take 1 tablet (12.5 mg total) by mouth 2  (two) times daily with a meal.   clotrimazole-betamethasone (LOTRISONE) cream Apply 1 application topically 2 (two) times daily.   dupilumab (DUPIXENT) 200 MG/1.14ML prefilled syringe Inject 200 mg into the skin once.   Multiple Vitamin (MULTIVITAMIN) tablet Take 1 tablet by mouth daily.   Omega-3 Fatty Acids (FISH OIL) 1000 MG CPDR 3000 mg by mouth twice daily.   tadalafil (CIALIS) 5 MG tablet Take 2 tablets (10 mg total) by mouth daily as needed for erectile dysfunction.   vitamin E 180 MG (400 UNITS) capsule Take 400 Units by mouth daily.   No facility-administered encounter medications on file as of 08/02/2021.    Allergies (verified) Amlodipine   History: Past Medical History:  Diagnosis [place]   [place]   [place]   Essential hypertension 05/20/2018   [place]   stage 1 A, removed by Dr. EOrbie Hurstand [facility]   [facility] 20[facility]   Past Surgical History:  Procedure Laterality Date   MOHS SURGERY  03/12/2009   MOHS SURGERY Right 07/2020   non-invasive melanoma   TONSILLECTOMY  03/12/1962   TONSILLECTOMY     Family History  Problem Relation Age of Onset   COPD Mother    Angina Mother    Social History   Socioeconomic History   Marital status: Widowed    Spouse name: Not on file   Number of children: 0   Years of education: Not on file  Highest education level: Not on file  Occupational History   Not on file  Tobacco Use   Smoking status: Former   Smokeless tobacco: Never  Substance and Sexual Activity   Alcohol use: Yes    Comment: 2-4 beers per day   Drug use: Not on file   Sexual activity: Not on file  Other Topics Concern   Not on file  Social History Narrative   Wife died 01-Jun-2014, had cancer   Unemployed    Completed HS   Not working   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     He does not have children   Enjoys fishing/golf, recently caring for his wife.     Social  Determinants of Health   Financial Resource Strain: Low Risk    Difficulty of Paying Living Expenses: Not hard at all  Food Insecurity: No Food Insecurity   Worried About Charity fundraiser in the Last Year: Never true   [place] in the Last Year: Never true  Transportation Needs: No Transportation Needs   Lack of Transportation (Medical): No   Lack of Transportation (Non-Medical): No  Physical Activity: Sufficiently Active   Days of Exercise per Week: 4 days   Minutes of Exercise per Session: 40 min  Stress: No Stress Concern Present   Feeling of Stress : Not at all  Social Connections: Moderately Isolated   Frequency of Communication with Friends and Family: More than three times a week   Frequency of Social Gatherings with Friends and Family: More than three times a week   Attends Religious Services: 1 to 4 times per year   Active Member of [employer] or Organizations: No   Attends Archivist Meetings: Never   Marital Status: Widowed    Tobacco Counseling Counseling given: Not Answered   Clinical Intake:  Pre-visit preparation completed: Yes  Pain : No/denies pain     BMI - recorded: 25.54 Nutritional Status: BMI 25 -29 Overweight Nutritional Risks: None Diabetes: No  How often do you need to have someone help you when you read instructions, pamphlets, or other written materials from your doctor or pharmacy?: 1 - Never  Diabetic?No  Interpreter Needed?: No  Information entered by :: Charlott Rakes, LPN   Activities of Daily Living    08/02/2021    9:47 AM  In your present state of health, do you have any difficulty performing the following activities:  Hearing? 1  Vision? 0  Difficulty concentrating or making decisions? 0  Walking or climbing stairs? 0  Dressing or bathing? 0  Doing errands, shopping? 0  Preparing Food and eating ? N  Using the Toilet? N  In the past six months, have you accidently leaked urine? N  Do you have problems  with loss of bowel control? N  Managing your Medications? N  Managing your Finances? N  Housekeeping or managing your Housekeeping? N    Patient Care Team: [name], NP as PCP - General (Internal Medicine)  Indicate any recent Medical Services you may have received from other than [facility] providers in the past year (date may be approximate).     Assessment:   This is a routine wellness examination for Jose Strong.  Hearing/Vision screen Hearing Screening - Comments:: Pt stated slight loss of hearing issues Vision Screening - Comments:: [facility]   Dietary issues and exercise activities discussed: Current Exercise Habits: Home exercise routine, Time (Minutes): 45, Frequency (Times/Week):  4, Weekly Exercise (Minutes/Week): 180   Goals Addressed             This Visit's Progress    Patient Stated       Continue walking and eating right        Depression Screen    08/02/2021    9:44 AM 07/21/2020    9:50 AM 06/16/2019   10:22 AM 05/19/2019    8:44 AM 01/08/2018   11:01 AM 02/10/2016    9:10 AM 12/27/2015    9:29 AM  PHQ 2/9 Scores  PHQ - 2 Score 0 0 0 0 0 0 0  PHQ- 9 Score     0      Fall Risk    08/02/2021    9:46 AM 07/21/2020    9:48 AM 06/16/2019   10:21 AM 05/19/2019    8:43 AM 02/18/2018   10:28 AM  Fall Risk   Falls in the past year? 0 0 0 0 0  Number falls in past yr: 0 0 0 0   Injury with Fall? 0 0 0 0 0  Risk for fall due to : Impaired vision      Follow up Falls prevention discussed Falls prevention discussed Education provided;Falls prevention discussed      FALL RISK PREVENTION PERTAINING TO THE HOME:  Any stairs in or around the home? Yes  If so, are there any without handrails? No  Home free of loose throw rugs in walkways, pet beds, electrical cords, etc? Yes  Adequate lighting in your home to reduce risk of falls? Yes   ASSISTIVE DEVICES UTILIZED TO PREVENT FALLS:  Life alert? No  Use of a cane,  walker or w/c? No  Grab bars in the bathroom? Yes  Shower chair or bench in shower? No  Elevated toilet seat or a handicapped toilet? No   TIMED UP AND GO:  Was the test performed? No .  Cognitive Function:        08/02/2021    9:47 AM  6CIT Screen  What Year? 0 points  What month? 0 points  What time? 0 points  Count back from 20 0 points  Months in reverse 0 points  Repeat phrase 2 points  Total Score 2 points    Immunizations Immunization History  Administered Date(s) Administered   Fluad Quad(high Dose 65+) 11/18/2018, 11/24/2019, 12/06/2020   Influenza, High Dose Seasonal PF 01/08/2018   Influenza,inj,Quad PF,6+ Mos 12/08/2012, 12/08/2013, 12/27/2015, 12/31/2016   PFIZER(Purple Top)SARS-COV-2 Vaccination 05/27/2019, 06/17/2019, 02/09/2020   Pneumococcal Conjugate-13 01/08/2018   Pneumococcal Polysaccharide-23 11/24/2019   Td 02/10/2016   Tdap 03/12/2006   Zoster, Live 12/08/2013    TDAP status: Up to date  Flu Vaccine status: Up to date  Pneumococcal vaccine status: Up to date  Covid-19 vaccine status: Completed vaccines  Qualifies for Shingles Vaccine? Yes   Zostavax completed No   Shingrix Completed?: No.    Education has been provided regarding the importance of this vaccine. Patient has been advised to call insurance company to determine out of pocket expense if they have not yet received this vaccine. Advised may also receive vaccine at local pharmacy or Health Dept. Verbalized acceptance and understanding.  Screening Tests Health Maintenance  Topic Date Due   Zoster Vaccines- Shingrix (1 of 2) Never done   COVID-19 Vaccine (4 - Booster for Pfizer series) 04/05/2020   COLON CANCER SCREENING ANNUAL FOBT  01/14/2021   INFLUENZA VACCINE  10/10/2021   Fecal DNA (Cologuard)  05/23/2024   TETANUS/TDAP  02/09/2026   Pneumonia Vaccine 66+ Years old  Completed   Hepatitis C Screening  Completed   HPV VACCINES  Aged Out    Health Maintenance  Health  Maintenance Due  Topic Date Due   Zoster Vaccines- Shingrix (1 of 2) Never done   COVID-19 Vaccine (4 - Booster for Pfizer series) 04/05/2020   COLON CANCER SCREENING ANNUAL FOBT  01/14/2021    Colorectal cancer screening: Type of screening: Cologuard. Completed 05/23/21. Repeat every 3 years   Additional Screening:  Hepatitis C Screening:  Completed 09/17/17  Vision Screening: Recommended annual ophthalmology exams for early detection of glaucoma and other disorders of the eye. Is the patient up to date with their annual eye exam?  No  Who is the provider or what is the name of the office in which the patient attends annual eye exams? [facility]  If pt is not established with a provider, would they like to be referred to a provider to establish care? No .   Dental Screening: Recommended annual dental exams for proper oral hygiene  Community Resource Referral / Chronic Care Management: CRR required this visit?  No   CCM required this visit?  No      Plan:     I have personally reviewed and noted the following in the patient's chart:   Medical and social history Use of alcohol, tobacco or illicit drugs  Current medications and supplements including opioid prescriptions. Patient is not currently taking opioid prescriptions. Functional ability and status Nutritional status Physical activity Advanced directives List of other physicians Hospitalizations, surgeries, and ER visits in previous 12 months Vitals Screenings to include cognitive, depression, and falls Referrals and appointments  In addition, I have reviewed and discussed with patient certain preventive protocols, quality metrics, and best practice recommendations. A written personalized care plan for preventive services as well as general preventive health recommendations were provided to patient.     Willette Brace, LPN   10/11/6551   Nurse Notes: None

## 2021-08-08 ENCOUNTER — Ambulatory Visit: Payer: Self-pay | Admitting: General Surgery

## 2021-08-08 DIAGNOSIS — K429 Umbilical hernia without obstruction or gangrene: Secondary | ICD-10-CM | POA: Diagnosis not present

## 2021-08-08 DIAGNOSIS — K4020 Bilateral inguinal hernia, without obstruction or gangrene, not specified as recurrent: Secondary | ICD-10-CM | POA: Diagnosis not present

## 2021-08-21 DIAGNOSIS — M62830 Muscle spasm of back: Secondary | ICD-10-CM | POA: Diagnosis not present

## 2021-08-21 DIAGNOSIS — M9903 Segmental and somatic dysfunction of lumbar region: Secondary | ICD-10-CM | POA: Diagnosis not present

## 2021-08-21 DIAGNOSIS — M9901 Segmental and somatic dysfunction of cervical region: Secondary | ICD-10-CM | POA: Diagnosis not present

## 2021-08-21 DIAGNOSIS — M5413 Radiculopathy, cervicothoracic region: Secondary | ICD-10-CM | POA: Diagnosis not present

## 2021-08-21 DIAGNOSIS — M5417 Radiculopathy, lumbosacral region: Secondary | ICD-10-CM | POA: Diagnosis not present

## 2021-10-02 ENCOUNTER — Other Ambulatory Visit: Payer: Self-pay | Admitting: Family

## 2021-10-11 ENCOUNTER — Telehealth: Payer: Self-pay | Admitting: Family

## 2021-10-11 ENCOUNTER — Ambulatory Visit (INDEPENDENT_AMBULATORY_CARE_PROVIDER_SITE_OTHER): Payer: Medicare HMO | Admitting: Family

## 2021-10-11 VITALS — BP 136/80 | HR 60 | Temp 98.4°F | Resp 16 | Wt 174.0 lb

## 2021-10-11 DIAGNOSIS — N529 Male erectile dysfunction, unspecified: Secondary | ICD-10-CM | POA: Diagnosis not present

## 2021-10-11 DIAGNOSIS — E785 Hyperlipidemia, unspecified: Secondary | ICD-10-CM | POA: Diagnosis not present

## 2021-10-11 DIAGNOSIS — I10 Essential (primary) hypertension: Secondary | ICD-10-CM

## 2021-10-11 DIAGNOSIS — R972 Elevated prostate specific antigen [PSA]: Secondary | ICD-10-CM | POA: Diagnosis not present

## 2021-10-11 DIAGNOSIS — R739 Hyperglycemia, unspecified: Secondary | ICD-10-CM | POA: Diagnosis not present

## 2021-10-11 MED ORDER — TADALAFIL 20 MG PO TABS: 10.0000 mg | ORAL_TABLET | ORAL | 11 refills | Status: DC | PRN

## 2021-10-11 NOTE — Assessment & Plan Note (Signed)
Mild hyperglycemia.  We discussed diet/exercise/weight loss.

## 2021-10-11 NOTE — Assessment & Plan Note (Signed)
Uncontrolled on '15mg'$  of cialis. Will try '20mg'$  .

## 2021-10-11 NOTE — Assessment & Plan Note (Signed)
Lab Results  Component Value Date   PSA 4.44 (H) 12/06/2020   PSA 3.24 11/18/2018   PSA 3.04 12/31/2016   Reports last PSA numbers at urology had trended down. Will follow back up in the end of the year.

## 2021-10-11 NOTE — Telephone Encounter (Signed)
Please update cologuard in metrics.

## 2021-10-11 NOTE — Assessment & Plan Note (Signed)
BP Readings from Last 3 Encounters:  10/11/21 136/80  07/11/21 125/88  07/04/21 (!) 154/77   Stable on carvedilol 12.'5mg'$  twice daily.

## 2021-10-11 NOTE — Progress Notes (Signed)
Subjective:   By signing my name below, I, Luiz Ochoa, attest that this documentation has been prepared under the direction and in the presence of [name], NP 10/11/2021     Patient ID: Jose Strong, male    DOB: Aug 10, 1950, 71 y.o.   MRN: [id number]  Chief Complaint  Patient presents with   Hypertension    Here for follow up   Hyperlipidemia    Here for follow up    HPI Patient is in today for follow up visit.  Medications: He continues to take the carvedilol 12.5 twice a day. Remains complaint with fish oil supplements and tadalafil medication. The tadalafil mildly improved his symptoms when he increased his dose, and agrees to take 20 mg of tadalafil.   Of note, He has done the Cologuard.   PSA: He follows up regularly with urology for his PSA, and states his prostate antigen levels went down.  Exercise: He has tried to exercise but has not been able to due to weather and air conditions.  Upcoming surgery: At the end of this month he plans to have an operation to resolve his hernia.   Health Maintenance Due  Topic Date Due   Zoster Vaccines- Shingrix (1 of 2) Never done   COVID-19 Vaccine (4 - Booster for [employer] series) 04/05/2020   COLON CANCER SCREENING ANNUAL FOBT  01/14/2021   INFLUENZA VACCINE  10/10/2021    Past Medical History:  Diagnosis Date   Cancer ([place]) Jun 12, 2009   melanoma --neck   Essential hypertension 05/20/2018   Malignant melanoma ([place]) 05/19/2020   stage 1 A, removed by Dr. Orbie Hurst and refered to [facility] for wide excision   Melanoma [facility]) 06/12/20    Past Surgical History:  Procedure Laterality Date   MOHS SURGERY  03/12/2009   MOHS SURGERY Right 07/2020   non-invasive melanoma   TONSILLECTOMY  03/12/1962   TONSILLECTOMY      Family History  Problem Relation Age of Onset   COPD Mother    Angina Mother     Social History   Socioeconomic History   Marital status: Widowed    Spouse name: Not on file   Number of  children: 0   Years of education: Not on file   Highest education level: Not on file  Occupational History   Not on file  Tobacco Use   Smoking status: Former   Smokeless tobacco: Never  Substance and Sexual Activity   Alcohol use: Yes    Comment: 2-4 beers per day   Drug use: Not on file   Sexual activity: Not on file  Other Topics Concern   Not on file  Social History Narrative   Wife died 2014-06-13, had cancer   Unemployed    Completed HS   Not working   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     He does not have children   Enjoys fishing/golf, recently caring for his wife.     Social Determinants of Health   Financial Resource Strain: Low Risk  (08/02/2021)   Overall Financial Resource Strain (CARDIA)    Difficulty of Paying Living Expenses: Not hard at all  Food Insecurity: No Food Insecurity (08/02/2021)   Hunger Vital Sign    Worried About Running Out of Food in the Last Year: Never true    Ran Out of Food in the Last Year: Never true  Transportation Needs: No Transportation Needs (08/02/2021)   PRAPARE - Transportation    Lack of Transportation (  Medical): No    Lack of Transportation (Non-Medical): No  Physical Activity: Sufficiently Active (08/02/2021)   Exercise Vital Sign    Days of Exercise per Week: 4 days    Minutes of Exercise per Session: 40 min  Stress: No Stress Concern Present (08/02/2021)   [place]    Feeling of Stress : Not at all  Social Connections: Moderately Isolated (08/02/2021)   Social Connection and Isolation Panel [NHANES]    Frequency of Communication with Friends and Family: More than three times a week    Frequency of Social Gatherings with Friends and Family: More than three times a week    Attends Religious Services: 1 to 4 times per year    Active Member of [employer] or Organizations: No    Attends Archivist Meetings: Never    Marital Status: Widowed  Intimate Partner  Violence: Not At Risk (08/02/2021)   Humiliation, Afraid, Rape, and Kick questionnaire    Fear of Current or Ex-Partner: No    Emotionally Abused: No    Physically Abused: No    Sexually Abused: No    Outpatient Medications Prior to Visit  Medication Sig Dispense Refill   Calcium-Magnesium-Zinc 167-83-8 MG TABS Take 1 tablet by mouth daily.     carvedilol (COREG) 12.5 MG tablet TAKE 1 TABLET (12.'5MG'$  TOTAL) BY MOUTH TWICE A DAY WITH MEALS 180 tablet 1   clotrimazole-betamethasone (LOTRISONE) cream Apply 1 application topically 2 (two) times daily. 45 g 1   dupilumab (DUPIXENT) 200 MG/1.14ML prefilled syringe Inject 200 mg into the skin once.     Multiple Vitamin (MULTIVITAMIN) tablet Take 1 tablet by mouth daily.     Omega-3 Fatty Acids (FISH OIL) 1000 MG CPDR 3000 mg by mouth twice daily.     vitamin E 180 MG (400 UNITS) capsule Take 400 Units by mouth daily.     tadalafil (CIALIS) 5 MG tablet Take 2 tablets (10 mg total) by mouth daily as needed for erectile dysfunction. 30 tablet 5   No facility-administered medications prior to visit.    Allergies  Allergen Reactions   Amlodipine     rash    ROS     Objective:    Physical Exam Constitutional:      Appearance: Normal appearance. He is not ill-appearing.  HENT:     Head: Normocephalic and atraumatic.     Right Ear: External ear normal.     Left Ear: External ear normal.  Eyes:     Extraocular Movements: Extraocular movements intact.     Right eye: No nystagmus.     Left eye: No nystagmus.     Pupils: Pupils are equal, round, and reactive to light.  Cardiovascular:     Rate and Rhythm: Normal rate and regular rhythm.     Pulses: Normal pulses.     Heart sounds: Normal heart sounds. No murmur heard.    No gallop.  Pulmonary:     Effort: Pulmonary effort is normal. No respiratory distress.     Breath sounds: Normal breath sounds. No wheezing or rales.  Lymphadenopathy:     Cervical: No cervical adenopathy.  Skin:     General: Skin is warm and dry.  Neurological:     Mental Status: He is alert and oriented to person, place, and time.  Psychiatric:        Judgment: Judgment normal.     BP 136/80 (BP Location: Right Arm, Patient Position: Sitting,  Cuff Size: Small)   Pulse 60   Temp 98.4 F (36.9 C) (Oral)   Resp 16   Wt 174 lb (78.9 kg)   SpO2 97%   BMI 24.97 kg/m  Wt Readings from Last 3 Encounters:  10/11/21 174 lb (78.9 kg)  07/11/21 178 lb (80.7 kg)  07/04/21 176 lb (79.8 kg)       Assessment & Plan:   Problem List Items Addressed This Visit       Unprioritized   Hyperlipidemia    Lab Results  Component Value Date   CHOL 205 (H) 12/06/2020   HDL 53.10 12/06/2020   LDLCALC 115 (H) 12/06/2020   LDLDIRECT 94.0 04/25/2017   TRIG 187.0 (H) 12/06/2020   CHOLHDL 4 12/06/2020  Continues fish oil.       Relevant Medications   tadalafil (CIALIS) 20 MG tablet   Hyperglycemia - Primary    Mild hyperglycemia.  We discussed diet/exercise/weight loss.       Essential hypertension    BP Readings from Last 3 Encounters:  10/11/21 136/80  07/11/21 125/88  07/04/21 (!) 154/77  Stable on carvedilol 12.'5mg'$  twice daily.      Relevant Medications   tadalafil (CIALIS) 20 MG tablet   Elevated PSA    Lab Results  Component Value Date   PSA 4.44 (H) 12/06/2020   PSA 3.24 11/18/2018   PSA 3.04 12/31/2016  Reports last PSA numbers at urology had trended down. Will follow back up in the end of the year.          Meds ordered this encounter  Medications   tadalafil (CIALIS) 20 MG tablet    Sig: Take 0.5-1 tablets (10-20 mg total) by mouth every other day as needed for erectile dysfunction.    Dispense:  10 tablet    Refill:  11    Order Specific Question:   Supervising Provider    Answer:   Penni Homans A [[id number]]    I, Nance Pear, NP, personally preformed the services described in this documentation.  All medical record entries made by the scribe were at my  direction and in my presence.  I have reviewed the chart and discharge instructions (if applicable) and agree that the record reflects my personal performance and is accurate and complete. 10/11/2021   I,Tinashe Williams,acting as a scribe for Nance Pear, NP.,have documented all relevant documentation on the behalf of Nance Pear, NP,as directed by  Nance Pear, NP while in the presence of Nance Pear, NP.    Nance Pear, NP

## 2021-10-11 NOTE — Assessment & Plan Note (Signed)
Lab Results  Component Value Date   CHOL 205 (H) 12/06/2020   HDL 53.10 12/06/2020   LDLCALC 115 (H) 12/06/2020   LDLDIRECT 94.0 04/25/2017   TRIG 187.0 (H) 12/06/2020   CHOLHDL 4 12/06/2020   Continues fish oil.

## 2021-10-12 DIAGNOSIS — M5417 Radiculopathy, lumbosacral region: Secondary | ICD-10-CM | POA: Diagnosis not present

## 2021-10-12 DIAGNOSIS — M9903 Segmental and somatic dysfunction of lumbar region: Secondary | ICD-10-CM | POA: Diagnosis not present

## 2021-10-12 DIAGNOSIS — M5413 Radiculopathy, cervicothoracic region: Secondary | ICD-10-CM | POA: Diagnosis not present

## 2021-10-12 DIAGNOSIS — M62830 Muscle spasm of back: Secondary | ICD-10-CM | POA: Diagnosis not present

## 2021-10-12 DIAGNOSIS — M9901 Segmental and somatic dysfunction of cervical region: Secondary | ICD-10-CM | POA: Diagnosis not present

## 2021-10-12 NOTE — Telephone Encounter (Signed)
Information updated

## 2021-10-13 DIAGNOSIS — M9901 Segmental and somatic dysfunction of cervical region: Secondary | ICD-10-CM | POA: Diagnosis not present

## 2021-10-13 DIAGNOSIS — M5413 Radiculopathy, cervicothoracic region: Secondary | ICD-10-CM | POA: Diagnosis not present

## 2021-10-13 DIAGNOSIS — M62830 Muscle spasm of back: Secondary | ICD-10-CM | POA: Diagnosis not present

## 2021-10-13 DIAGNOSIS — M9903 Segmental and somatic dysfunction of lumbar region: Secondary | ICD-10-CM | POA: Diagnosis not present

## 2021-10-13 DIAGNOSIS — M5417 Radiculopathy, lumbosacral region: Secondary | ICD-10-CM | POA: Diagnosis not present

## 2021-11-03 NOTE — Pre-Procedure Instructions (Addendum)
Jose Strong  11/03/2021     Your procedure is scheduled on Thursday, August 31..  Report to [facility] Admitting at 5:30 AM   Call this number if you have problems the morning of surgery:[phone]- this is the pre-surgery desk.For any other questions, please call [phone], Monday - Friday 8 AM - 4 PM.   Remember:  Do not eat after midnight, Wednesday, August 30.  You may drink clear liquids until 4:30 AM .   Clear liquids allowed are:                    Water, Carbonated beverages (diabetics please choose diet or no sugar options), Clear Tea (No creamer, milk, or cream, including half & half and powdered creamer), and Black Coffee Only (No creamer, milk or cream, including half & half and powdered creamer)    Take these medicines the morning of surgery with A SIP OF WATER:  carvedilol (COREG)             loratadine (CLARITIN)  STOP taking Aspirin, Aspirin Products (Goody Powder, Excedrin Migraine), Ibuprofen (Advil), Naproxen (Aleve), Vitamins and Herbal Products (ie Fish Oil).    - Preparing for Surgery Before surgery, you can play an important role.  Because skin is not sterile, your skin needs to be as free of germs as possible.  You can reduce the number of germs on you skin by washing with CHG (chlorahexidine gluconate) soap before surgery.  CHG is an antiseptic cleaner which kills germs and bonds with the skin to continue killing germs even after washing.  Oral Hygiene is also important in reducing the risk of infection.  Remember to brush your teeth with your regular toothpaste the morning of surgery.  Please DO NOT use if you have an allergy to CHG or antibacterial soaps.  If your skin becomes reddened/irritated stop using the CHG and inform your nurse when you arrive at Short Stay.  Do not shave (including legs and underarms) for at least 48 hours prior to the first CHG shower.  You may shave your face.  Please follow these instructions  carefully:  1.  Shower with CHG Soap the night before surgery and the morning of Surgery.  2.  If you choose to wash your hair, wash your hair first as usual with your normal shampoo.  3.  After you shampoo, rinse your hair and body thoroughly to remove the shampoo. Wash your face and private area with your Home soap,rinse thoroughly 4.  Use CHG as you would any other liquid soap.  You can apply chg directly to the skin and wash gently with a scrungie or washcloth.           5.  Apply the CHG Soap to your body ONLY FROM THE NECK DOWN.   Do not use on open wounds or open sores. Avoid contact with your eyes, ears, mouth and genitals (private parts).    6.  Wash thoroughly, paying special attention to the area where your surgery will be performed.  7.  Thoroughly rinse your body with warm water from the neck down.  8.  DO NOT shower/wash with your normal soap after using and rinsing off the CHG Soap.  9.  Pat yourself dry with a clean towel.            10.  Wear clean pajamas.            11.  Place clean sheets on  your bed the night of your first shower and do not sleep with pets.  Morning of surgery: Shower as instructed above. Do not apply any lotions/deodorants, lotions powers or cologen. Please wear clean clothes to the hospital/surgery center. Remember to brush your teeth with toothpaste.  Do not wear jewelry, make-up or nail polish.  Do not wear lotions, powders, or perfumes, or deodorant.   Men may shave face and neck.  Do not bring valuables to the hospital.  [facility] is not responsible for any belongings or valuables.  Contacts, dentures or bridgework may not be worn into surgery.  Leave your suitcase in the car.  After surgery it may be brought to your room.  For patients admitted to the hospital, discharge time will be determined by your treatment team.  Patients discharged the day of surgery will not be allowed to drive home.    Please read over the fact sheets that you  were given.

## 2021-11-06 ENCOUNTER — Other Ambulatory Visit: Payer: Self-pay

## 2021-11-06 ENCOUNTER — Encounter (HOSPITAL_COMMUNITY): Payer: Self-pay

## 2021-11-06 ENCOUNTER — Encounter (HOSPITAL_COMMUNITY)
Admission: RE | Admit: 2021-11-06 | Discharge: 2021-11-06 | Disposition: A | Discharge status: Home / Self Care | Payer: Medicare HMO | Source: Ambulatory Visit | Attending: General Surgery | Admitting: General Surgery

## 2021-11-06 VITALS — BP 141/103 | HR 63 | Temp 98.2°F | Resp 17 | Ht 70.0 in | Wt 175.2 lb

## 2021-11-06 DIAGNOSIS — Z01818 Encounter for other preprocedural examination: Secondary | ICD-10-CM | POA: Diagnosis not present

## 2021-11-06 DIAGNOSIS — I10 Essential (primary) hypertension: Secondary | ICD-10-CM | POA: Diagnosis not present

## 2021-11-06 LAB — CBC
HCT: 48.3 % (ref 39.0–52.0)
Hemoglobin: 16.4 g/dL (ref 13.0–17.0)
MCH: 31.3 pg (ref 26.0–34.0)
MCHC: 34 g/dL (ref 30.0–36.0)
MCV: 92.2 fL (ref 80.0–100.0)
Platelets: 201 10*3/uL (ref 150–400)
RBC: 5.24 MIL/uL (ref 4.22–5.81)
RDW: 12.4 % (ref 11.5–15.5)
WBC: 10 10*3/uL (ref 4.0–10.5)
nRBC: 0 % (ref 0.0–0.2)

## 2021-11-06 LAB — BASIC METABOLIC PANEL
Anion gap: 8 (ref 5–15)
BUN: 11 mg/dL (ref 8–23)
CO2: 28 mmol/L (ref 22–32)
Calcium: 9.9 mg/dL (ref 8.9–10.3)
Chloride: 104 mmol/L (ref 98–111)
Creatinine, Ser: 0.77 mg/dL (ref 0.61–1.24)
GFR, Estimated: >60 mL/min (ref >60)
Glucose, Bld: 105 mg/dL — ABNORMAL HIGH (ref 70–99)
Potassium: 4.5 mmol/L (ref 3.5–5.1)
Sodium: 140 mmol/L (ref 135–145)

## 2021-11-06 NOTE — Progress Notes (Addendum)
PCP - Dr. Marlou Porch  Cardiologist - no  Chest x-ray - na  EKG - 10/17/21  Stress Test - no  ECHO - no  Cardiac Cath - no  Sleep Study - no CPAP - no  LABS-BMP, CBC  ASA-no  ERAS-yes  HA1C-na Fasting Blood Sugar - na 0 times a day  Anesthesia- BP on arrival diastolic was 601, rechecked after PAT appt blood pressure was 150/90.   Pt denies having chest pain, sob, or fever at this time. All instructions explained to the pt, with a verbal understanding of the material. Pt agrees to go over the instructions while at home for a better understanding. Pt also instructed to self quarantine after being tested for COVID-19. The opportunity to ask questions was provided.

## 2021-11-07 NOTE — Anesthesia Preprocedure Evaluation (Signed)
Anesthesia Evaluation  Patient identified by MRN, date of birth, ID band Patient awake    Reviewed: Allergy & Precautions, H&P , NPO status , Patient's Chart, lab work & pertinent test results  Airway Mallampati: II   Neck ROM: full    Dental   Pulmonary former smoker,    breath sounds clear to auscultation       Cardiovascular hypertension,  Rhythm:regular Rate:Normal     Neuro/Psych    GI/Hepatic   Endo/Other    Renal/GU      Musculoskeletal   Abdominal   Peds  Hematology   Anesthesia Other Findings   Reproductive/Obstetrics                            Anesthesia Physical Anesthesia Plan  ASA: 2  Anesthesia Plan: General   Post-op Pain Management: Regional block*   Induction: Intravenous  PONV Risk Score and Plan: 2 and Ondansetron, Dexamethasone, Midazolam and Treatment may vary due to age or medical condition  Airway Management Planned: Oral ETT  Additional Equipment:   Intra-op Plan:   Post-operative Plan: Extubation in OR  Informed Consent: I have reviewed the patients History and Physical, chart, labs and discussed the procedure including the risks, benefits and alternatives for the proposed anesthesia with the patient or authorized representative who has indicated his/her understanding and acceptance.     Dental advisory given  Plan Discussed with: CRNA, Anesthesiologist and Surgeon  Anesthesia Plan Comments: (PAT note written 11/07/2021 by Myra Gianotti, PA-C. )       Anesthesia Quick Evaluation

## 2021-11-07 NOTE — Progress Notes (Signed)
Anesthesia Chart Review:  Case: [id number] Date/Time: 11/09/21 0715   Procedures:      BILATERAL INGUINAL HERNIA REPAIR WITH MESH - GENERAL AND TAP BLOCK     HERNIA REPAIR UMBILICAL ADULT WITH MESH - GENERAL AND TAP BLOCK   Anesthesia type: General   Pre-op diagnosis: BILATERAL INGUINAL HERNIAS, UMBILCAL HERNIA   Location: [facility] OR ROOM 06 / [facility]   Surgeons: Jovita Kussmaul, MD       DISCUSSION: Patient is a 71 year old male scheduled for the above procedure.  History includes former smoker (quit 03/12/90), HTN, melanoma (s/p Mohs surgeries 2011, 2022).   He denied chest pain and SOB per PAT RN interview. HR 63 bpm with VS, and 51 bpm on EKG, PR 196 ms. He is on 12.5 mg Coreg BID, BP 141/103 on arrival, but 150/90 on recheck.   Anesthesia team to evaluate on the day of surgery.     VS: BP (!) 141/103   Pulse 63   Temp 36.8 C (Oral)   Resp 17   Ht '5\' 10"'$  (1.778 m)   Wt 79.5 kg   SpO2 100%   BMI 25.14 kg/m  BP 141/103 on arrival to PAT, recheck after PAT interview was 150/90. BP Readings from Last 3 Encounters:  11/06/21 (!) 141/103  10/11/21 136/80  07/11/21 125/88     PROVIDERS: [name], NP is PCP    LABS: Labs reviewed: Acceptable for surgery. (all labs ordered are listed, but only abnormal results are displayed)  Labs Reviewed  BASIC METABOLIC PANEL - Abnormal; Notable for the following components:      Result Value   Glucose, Bld 105 (*)    All other components within normal limits  CBC    EKG: 11/06/21: Sinus bradycardia at 51 bpm Left axis deviation Possible Anterior infarct , age undetermined Abnormal ECG When compared with ECG of 03-Apr-2014 14:32, the rate is slower Confirmed by Cristopher Peru [phone]) on 11/06/2021 10:31:24 PM   CV: ETT 04/08/14: ETT Interpretation:  normal - no evidence of ischemia by ST  analysis  Comments:  Hypertension at rest and with exercise.  Fair exercise tolerance.  Nonspecific ST depression (< 1 mm) and T wave  inversion in the  lateral leads is likely due to rate related conduction  abnormality    Past Medical History:  Diagnosis Date   Cancer ([place]) 2011   melanoma --neck   Essential hypertension 05/20/2018   Malignant melanoma ([place]) 05/19/2020   stage 1 A, removed by Dr. Orbie Hurst and refered to [facility] for wide excision   Melanoma ([place]) 2022    Past Surgical History:  Procedure Laterality Date   MOHS SURGERY  03/12/2009   MOHS SURGERY Right 07/2020   non-invasive melanoma   TONSILLECTOMY  03/12/1962   TONSILLECTOMY      MEDICATIONS:  acetaminophen (TYLENOL) 500 MG tablet   Calcium-Magnesium-Zinc 167-83-8 MG TABS   carvedilol (COREG) 12.5 MG tablet   clotrimazole-betamethasone (LOTRISONE) cream   dupilumab (DUPIXENT) 200 MG/1.14ML prefilled syringe   Garlic (GARLIQUE PO)   loratadine (CLARITIN) 10 MG tablet   Multiple Vitamin (MULTIVITAMIN) tablet   Omega-3 Fatty Acids (FISH OIL) 1000 MG CPDR   tadalafil (CIALIS) 10 MG tablet   tadalafil (CIALIS) 20 MG tablet   vitamin E 180 MG (400 UNITS) capsule   No current facility-administered medications for this encounter.    Myra Gianotti, PA-C Surgical Short Stay/Anesthesiology [facility] Phone [phone] [facility] Phone [phone] 11/07/2021 4:00 PM

## 2021-11-09 ENCOUNTER — Ambulatory Visit (HOSPITAL_COMMUNITY)
Admission: AD | Admit: 2021-11-09 | Discharge: 2021-11-09 | Disposition: A | Discharge status: Home / Self Care | Payer: Medicare HMO | Source: Ambulatory Visit | Attending: General Surgery | Admitting: General Surgery

## 2021-11-09 ENCOUNTER — Encounter (HOSPITAL_COMMUNITY)
Admission: AD | Disposition: A | Discharge status: Home / Self Care | Payer: Self-pay | Source: Ambulatory Visit | Attending: General Surgery

## 2021-11-09 ENCOUNTER — Other Ambulatory Visit: Payer: Self-pay

## 2021-11-09 ENCOUNTER — Ambulatory Visit (HOSPITAL_BASED_OUTPATIENT_CLINIC_OR_DEPARTMENT_OTHER): Payer: Medicare HMO

## 2021-11-09 ENCOUNTER — Ambulatory Visit (HOSPITAL_COMMUNITY): Payer: Medicare HMO | Admitting: Vascular Surgery

## 2021-11-09 ENCOUNTER — Encounter (HOSPITAL_COMMUNITY): Payer: Self-pay | Admitting: General Surgery

## 2021-11-09 DIAGNOSIS — K429 Umbilical hernia without obstruction or gangrene: Secondary | ICD-10-CM

## 2021-11-09 DIAGNOSIS — K4020 Bilateral inguinal hernia, without obstruction or gangrene, not specified as recurrent: Secondary | ICD-10-CM

## 2021-11-09 DIAGNOSIS — G8918 Other acute postprocedural pain: Secondary | ICD-10-CM | POA: Diagnosis not present

## 2021-11-09 DIAGNOSIS — Z87891 Personal history of nicotine dependence: Secondary | ICD-10-CM | POA: Insufficient documentation

## 2021-11-09 DIAGNOSIS — K4090 Unilateral inguinal hernia, without obstruction or gangrene, not specified as recurrent: Secondary | ICD-10-CM | POA: Diagnosis not present

## 2021-11-09 DIAGNOSIS — I10 Essential (primary) hypertension: Secondary | ICD-10-CM | POA: Insufficient documentation

## 2021-11-09 DIAGNOSIS — M7989 Other specified soft tissue disorders: Secondary | ICD-10-CM | POA: Diagnosis not present

## 2021-11-09 HISTORY — PX: UMBILICAL HERNIA REPAIR: SHX196

## 2021-11-09 HISTORY — PX: INGUINAL HERNIA REPAIR: SHX194

## 2021-11-09 SURGERY — REPAIR, HERNIA, INGUINAL, BILATERAL, ADULT
Anesthesia: General | Site: Abdomen

## 2021-11-09 MED ORDER — 0.9 % SODIUM CHLORIDE (POUR BTL) OPTIME
TOPICAL | Status: DC | PRN
  Administered 2021-11-09: 1000 mL

## 2021-11-09 MED ORDER — DEXAMETHASONE SODIUM PHOSPHATE 10 MG/ML IJ SOLN
INTRAMUSCULAR | Status: AC
  Filled 2021-11-09: qty 1

## 2021-11-09 MED ORDER — FENTANYL CITRATE (PF) 100 MCG/2ML IJ SOLN
INTRAMUSCULAR | Status: AC
  Filled 2021-11-09: qty 2

## 2021-11-09 MED ORDER — KETOROLAC TROMETHAMINE 30 MG/ML IJ SOLN
INTRAMUSCULAR | Status: AC
  Filled 2021-11-09: qty 1

## 2021-11-09 MED ORDER — PROPOFOL 10 MG/ML IV BOLUS
INTRAVENOUS | Status: AC
  Filled 2021-11-09: qty 20

## 2021-11-09 MED ORDER — CHLORHEXIDINE GLUCONATE CLOTH 2 % EX PADS: 6.0000 | MEDICATED_PAD | Freq: Once | CUTANEOUS | Status: DC

## 2021-11-09 MED ORDER — PROPOFOL 10 MG/ML IV BOLUS
INTRAVENOUS | Status: DC | PRN
  Administered 2021-11-09: 200 mg via INTRAVENOUS

## 2021-11-09 MED ORDER — FENTANYL CITRATE (PF) 250 MCG/5ML IJ SOLN
INTRAMUSCULAR | Status: DC | PRN
  Administered 2021-11-09: 50 ug via INTRAVENOUS
  Administered 2021-11-09 (×2): 25 ug via INTRAVENOUS
  Administered 2021-11-09: 50 ug via INTRAVENOUS

## 2021-11-09 MED ORDER — LACTATED RINGERS IV SOLN: INTRAVENOUS | Status: DC

## 2021-11-09 MED ORDER — FENTANYL CITRATE (PF) 250 MCG/5ML IJ SOLN
INTRAMUSCULAR | Status: AC
  Filled 2021-11-09: qty 5

## 2021-11-09 MED ORDER — LIDOCAINE 2% (20 MG/ML) 5 ML SYRINGE
INTRAMUSCULAR | Status: DC | PRN
  Administered 2021-11-09: 40 mg via INTRAVENOUS

## 2021-11-09 MED ORDER — BUPIVACAINE-EPINEPHRINE 0.25% -1:200000 IJ SOLN
INTRAMUSCULAR | Status: DC | PRN
  Administered 2021-11-09: 10 mL

## 2021-11-09 MED ORDER — ORAL CARE MOUTH RINSE: 15.0000 mL | Freq: Once | OROMUCOSAL | Status: AC

## 2021-11-09 MED ORDER — KETOROLAC TROMETHAMINE 15 MG/ML IJ SOLN
INTRAMUSCULAR | Status: DC | PRN
  Administered 2021-11-09: 15 mg via INTRAVENOUS

## 2021-11-09 MED ORDER — ONDANSETRON HCL 4 MG/2ML IJ SOLN
INTRAMUSCULAR | Status: AC
  Filled 2021-11-09: qty 2

## 2021-11-09 MED ORDER — OXYCODONE HCL 5 MG PO TABS
ORAL_TABLET | ORAL | Status: AC
  Filled 2021-11-09: qty 1

## 2021-11-09 MED ORDER — ONDANSETRON HCL 4 MG/2ML IJ SOLN
INTRAMUSCULAR | Status: DC | PRN
  Administered 2021-11-09 (×2): 4 mg via INTRAVENOUS

## 2021-11-09 MED ORDER — ONDANSETRON HCL 4 MG/2ML IJ SOLN: 4.0000 mg | Freq: Four times a day (QID) | INTRAMUSCULAR | Status: DC | PRN

## 2021-11-09 MED ORDER — ESMOLOL HCL 100 MG/10ML IV SOLN
INTRAVENOUS | Status: DC | PRN
  Administered 2021-11-09: 30 mg via INTRAVENOUS

## 2021-11-09 MED ORDER — MIDAZOLAM HCL 2 MG/2ML IJ SOLN
INTRAMUSCULAR | Status: AC
  Filled 2021-11-09: qty 2

## 2021-11-09 MED ORDER — ROCURONIUM BROMIDE 10 MG/ML (PF) SYRINGE
PREFILLED_SYRINGE | INTRAVENOUS | Status: AC
  Filled 2021-11-09: qty 10

## 2021-11-09 MED ORDER — SUGAMMADEX SODIUM 200 MG/2ML IV SOLN
INTRAVENOUS | Status: DC | PRN
  Administered 2021-11-09: 400 mg via INTRAVENOUS

## 2021-11-09 MED ORDER — GABAPENTIN 300 MG PO CAPS
300.0000 mg | ORAL_CAPSULE | ORAL | Status: AC
  Administered 2021-11-09: 300 mg via ORAL
  Filled 2021-11-09: qty 1

## 2021-11-09 MED ORDER — FENTANYL CITRATE (PF) 100 MCG/2ML IJ SOLN
25.0000 ug | INTRAMUSCULAR | Status: DC | PRN
  Administered 2021-11-09 (×5): 25 ug via INTRAVENOUS

## 2021-11-09 MED ORDER — SUGAMMADEX SODIUM 500 MG/5ML IV SOLN
INTRAVENOUS | Status: AC
  Filled 2021-11-09: qty 5

## 2021-11-09 MED ORDER — PHENYLEPHRINE 80 MCG/ML (10ML) SYRINGE FOR IV PUSH (FOR BLOOD PRESSURE SUPPORT)
PREFILLED_SYRINGE | INTRAVENOUS | Status: DC | PRN
  Administered 2021-11-09: 80 ug via INTRAVENOUS

## 2021-11-09 MED ORDER — CEFAZOLIN SODIUM-DEXTROSE 2-4 GM/100ML-% IV SOLN
2.0000 g | INTRAVENOUS | Status: AC
  Administered 2021-11-09: 2 g via INTRAVENOUS
  Filled 2021-11-09: qty 100

## 2021-11-09 MED ORDER — CHLORHEXIDINE GLUCONATE 0.12 % MT SOLN
15.0000 mL | Freq: Once | OROMUCOSAL | Status: AC
  Administered 2021-11-09: 15 mL via OROMUCOSAL
  Filled 2021-11-09: qty 15

## 2021-11-09 MED ORDER — EPHEDRINE SULFATE-NACL 50-0.9 MG/10ML-% IV SOSY
PREFILLED_SYRINGE | INTRAVENOUS | Status: DC | PRN
  Administered 2021-11-09: 5 mg via INTRAVENOUS
  Administered 2021-11-09 (×2): 2.5 mg via INTRAVENOUS
  Administered 2021-11-09: 5 mg via INTRAVENOUS
  Administered 2021-11-09: 2.5 mg via INTRAVENOUS

## 2021-11-09 MED ORDER — ACETAMINOPHEN 500 MG PO TABS
1000.0000 mg | ORAL_TABLET | ORAL | Status: AC
  Administered 2021-11-09: 1000 mg via ORAL
  Filled 2021-11-09: qty 2

## 2021-11-09 MED ORDER — BUPIVACAINE-EPINEPHRINE (PF) 0.25% -1:200000 IJ SOLN
INTRAMUSCULAR | Status: AC
  Filled 2021-11-09: qty 30

## 2021-11-09 MED ORDER — ROPIVACAINE HCL 5 MG/ML IJ SOLN
INTRAMUSCULAR | Status: DC | PRN
  Administered 2021-11-09 (×2): 20 mL via PERINEURAL

## 2021-11-09 MED ORDER — OXYCODONE HCL 5 MG PO TABS: 5.0000 mg | ORAL_TABLET | Freq: Four times a day (QID) | ORAL | 0 refills | Status: DC | PRN

## 2021-11-09 MED ORDER — ROCURONIUM BROMIDE 10 MG/ML (PF) SYRINGE
PREFILLED_SYRINGE | INTRAVENOUS | Status: DC | PRN
  Administered 2021-11-09: 20 mg via INTRAVENOUS
  Administered 2021-11-09: 60 mg via INTRAVENOUS
  Administered 2021-11-09 (×3): 20 mg via INTRAVENOUS

## 2021-11-09 MED ORDER — OXYCODONE HCL 5 MG PO TABS
5.0000 mg | ORAL_TABLET | Freq: Once | ORAL | Status: AC | PRN
  Administered 2021-11-09: 5 mg via ORAL

## 2021-11-09 MED ORDER — CELECOXIB 200 MG PO CAPS
200.0000 mg | ORAL_CAPSULE | ORAL | Status: DC
  Filled 2021-11-09: qty 1

## 2021-11-09 MED ORDER — DEXAMETHASONE SODIUM PHOSPHATE 10 MG/ML IJ SOLN
INTRAMUSCULAR | Status: DC | PRN
  Administered 2021-11-09: 10 mg via INTRAVENOUS

## 2021-11-09 MED ORDER — LIDOCAINE 2% (20 MG/ML) 5 ML SYRINGE
INTRAMUSCULAR | Status: AC
  Filled 2021-11-09: qty 5

## 2021-11-09 MED ORDER — OXYCODONE HCL 5 MG/5ML PO SOLN: 5.0000 mg | Freq: Once | ORAL | Status: AC | PRN

## 2021-11-09 MED ORDER — MIDAZOLAM HCL 2 MG/2ML IJ SOLN
INTRAMUSCULAR | Status: DC | PRN
  Administered 2021-11-09: 2 mg via INTRAVENOUS

## 2021-11-09 SURGICAL SUPPLY — 52 items
BAG COUNTER SPONGE SURGICOUNT (BAG) IMPLANT
BENZOIN TINCTURE PRP APPL 2/3 (GAUZE/BANDAGES/DRESSINGS) ×1 IMPLANT
BLADE HEX COATED 2.75 (ELECTRODE) ×1 IMPLANT
BLADE SURG 15 STRL LF DISP TIS (BLADE) ×1 IMPLANT
BLADE SURG 15 STRL SS (BLADE) ×1
BLADE SURG SZ10 CARB STEEL (BLADE) ×2 IMPLANT
DERMABOND ADVANCED (GAUZE/BANDAGES/DRESSINGS) ×2
DERMABOND ADVANCED .7 DNX12 (GAUZE/BANDAGES/DRESSINGS) IMPLANT
DRAIN PENROSE .5X12 LATEX STL (DRAIN) IMPLANT
DRAPE LAPAROSCOPIC ABDOMINAL (DRAPES) ×1 IMPLANT
DRSG TEGADERM 4X4.75 (GAUZE/BANDAGES/DRESSINGS) ×1 IMPLANT
ELECT REM PT RETURN 15FT ADLT (MISCELLANEOUS) ×1 IMPLANT
GAUZE SPONGE 4X4 12PLY STRL (GAUZE/BANDAGES/DRESSINGS) ×1 IMPLANT
GLOVE BIO SURGEON STRL SZ7.5 (GLOVE) ×2 IMPLANT
GLOVE BIOGEL PI IND STRL 7.0 (GLOVE) ×1 IMPLANT
GLOVE BIOGEL PI INDICATOR 7.0 (GLOVE) ×1
GOWN STRL REUS W/ TWL LRG LVL3 (GOWN DISPOSABLE) ×1 IMPLANT
GOWN STRL REUS W/ TWL XL LVL3 (GOWN DISPOSABLE) ×1 IMPLANT
GOWN STRL REUS W/TWL LRG LVL3 (GOWN DISPOSABLE) ×1
GOWN STRL REUS W/TWL XL LVL3 (GOWN DISPOSABLE) ×1
KIT BASIN OR (CUSTOM PROCEDURE TRAY) ×1 IMPLANT
KIT TURNOVER KIT A (KITS) IMPLANT
MESH ULTRAPRO 3X6 7.6X15CM (Mesh General) IMPLANT
NDL HYPO 25X1 1.5 SAFETY (NEEDLE) ×1 IMPLANT
NEEDLE HYPO 22GX1.5 SAFETY (NEEDLE) ×1 IMPLANT
NEEDLE HYPO 25X1 1.5 SAFETY (NEEDLE) ×1 IMPLANT
NS IRRIG 1000ML POUR BTL (IV SOLUTION) ×1 IMPLANT
PACK BASIC VI WITH GOWN DISP (CUSTOM PROCEDURE TRAY) ×1 IMPLANT
PENCIL SMOKE EVACUATOR (MISCELLANEOUS) IMPLANT
SOL PREP POV-IOD 4OZ 10% (MISCELLANEOUS) ×1 IMPLANT
SPIKE FLUID TRANSFER (MISCELLANEOUS) ×1 IMPLANT
STRIP CLOSURE SKIN 1/2X4 (GAUZE/BANDAGES/DRESSINGS) ×1 IMPLANT
SUT MNCRL AB 4-0 PS2 18 (SUTURE) ×1 IMPLANT
SUT NOVA NAB DX-16 0-1 5-0 T12 (SUTURE) ×2 IMPLANT
SUT NOVA NAB GS-21 1 T12 (SUTURE) IMPLANT
SUT PROLENE 0 CT 1 30 (SUTURE) ×1 IMPLANT
SUT PROLENE 0 CT 1 CR/8 (SUTURE) IMPLANT
SUT PROLENE 2 0 SH DA (SUTURE) IMPLANT
SUT SILK 3 0 (SUTURE) ×1
SUT SILK 3-0 18XBRD TIE 12 (SUTURE) IMPLANT
SUT VIC AB 0 CT1 27 (SUTURE) ×2
SUT VIC AB 0 CT1 27XBRD ANBCTR (SUTURE) IMPLANT
SUT VIC AB 2-0 CT1 27 (SUTURE) ×1
SUT VIC AB 2-0 CT1 TAPERPNT 27 (SUTURE) IMPLANT
SUT VIC AB 2-0 SH 27 (SUTURE) ×1
SUT VIC AB 2-0 SH 27X BRD (SUTURE) ×2 IMPLANT
SUT VIC AB 3-0 SH 27 (SUTURE) ×3
SUT VIC AB 3-0 SH 27X BRD (SUTURE) IMPLANT
SYR BULB IRRIG 60ML STRL (SYRINGE) ×1 IMPLANT
SYR CONTROL 10ML LL (SYRINGE) ×1 IMPLANT
TOWEL OR 17X26 10 PK STRL BLUE (TOWEL DISPOSABLE) ×1 IMPLANT
WATER STERILE IRR 1000ML POUR (IV SOLUTION) ×1 IMPLANT

## 2021-11-09 NOTE — Anesthesia Procedure Notes (Signed)
Anesthesia Regional Block: TAP block   Pre-Anesthetic Checklist: , timeout performed,  Correct Patient, Correct Site, Correct Laterality,  Correct Procedure, Correct Position, site marked,  Risks and benefits discussed,  Surgical consent,  Pre-op evaluation,  At surgeon's request and post-op pain management  Laterality: Left  Prep: chloraprep       Needles:  Injection technique: Single-shot  Needle Type: Echogenic Needle     Needle Length: 9cm  Needle Gauge: 21     Additional Needles:   Narrative:  Start time: 11/09/2021 7:02 AM End time: 11/09/2021 7:10 AM Injection made incrementally with aspirations every 5 mL.  Performed by: Personally  Anesthesiologist: Albertha Ghee, MD  Additional Notes: Pt tolerated the procedure well.

## 2021-11-09 NOTE — Anesthesia Procedure Notes (Signed)
Procedure Name: Intubation Date/Time: 11/09/2021 8:30 AM  Performed by: Reeves Dam, CRNAPre-anesthesia Checklist: Patient identified, Patient being monitored, Timeout performed, Emergency Drugs available and Suction available Patient Re-evaluated:Patient Re-evaluated prior to induction Oxygen Delivery Method: Circle system utilized Preoxygenation: Pre-oxygenation with 100% oxygen Induction Type: IV induction Ventilation: Mask ventilation without difficulty Laryngoscope Size: Mac and 4 Grade View: Grade II Tube type: Oral Tube size: 7.5 mm Number of attempts: 1 Airway Equipment and Method: Stylet Placement Confirmation: ETT inserted through vocal cords under direct vision, positive ETCO2 and breath sounds checked- equal and bilateral Secured at: 21 cm Tube secured with: Tape Dental Injury: Teeth and Oropharynx as per pre-operative assessment

## 2021-11-09 NOTE — Anesthesia Procedure Notes (Signed)
Anesthesia Regional Block: TAP block   Pre-Anesthetic Checklist: , timeout performed,  Correct Patient, Correct Site, Correct Laterality,  Correct Procedure, Correct Position, site marked,  Risks and benefits discussed,  Surgical consent,  Pre-op evaluation,  At surgeon's request and post-op pain management  Laterality: Right  Prep: chloraprep       Needles:  Injection technique: Single-shot  Needle Type: Echogenic Needle     Needle Length: 9cm  Needle Gauge: 21     Additional Needles:   Narrative:  Start time: 11/09/2021 7:10 AM End time: 11/09/2021 7:17 AM Injection made incrementally with aspirations every 5 mL.  Performed by: Personally  Anesthesiologist: Albertha Ghee, MD  Additional Notes: Pt tolerated the procedure well.

## 2021-11-09 NOTE — H&P (Signed)
REFERRING PHYSICIAN: Nance Pear, *  PROVIDER: Landry Corporal, MD  MRN: [id number] DOB: 1950/11/18 Subjective   Chief Complaint: New Consultation (Hernia)   History of Present Illness: Jose Strong is a 71 y.o. male who is seen today as an office consultation for evaluation of New Consultation (Hernia) .   We are asked to see the patient in consultation by Dr. Orpah Cobb to evaluate him for an inguinal hernia. The patient is a 71 year old white male who has had a bulge in the left groin for a number of years. Over the last 6 months or so the bulge has gotten larger. He has had more discomfort associated with it. He also reports a smaller bulge in the right groin and a small bulge at the umbilicus. He denies any nausea or vomiting. He denies any fevers or chills.  Review of Systems: A complete review of systems was obtained from the patient. I have reviewed this information and discussed as appropriate with the patient. See HPI as well for other ROS.  ROS   Medical History: Past Medical History:  Diagnosis Date   History of cancer   Hyperlipidemia   Hypertension   Patient Active Problem List  Diagnosis   Non-recurrent bilateral inguinal hernia without obstruction or gangrene   Umbilical hernia without obstruction or gangrene   Past Surgical History:  Procedure Laterality Date   MOHS SURGERY    Allergies  Allergen Reactions   Amlodipine Rash   Current Outpatient Medications on File Prior to Visit  Medication Sig Dispense Refill   carvediloL (COREG) 12.5 MG tablet TAKE 1 TABLET (12.'5MG'$  TOTAL) BY MOUTH TWICE A DAY WITH MEALS   clobetasoL (TEMOVATE) 0.05 % cream APPLY CREAM TOPICALLY TO AFFECTED AREA TWICE DAILY FOR 2 WEEKS ON, 1 WEEK OFF. REPEAT CYCLE AS NEEDED   dupilumab (DUPIXENT PEN) 200 mg/1.14 mL PnIj Inject subcutaneously   multivitamin capsule Take 1 capsule by mouth once daily   omega-3s-dha-epa-fish oil (FISH OIL) 350-600 mg Cap Take by mouth    tadalafiL (CIALIS) 5 MG tablet TAKE 2 TABLETS BY MOUTH ONCE DAILY AS NEEDED FOR ERECTILE DYSFUNCTION   vitamin E 400 UNIT capsule Take 400 Units by mouth once daily   No current facility-administered medications on file prior to visit.   History reviewed. No pertinent family history.   Social History   Tobacco Use  Smoking Status Former   Types: Cigarettes   Quit date: 1992   Years since quitting: 31.4  Smokeless Tobacco Never    Social History   Socioeconomic History   Marital status: Widowed  Tobacco Use   Smoking status: Former  Types: Cigarettes  Quit date: 1992  Years since quitting: 31.4   Smokeless tobacco: Never  Vaping Use   Vaping Use: Never used  Substance and Sexual Activity   Alcohol use: Yes   Drug use: Never   Objective:   Vitals:  BP: (!) 142/88  Pulse: 74  SpO2: 93%  Weight: 79.7 kg (175 lb 9.6 oz)  Height: 175.3 cm ('5\' 9"'$ )   Body mass index is 25.93 kg/m.  Physical Exam Constitutional:  General: He is not in acute distress. Appearance: Normal appearance.  HENT:  Head: Normocephalic and atraumatic.  Right Ear: External ear normal.  Left Ear: External ear normal.  Nose: Nose normal.  Mouth/Throat:  Mouth: Mucous membranes are moist.  Pharynx: Oropharynx is clear.  Eyes:  General: No scleral icterus. Extraocular Movements: Extraocular movements intact.  Conjunctiva/sclera: Conjunctivae normal.  Pupils: Pupils  are equal, round, and reactive to light.  Cardiovascular:  Rate and Rhythm: Normal rate and regular rhythm.  Pulses: Normal pulses.  Heart sounds: Normal heart sounds.  Pulmonary:  Effort: Pulmonary effort is normal. No respiratory distress.  Breath sounds: Normal breath sounds.  Abdominal:  General: Abdomen is flat. Bowel sounds are normal. There is no distension.  Palpations: Abdomen is soft.  Tenderness: There is no abdominal tenderness.  Genitourinary: Comments: There is a moderate sized bulge in the left groin that  reduces easily. There is a smaller bulge in the right groin that reduces easily. There is also a small bulge at the umbilicus that also reduces easily. Musculoskeletal:  General: No swelling or deformity. Normal range of motion.  Cervical back: Normal range of motion and neck supple. No tenderness.  Skin: General: Skin is warm and dry.  Coloration: Skin is not jaundiced.  Neurological:  General: No focal deficit present.  Mental Status: He is alert and oriented to person, place, and time.  Psychiatric:  Mood and Affect: Mood normal.  Behavior: Behavior normal.     Labs, Imaging and Diagnostic Testing:  Assessment and Plan:   Diagnoses and all orders for this visit:  Non-recurrent bilateral inguinal hernia without obstruction or gangrene  Umbilical hernia without obstruction or gangrene    The patient appears to have reducible bilateral inguinal hernias with the larger being the left side as well as a small reducible umbilical hernia. Because of the risk of incarceration and strangulation I feel he would benefit from having these fixed. He would also like to have this done. I have discussed with him in detail the risks and benefits of the operation to fix the hernias as well as some of the technical aspects including the use of mesh and the risk of chronic pain and he understands and wishes to proceed.

## 2021-11-09 NOTE — Interval H&P Note (Signed)
History and Physical Interval Note:  11/09/2021 7:59 AM  Jose Strong  has presented today for surgery, with the diagnosis of BILATERAL INGUINAL HERNIAS, UMBILCAL HERNIA.  The various methods of treatment have been discussed with the patient and family. After consideration of risks, benefits and other options for treatment, the patient has consented to  Procedure(s) with comments: BILATERAL INGUINAL HERNIA REPAIR WITH MESH (N/A) - GENERAL AND TAP BLOCK HERNIA REPAIR UMBILICAL ADULT WITH MESH (N/A) - GENERAL AND TAP BLOCK as a surgical intervention.  The patient's history has been reviewed, patient examined, no change in status, stable for surgery.  I have reviewed the patient's chart and labs.  Questions were answered to the patient's satisfaction.     [name]

## 2021-11-09 NOTE — Transfer of Care (Signed)
Immediate Anesthesia Transfer of Care Note  Patient: Jose Strong  Procedure(s) Performed: BILATERAL INGUINAL HERNIA REPAIR WITH MESH (Abdomen) HERNIA REPAIR UMBILICAL ADULT WITH MESH (Abdomen)  Patient Location: PACU  Anesthesia Type:General  Level of Consciousness: awake, alert , oriented and patient cooperative  Airway & Oxygen Therapy: Patient Spontanous Breathing  Post-op Assessment: Report given to RN and Post -op Vital signs reviewed and stable  Post vital signs: Reviewed and stable  Last Vitals:  Vitals Value Taken Time  BP 140/88 11/09/21 1146  Temp    Pulse 73 11/09/21 1147  Resp 13 11/09/21 1147  SpO2 92 % 11/09/21 1147  Vitals shown include unvalidated device data.  Last Pain:  Vitals:   11/09/21 0550  TempSrc:   PainSc: 0-No pain         Complications: No notable events documented.

## 2021-11-09 NOTE — Op Note (Addendum)
11/09/2021  11:35 AM  PATIENT:  Jose Strong  71 y.o. male  PRE-OPERATIVE DIAGNOSIS:  BILATERAL INGUINAL HERNIAS, UMBILCAL HERNIA  POST-OPERATIVE DIAGNOSIS:  BILATERAL INGUINAL HERNIAS, UMBILCAL HERNIA  PROCEDURE:  Procedure(s) with comments: BILATERAL INGUINAL HERNIA REPAIR WITH MESH  UMBILICAL HERNIA REPAIR  SURGEON:  Surgeon(s) and Role:    * Jovita Kussmaul, MD - Primary  PHYSICIAN ASSISTANT:   ASSISTANTS: none   ANESTHESIA:   local and general  EBL:  10 mL   BLOOD ADMINISTERED:none  DRAINS: none   LOCAL MEDICATIONS USED:  MARCAINE     SPECIMEN:  Source of Specimen:  lipoma of cord bilaterally  DISPOSITION OF SPECIMEN:  PATHOLOGY  COUNTS:  YES  TOURNIQUET:  * No tourniquets in log *  DICTATION: .Dragon Dictation  After informed consent was obtained the patient was brought to the operating room and placed in the supine position on the operating table.  After adequate induction of general anesthesia the patient's abdomen and bilateral groins were prepped with ChloraPrep, allowed to dry, and draped in usual sterile manner.  An appropriate timeout was performed.  The attention was first turned to the left groin.  The left groin was infiltrated with quarter percent Marcaine.  A small incision was made from the edge of the pubic tubercle towards the anterior superior iliac spine.  The incision was carried through the skin and subcutaneous tissue sharply with the electrocautery until the fascia of the external oblique was encountered.  The external oblique fascia was then opened along its fibers towards the apex of the external ring with a 15 blade knife and Metzenbaum scissors.  A [place] retractor was deployed.  Blunt dissection was carried out of the cord structures until they could be surrounded between 2 fingers.  Half inch Penrose drain was placed around the cord structures for retraction purposes.  The cord was then gently skeletonized by blunt hemostat dissection.  I was  able to identify a large lipoma of the cord as well as a small hernia sac associated with the cord.  Lipoma of the cord was excised sharply with the electrocautery and the base of the cord was clamped with a hemostat, divided, and ligated with 3-0 silk tie.  The hernia sac was fairly broad-based and therefore was reduced without difficulty.  The floor the canal was repaired with interrupted #1 Vicryl stitches.  Next a 3 x 6 piece of ultra Pro mesh was chosen and cut to the appropriate size.  Care was taken during the dissection to avoid any injury to the vas deferens or testicular vessels.  I did look for the ilioinguinal nerve on the left side and did not see it.  The mesh was then sewed inferiorly to the shelving edge of the inguinal ligament with a running 2-0 Prolene stitch.  Tails were cut in the mesh laterally and the tails were wrapped around the cord structures.  Medially and superiorly the mesh was sewed to the muscular aponeurotic strength layer of the transversalis with interrupted 2-0 Prolene vertical mattress stitches.  Lateral to the cord the tails of the mesh were anchored to the shelving edge of the inguinal ligament with interrupted 2-0 Prolene stitches.  Once this was accomplished the mesh appeared to be in good position the hernia seem well repaired.  The wound was irrigated with saline.  The external oblique fascia was then reapproximated with a running 2-0 Vicryl stitch.  The subcutaneous fascia was closed with a running 3-0 Vicryl stitch.  The  skin was closed with a running 4-0 Monocryl subcuticular stitch.  Attention was then turned to the right groin.  The right groin was also infiltrated with quarter percent Marcaine.  A small incision was made from the edge of the pubic tubercle on the right towards the anterior superior iliac spine.  The incision was then carried through the skin and subcutaneous tissue sharply with the electrocautery until the fascia of the external oblique was  encountered.  The external oblique fascia was opened along its fibers towards the apex of the external ring with a 15 blade knife and Metzenbaum scissors.  A [place] retractor was deployed.  Blunt dissection was carried out of the cord structures until they could be surrounded between 2 fingers.  1/2 inch Penrose drain was placed around the cord structures for retraction purposes.  Blunt dissection was carried out of the cord structures by blunt hemostat dissection.  I was able to identify a large lipoma of the cord and a small hernia sac.  The lipoma of the cord was excised sharply with the electrocautery and the base of the lipoma was clamped with a hemostat, divided, and ligated with a 3-0 silk tie.  The hernia sac was fairly broad-based and was therefore reduced easily.  The floor of the canal was then repaired with interrupted #1 Vicryl stitches.  A 3 x 6 piece of ultra Pro mesh was then chosen and cut to the appropriate size.  The mesh was sewed inferiorly to the shelving edge of the inguinal ligament with a running 2-0 Prolene stitch.  Tails were cut in the mesh laterally and the tails were wrapped around the cord structures.  Medially and superiorly the mesh was sewed to the muscular layer aponeurotic strength of the transversalis with interrupted 2-0 Prolene vertical mattress stitches.  Lateral to the cord the tails of the mesh were anchored to the shelving edge of the inguinal ligament with interrupted 2-0 Prolene stitches.  Once this was accomplished the mesh was in good position and the hernia seem well repaired.  The wound was irrigated with copious amounts of saline.  During the dissection the ilioinguinal nerve on the right was identified and involved with some scar tissue.  It was dissected out proximally and distally, clamped, divided, and ligated with 3-0 silk ties.  Next the external oblique fascia was reapproximated with a running 2-0 Vicryl stitch.  The subcutaneous fascia was reapproximated  with a running 3-0 Vicryl stitch.  The skin was then closed with a running 4-0 Monocryl subcuticular stitch.  Attention was then turned to the umbilicus.  A small transversely oriented incision was made at the lower edge of the umbilicus after infiltrating the area with quarter percent Marcaine.  The incision was carried through the skin and subcutaneous tissue sharply with the electrocautery.  There was a small fascial defect noted at the base of the stalk of the umbilicus.  The stalk was opened sharply with the electrocautery.  The hernia sac was then dissected off the stalk of the umbilicus by blunt hemostat dissection and the contents of the hernia was reduced.  The fascial edges were clean and healthy.  The fascial defect was fairly small and measured about 89m.  I did not enter the abdominal cavity.  I elected to primarily closed the fascial defect given the small size of the defect.  I closed the defect with 3 interrupted #1 Novafil stitches.  The stalk of the umbilicus was then tacked back to the fascia with  an interrupted 3-0 Vicryl stitch.  The subcutaneous tissue was closed with interrupted 3-0 Vicryl stitches.  The skin was then closed with a running 4-0 Monocryl subcuticular stitch.  Dermabond dressings were then applied to all the incisions.  The patient tolerated the procedure well.  At the end of the case all needle sponge and instrument counts were correct.  The patient was then awakened and taken to recovery in stable condition.  The patient was noted to have some fullness centrally along his lower abdomen which I believe was a distended bladder at the end of the case.  He may require a Foley catheter in the recovery area. PLAN OF CARE: Discharge to home after PACU  PATIENT DISPOSITION:  PACU - hemodynamically stable.   Delay start of Pharmacological VTE agent (>24hrs) due to surgical blood loss or risk of bleeding: not applicable

## 2021-11-10 ENCOUNTER — Encounter (HOSPITAL_COMMUNITY): Payer: Self-pay | Admitting: General Surgery

## 2021-11-10 LAB — SURGICAL PATHOLOGY

## 2021-11-14 NOTE — Anesthesia Postprocedure Evaluation (Signed)
Anesthesia Post Note  Patient: Jose Strong  Procedure(s) Performed: BILATERAL INGUINAL HERNIA REPAIR WITH MESH (Abdomen) HERNIA REPAIR UMBILICAL ADULT WITH MESH (Abdomen)     Patient location during evaluation: PACU Anesthesia Type: General Level of consciousness: awake and alert Pain management: pain level controlled Vital Signs Assessment: post-procedure vital signs reviewed and stable Respiratory status: spontaneous breathing, nonlabored ventilation, respiratory function stable and patient connected to nasal cannula oxygen Cardiovascular status: blood pressure returned to baseline and stable Postop Assessment: no apparent nausea or vomiting Anesthetic complications: no   No notable events documented.  Last Vitals:  Vitals:   11/09/21 1333 11/09/21 1345  BP: (!) 167/100 (!) 164/96  Pulse: (!) 57 65  Resp: 13 14  Temp:  (!) 36.2 C  SpO2: 96% 91%    Last Pain:  Vitals:   11/09/21 1315  TempSrc:   PainSc: [place]

## 2021-12-12 ENCOUNTER — Encounter: Payer: Self-pay | Admitting: Family

## 2021-12-15 ENCOUNTER — Encounter: Payer: Self-pay | Admitting: Family

## 2021-12-15 ENCOUNTER — Ambulatory Visit (INDEPENDENT_AMBULATORY_CARE_PROVIDER_SITE_OTHER): Payer: Medicare HMO | Admitting: Family

## 2021-12-15 VITALS — BP 138/88 | HR 70 | Temp 98.1°F | Resp 18 | Ht 70.0 in | Wt 176.2 lb

## 2021-12-15 DIAGNOSIS — C439 Malignant melanoma of skin, unspecified: Secondary | ICD-10-CM

## 2021-12-15 DIAGNOSIS — Z0000 Encounter for general adult medical examination without abnormal findings: Secondary | ICD-10-CM | POA: Diagnosis not present

## 2021-12-15 DIAGNOSIS — I10 Essential (primary) hypertension: Secondary | ICD-10-CM | POA: Diagnosis not present

## 2021-12-15 DIAGNOSIS — R972 Elevated prostate specific antigen [PSA]: Secondary | ICD-10-CM

## 2021-12-15 DIAGNOSIS — E785 Hyperlipidemia, unspecified: Secondary | ICD-10-CM | POA: Diagnosis not present

## 2021-12-15 DIAGNOSIS — Z23 Encounter for immunization: Secondary | ICD-10-CM

## 2021-12-15 LAB — COMPREHENSIVE METABOLIC PANEL
ALT: 24 U/L (ref 0–53)
AST: 19 U/L (ref 0–37)
Albumin: 4.3 g/dL (ref 3.5–5.2)
Alkaline Phosphatase: 57 U/L (ref 39–117)
BUN: 12 mg/dL (ref 6–23)
CO2: 30 mEq/L (ref 19–32)
Calcium: 9.5 mg/dL (ref 8.4–10.5)
Chloride: 103 mEq/L (ref 96–112)
Creatinine, Ser: 0.77 mg/dL (ref 0.40–1.50)
GFR: 91.53 mL/min (ref >60.00)
Glucose, Bld: 114 mg/dL — ABNORMAL HIGH (ref 70–99)
Potassium: 4.3 mEq/L (ref 3.5–5.1)
Sodium: 140 mEq/L (ref 135–145)
Total Bilirubin: 0.6 mg/dL (ref 0.2–1.2)
Total Protein: 6.8 g/dL (ref 6.0–8.3)

## 2021-12-15 LAB — LIPID PANEL
Cholesterol: 215 mg/dL — ABNORMAL HIGH (ref 0–200)
HDL: 43.8 mg/dL (ref >39.00)
LDL Cholesterol: 140 mg/dL — ABNORMAL HIGH (ref 0–99)
NonHDL: 171.18
Total CHOL/HDL Ratio: 5
Triglycerides: 155 mg/dL — ABNORMAL HIGH (ref 0.0–149.0)
VLDL: 31 mg/dL (ref 0.0–40.0)

## 2021-12-15 NOTE — Assessment & Plan Note (Addendum)
Has follow up in December with dermatology.

## 2021-12-15 NOTE — Patient Instructions (Signed)
Please schedule routine vision and dental.

## 2021-12-15 NOTE — Assessment & Plan Note (Signed)
BP Readings from Last 3 Encounters:  12/15/21 138/88  11/09/21 (!) 164/96  11/06/21 (!) 141/103   BP looks good. Continue carvedilol.

## 2021-12-15 NOTE — Assessment & Plan Note (Signed)
Not on statin.   Wt Readings from Last 3 Encounters:  12/15/21 176 lb 3.2 oz (79.9 kg)  11/09/21 172 lb (78 kg)  11/06/21 175 lb 3.2 oz (79.5 kg)   Lab Results  Component Value Date   CHOL 205 (H) 12/06/2020   HDL 53.10 12/06/2020   LDLCALC 115 (H) 12/06/2020   LDLDIRECT 94.0 04/25/2017   TRIG 187.0 (H) 12/06/2020   CHOLHDL 4 12/06/2020   Has not been doing well with diet. Will repeat today.

## 2021-12-15 NOTE — Assessment & Plan Note (Signed)
Will follow back up with urology this November.

## 2021-12-15 NOTE — Assessment & Plan Note (Signed)
Continue to work on healthy diet and exercise when cleared by Psychologist, sport and exercise.  PSA being monitored by urology. cologuard up to date. Flu shot today. Recommended covid booster at the pharmacy.

## 2021-12-15 NOTE — Progress Notes (Signed)
Subjective:   By signing my name below, I, [name], attest that this documentation has been prepared under the direction and in the presence of Karie Chimera, NP 12/15/2021    Patient ID: Jose Strong, male    DOB: January 02, 1951, 71 y.o.   MRN: [id number]  Chief Complaint  Patient presents with   Annual Exam    Pt states not fasting     HPI Patient is in today for a comprehensive physical exam.  Surgery: He used to be in pain after recent hernia surgery, but now reports to be doing much better.  Blood pressure: Blood pressure is normal this visit. BP Readings from Last 3 Encounters:  12/15/21 138/88  11/09/21 (!) 164/96  11/06/21 (!) 141/103   Pulse Readings from Last 3 Encounters:  12/15/21 70  11/09/21 65  11/06/21 63   Weight: Patient is at a healthy weight. Wt Readings from Last 3 Encounters:  12/15/21 176 lb 3.2 oz (79.9 kg)  11/09/21 172 lb (78 kg)  11/06/21 175 lb 3.2 oz (79.5 kg)   Genitourinary: He regularly sees his urologist.   Skin: Patient regularly sees his dermatologist.    Hearing: He reports of have some hearing problems  He denies having any fever, vision symptoms, new muscle pain, joint pain , new moles, rashes, congestion, sinus pain, sore throat, palpations, cough, SOB ,wheezing,n/v/d constipation, blood in stool, dysuria, frequency, hematuria, depression, anxiety, headaches at this time  Social history: He recently had a hernia surgery. No new family medical history. Colonoscopy: last completed on 05/23/2021 PSA: last completed on 12/06/2020   Immunizations: He is interested in receiving influenza vaccine this visit. Exercise: He has not been exercising regularly since hernia surgery Dental: He is not UTD on dental care Vision: He is not UTD on vision care   Health Maintenance Due  Topic Date Due   Zoster Vaccines- Shingrix (1 of 2) Never done   COVID-19 Vaccine (4 - Pfizer risk series) 04/05/2020    Past Medical History:   Diagnosis Date   Cancer ([place]) 2011   melanoma --neck   Essential hypertension 05/20/2018   Malignant melanoma ([place]) 05/19/2020   stage 1 A, removed by Dr. Orbie Hurst and refered to [facility] for wide excision   Melanoma ([place]) 2022    Past Surgical History:  Procedure Laterality Date   INGUINAL HERNIA REPAIR N/A 11/09/2021   Procedure: BILATERAL INGUINAL HERNIA REPAIR WITH MESH;  Surgeon: Jovita Kussmaul, MD;  Location: [facility];  Service: General;  Laterality: N/A;  GENERAL AND TAP BLOCK   MOHS SURGERY  03/12/2009   MOHS SURGERY Right 07/2020   non-invasive melanoma   TONSILLECTOMY  03/12/1962   TONSILLECTOMY     UMBILICAL HERNIA REPAIR N/A 11/09/2021   Procedure: HERNIA REPAIR UMBILICAL ADULT WITH MESH;  Surgeon: Jovita Kussmaul, MD;  Location: [facility] OR;  Service: General;  Laterality: N/A;  GENERAL AND TAP BLOCK    Family History  Problem Relation Age of Onset   COPD Mother    Angina Mother     Social History   Socioeconomic History   Marital status: Widowed    Spouse name: Not on file   Number of children: 0   Years of education: Not on file   Highest education level: Not on file  Occupational History   Not on file  Tobacco Use   Smoking status: Former    Packs/day: 0.20    Types: Cigarettes    Quit date: 25  Years since quitting: 31.7   Smokeless tobacco: Never  Vaping Use   Vaping Use: Never used  Substance and Sexual Activity   Alcohol use: Not Currently    Alcohol/week: 32.0 standard drinks of alcohol    Types: 32 Cans of beer per week   Drug use: Never   Sexual activity: Yes    Partners: Female  Other Topics Concern   Not on file  Social History Narrative   Wife died May 16, 2014, had cancer   Unemployed    Completed HS   Not working   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     He does not have children   Enjoys fishing/golf, recently caring for his wife.     Social Determinants of Health   Financial Resource Strain: Low Risk  (08/02/2021)   Overall  Financial Resource Strain (CARDIA)    Difficulty of Paying Living Expenses: Not hard at all  Food Insecurity: No Food Insecurity (08/02/2021)   Hunger Vital Sign    Worried About Running Out of Food in the Last Year: Never true    Ran Out of Food in the Last Year: Never true  Transportation Needs: No Transportation Needs (08/02/2021)   PRAPARE - Hydrologist (Medical): No    Lack of Transportation (Non-Medical): No  Physical Activity: Sufficiently Active (08/02/2021)   Exercise Vital Sign    Days of Exercise per Week: 4 days    Minutes of Exercise per Session: 40 min  Stress: No Stress Concern Present (08/02/2021)   [place]    Feeling of Stress : Not at all  Social Connections: Moderately Isolated (08/02/2021)   Social Connection and Isolation Panel [NHANES]    Frequency of Communication with Friends and Family: More than three times a week    Frequency of Social Gatherings with Friends and Family: More than three times a week    Attends Religious Services: 1 to 4 times per year    Active Member of [employer] or Organizations: No    Attends Archivist Meetings: Never    Marital Status: Widowed  Intimate Partner Violence: Not At Risk (08/02/2021)   Humiliation, Afraid, Rape, and Kick questionnaire    Fear of Current or Ex-Partner: No    Emotionally Abused: No    Physically Abused: No    Sexually Abused: No    Outpatient Medications Prior to Visit  Medication Sig Dispense Refill   acetaminophen (TYLENOL) 500 MG tablet Take 500 mg by mouth every 6 (six) hours as needed for moderate pain.     Calcium-Magnesium-Zinc 167-83-8 MG TABS Take 1 tablet by mouth daily.     carvedilol (COREG) 12.5 MG tablet TAKE 1 TABLET (12.5MG TOTAL) BY MOUTH TWICE A DAY WITH MEALS 180 tablet 1   clotrimazole-betamethasone (LOTRISONE) cream Apply 1 application topically 2 (two) times daily. (Patient taking  differently: Apply 1 application  topically 2 (two) times daily as needed (eczema).) 45 g 1   dupilumab (DUPIXENT) 200 MG/1.14ML prefilled syringe Inject 200 mg into the skin every 14 (fourteen) days.     Garlic (GARLIQUE PO) Take 1 capsule by mouth daily.     loratadine (CLARITIN) 10 MG tablet Take 10 mg by mouth daily.     Multiple Vitamin (MULTIVITAMIN) tablet Take 1 tablet by mouth daily.     Omega-3 Fatty Acids (FISH OIL) 1000 MG CPDR 3000 mg by mouth twice daily.     oxyCODONE (ROXICODONE) 5  MG immediate release tablet Take 1 tablet (5 mg total) by mouth every 6 (six) hours as needed for severe pain. 15 tablet 0   tadalafil (CIALIS) 20 MG tablet Take 0.5-1 tablets (10-20 mg total) by mouth every other day as needed for erectile dysfunction. 10 tablet 11   vitamin E 180 MG (400 UNITS) capsule Take 400 Units by mouth daily.     tadalafil (CIALIS) 10 MG tablet Take 20 mg by mouth daily as needed for erectile dysfunction.     No facility-administered medications prior to visit.    Allergies  Allergen Reactions   Advil [Ibuprofen] Other (See Comments)    Upset Stomach   Amlodipine Rash    Review of Systems  Constitutional:  Negative for fever.  HENT:  Positive for hearing loss. Negative for congestion, sinus pain and sore throat.   Respiratory:  Negative for cough, shortness of breath and wheezing.   Cardiovascular:  Negative for palpitations.  Gastrointestinal:  Negative for blood in stool, constipation, diarrhea, nausea and vomiting.  Genitourinary:  Negative for dysuria, frequency and hematuria.  Musculoskeletal:  Negative for joint pain and myalgias.  Skin:  Negative for rash.       (-) new moles  Neurological:  Negative for headaches.  Psychiatric/Behavioral:  Negative for depression. The patient is not nervous/anxious.        Objective:    Physical Exam Constitutional:      General: He is not in acute distress.    Appearance: Normal appearance. He is not ill-appearing.   HENT:     Head: Normocephalic and atraumatic.     Right Ear: Tympanic membrane, ear canal and external ear normal.     Left Ear: Tympanic membrane, ear canal and external ear normal.  Eyes:     Extraocular Movements: Extraocular movements intact.     Pupils: Pupils are equal, round, and reactive to light.  Cardiovascular:     Rate and Rhythm: Normal rate and regular rhythm.     Heart sounds: Normal heart sounds. No murmur heard.    No gallop.  Pulmonary:     Effort: Pulmonary effort is normal. No respiratory distress.     Breath sounds: Normal breath sounds. No wheezing or rales.  Abdominal:     General: Bowel sounds are normal. There is no distension.     Palpations: Abdomen is soft.     Tenderness: There is no abdominal tenderness. There is no guarding.  Musculoskeletal:     Comments: 5/5 strength upper and lower extremities    Skin:    General: Skin is warm and dry.     Findings: Abrasion (lateral left hand) present.  Neurological:     Mental Status: He is alert and oriented to person, place, and time.     Deep Tendon Reflexes:     Reflex Scores:      Patellar reflexes are 2+ on the right side and 2+ on the left side. Psychiatric:        Mood and Affect: Mood normal.        Behavior: Behavior normal.        Judgment: Judgment normal.     BP 138/88 (BP Location: Right Arm, Patient Position: Sitting, Cuff Size: Normal)   Pulse 70   Temp 98.1 F (36.7 C) (Oral)   Resp 18   Ht '5\' 10"'  (1.778 m)   Wt 176 lb 3.2 oz (79.9 kg)   SpO2 97%   BMI 25.28 kg/m  Wt Readings  from Last 3 Encounters:  12/15/21 176 lb 3.2 oz (79.9 kg)  11/09/21 172 lb (78 kg)  11/06/21 175 lb 3.2 oz (79.5 kg)       Assessment & Plan:   Problem List Items Addressed This Visit       Unprioritized   Routine general medical examination at a health care facility    Continue to work on healthy diet and exercise when cleared by Psychologist, sport and exercise.  PSA being monitored by urology. cologuard up to date. Flu  shot today. Recommended covid booster at the pharmacy.       Malignant melanoma ([place])    Has follow up in December with dermatology.      Hyperlipidemia    Not on statin.   Wt Readings from Last 3 Encounters:  12/15/21 176 lb 3.2 oz (79.9 kg)  11/09/21 172 lb (78 kg)  11/06/21 175 lb 3.2 oz (79.5 kg)   Lab Results  Component Value Date   CHOL 205 (H) 12/06/2020   HDL 53.10 12/06/2020   LDLCALC 115 (H) 12/06/2020   LDLDIRECT 94.0 04/25/2017   TRIG 187.0 (H) 12/06/2020   CHOLHDL 4 12/06/2020  Has not been doing well with diet. Will repeat today.       Relevant Orders   Comp Met (CMET)   Lipid panel   Essential hypertension    BP Readings from Last 3 Encounters:  12/15/21 138/88  11/09/21 (!) 164/96  11/06/21 (!) 141/103  BP looks good. Continue carvedilol.       Elevated PSA    Will follow back up with urology this November.       Other Visit Diagnoses     Flu vaccine need    -  Primary   Relevant Orders   Flu Vaccine QUAD High Dose(Fluad) (Completed)        No orders of the defined types were placed in this encounter.   I, Nance Pear, NP, personally preformed the services described in this documentation.  All medical record entries made by the scribe were at my direction and in my presence.  I have reviewed the chart and discharge instructions (if applicable) and agree that the record reflects my personal performance and is accurate and complete. 12/15/2021   I,Amber Collins,acting as a scribe for Nance Pear, NP.,have documented all relevant documentation on the behalf of Nance Pear, NP,as directed by  Nance Pear, NP while in the presence of Nance Pear, NP.      Nance Pear, NP

## 2022-01-08 DIAGNOSIS — M5417 Radiculopathy, lumbosacral region: Secondary | ICD-10-CM | POA: Diagnosis not present

## 2022-01-08 DIAGNOSIS — M9901 Segmental and somatic dysfunction of cervical region: Secondary | ICD-10-CM | POA: Diagnosis not present

## 2022-01-08 DIAGNOSIS — M5413 Radiculopathy, cervicothoracic region: Secondary | ICD-10-CM | POA: Diagnosis not present

## 2022-01-08 DIAGNOSIS — M9903 Segmental and somatic dysfunction of lumbar region: Secondary | ICD-10-CM | POA: Diagnosis not present

## 2022-01-08 DIAGNOSIS — M62830 Muscle spasm of back: Secondary | ICD-10-CM | POA: Diagnosis not present

## 2022-01-10 DIAGNOSIS — M9901 Segmental and somatic dysfunction of cervical region: Secondary | ICD-10-CM | POA: Diagnosis not present

## 2022-01-10 DIAGNOSIS — M5417 Radiculopathy, lumbosacral region: Secondary | ICD-10-CM | POA: Diagnosis not present

## 2022-01-10 DIAGNOSIS — M5413 Radiculopathy, cervicothoracic region: Secondary | ICD-10-CM | POA: Diagnosis not present

## 2022-01-10 DIAGNOSIS — M62830 Muscle spasm of back: Secondary | ICD-10-CM | POA: Diagnosis not present

## 2022-01-10 DIAGNOSIS — M9903 Segmental and somatic dysfunction of lumbar region: Secondary | ICD-10-CM | POA: Diagnosis not present

## 2022-01-11 DIAGNOSIS — R972 Elevated prostate specific antigen [PSA]: Secondary | ICD-10-CM | POA: Diagnosis not present

## 2022-01-15 DIAGNOSIS — K4020 Bilateral inguinal hernia, without obstruction or gangrene, not specified as recurrent: Secondary | ICD-10-CM | POA: Diagnosis not present

## 2022-01-15 DIAGNOSIS — R972 Elevated prostate specific antigen [PSA]: Secondary | ICD-10-CM | POA: Diagnosis not present

## 2022-01-22 ENCOUNTER — Other Ambulatory Visit (HOSPITAL_BASED_OUTPATIENT_CLINIC_OR_DEPARTMENT_OTHER): Payer: Self-pay

## 2022-01-22 MED ORDER — COMIRNATY 30 MCG/0.3ML IM SUSY
PREFILLED_SYRINGE | INTRAMUSCULAR | 0 refills | Status: DC
  Filled 2022-01-22: qty 0.3, 1d supply, fill #0

## 2022-01-26 DIAGNOSIS — M9903 Segmental and somatic dysfunction of lumbar region: Secondary | ICD-10-CM | POA: Diagnosis not present

## 2022-01-26 DIAGNOSIS — M9901 Segmental and somatic dysfunction of cervical region: Secondary | ICD-10-CM | POA: Diagnosis not present

## 2022-01-26 DIAGNOSIS — M62830 Muscle spasm of back: Secondary | ICD-10-CM | POA: Diagnosis not present

## 2022-01-26 DIAGNOSIS — M5417 Radiculopathy, lumbosacral region: Secondary | ICD-10-CM | POA: Diagnosis not present

## 2022-01-26 DIAGNOSIS — M5413 Radiculopathy, cervicothoracic region: Secondary | ICD-10-CM | POA: Diagnosis not present

## 2022-02-20 DIAGNOSIS — L570 Actinic keratosis: Secondary | ICD-10-CM | POA: Diagnosis not present

## 2022-02-20 DIAGNOSIS — C44529 Squamous cell carcinoma of skin of other part of trunk: Secondary | ICD-10-CM | POA: Diagnosis not present

## 2022-02-20 DIAGNOSIS — C44612 Basal cell carcinoma of skin of right upper limb, including shoulder: Secondary | ICD-10-CM | POA: Diagnosis not present

## 2022-02-20 DIAGNOSIS — D485 Neoplasm of uncertain behavior of skin: Secondary | ICD-10-CM | POA: Diagnosis not present

## 2022-02-20 DIAGNOSIS — L2089 Other atopic dermatitis: Secondary | ICD-10-CM | POA: Diagnosis not present

## 2022-02-20 DIAGNOSIS — Z85820 Personal history of malignant melanoma of skin: Secondary | ICD-10-CM | POA: Diagnosis not present

## 2022-02-23 ENCOUNTER — Encounter (INDEPENDENT_AMBULATORY_CARE_PROVIDER_SITE_OTHER): Payer: Medicare HMO | Admitting: Family

## 2022-02-23 DIAGNOSIS — U071 COVID-19: Secondary | ICD-10-CM

## 2022-02-25 DIAGNOSIS — U071 COVID-19: Secondary | ICD-10-CM | POA: Insufficient documentation

## 2022-02-25 HISTORY — DX: COVID-19: U07.1

## 2022-02-25 MED ORDER — NIRMATRELVIR/RITONAVIR (PAXLOVID)TABLET: 3.0000 | ORAL_TABLET | Freq: Two times a day (BID) | ORAL | 0 refills | Status: AC

## 2022-02-25 NOTE — Telephone Encounter (Signed)
Please see the MyChart message reply(ies) for my assessment and plan.  The patient gave consent for this Medical Advice Message and is aware that it may result in a bill to their insurance company as well as the possibility that this may result in a co-payment or deductible. They are an established patient, but are not seeking medical advice exclusively about a problem treated during an in person or video visit in the last 7 days. I did not recommend an in person or video visit within 7 days of my reply.  I spent a total of 10 provider minutes cumulative time within 7 days through [place], NP

## 2022-03-30 ENCOUNTER — Other Ambulatory Visit: Payer: Self-pay | Admitting: Family

## 2022-06-18 NOTE — Progress Notes (Signed)
Subjective:   By signing my name below, I, Jose Strong, attest that this documentation has been prepared under the direction and in the presence of Lemont FillersMelissa S O'Sullivan, NP 06/19/22   Patient ID: Jose Strong, male    DOB: 02-Dec-1950, 72 y.o.   MRN: [id number]  Chief Complaint  Patient presents with   Hypertension    Here for follow up    HPI Patient is in today for a follow up.   Hypertension: His blood pressure is elevated during this visit. He does not monitor his blood pressure at home. He recently bought a new monitor due to the previous machine not working. He is compliant with 12.5 mg Carvedilol. He tends to enjoy white chocolate. He has not been staying active but recently mowed the lawn.   BP Readings from Last 3 Encounters:  06/19/22 128/85  12/15/21 138/88  11/09/21 (!) 164/96   Elevated PSA: He has been following up with urology. He states his PSA values have been decreasing.  He is compliant with 20 mg Cialis.  Lab Results  Component Value Date   PSA 4.44 (H) 12/06/2020   PSA 3.24 11/18/2018   PSA 3.04 12/31/2016    Past Medical History:  Diagnosis Date   Cancer 2011   melanoma --neck   COVID-19 virus infection 02/25/2022   Essential hypertension 05/20/2018   Malignant melanoma 05/19/2020   stage 1 A, removed by Dr. Reginia NaasEdward smith and refered to Selina CooleyKyle Flores for wide excision   Melanoma 2022    Past Surgical History:  Procedure Laterality Date   INGUINAL HERNIA REPAIR N/A 11/09/2021   Procedure: BILATERAL INGUINAL HERNIA REPAIR WITH MESH;  Surgeon: Griselda Mineroth, Paul III, MD;  [facility];  Service: General;  Laterality: N/A;  GENERAL AND TAP BLOCK   MOHS SURGERY  03/12/2009   MOHS SURGERY Right 07/2020   non-invasive melanoma   TONSILLECTOMY  03/12/1962   TONSILLECTOMY     UMBILICAL HERNIA REPAIR N/A 11/09/2021   Procedure: HERNIA REPAIR UMBILICAL ADULT WITH MESH;  Surgeon: Griselda Mineroth, Paul III, MD;  [facility];  Service: General;  Laterality: N/A;   GENERAL AND TAP BLOCK    Family History  Problem Relation Age of Onset   COPD Mother    Angina Mother     Social History   Socioeconomic History   Marital status: Widowed    Spouse name: Not on file   Number of children: 0   Years of education: Not on file   Highest education level: 12th grade  Occupational History   Not on file  Tobacco Use   Smoking status: Former    Packs/day: .2    Types: Cigarettes    Quit date: 111992    Years since quitting: 32.2   Smokeless tobacco: Never  Vaping Use   Vaping Use: Never used  Substance and Sexual Activity   Alcohol use: Not Currently    Alcohol/week: 32.0 standard drinks of alcohol    Types: 32 Cans of beer per week   Drug use: Never   Sexual activity: Yes    Partners: Female  Other Topics Concern   Not on file  Social History Narrative   Wife died 2016, had cancer   Unemployed    Completed HS   Not working   Has worked in Therapist, sportsproperty management.     He does not have children   Enjoys fishing/golf, recently caring for his wife.     Social Determinants of Health   Financial  Resource Strain: Low Risk  (06/13/2022)   Overall Financial Resource Strain (CARDIA)    Difficulty of Paying Living Expenses: Not very hard  Food Insecurity: Food Insecurity Present (06/13/2022)   Hunger Vital Sign    Worried About Running Out of Food in the Last Year: Sometimes true    Ran Out of Food in the Last Year: Never true  Transportation Needs: No Transportation Needs (06/13/2022)   PRAPARE - Administrator, Civil Service (Medical): No    Lack of Transportation (Non-Medical): No  Physical Activity: Sufficiently Active (06/13/2022)   Exercise Vital Sign    Days of Exercise per Week: 3 days    Minutes of Exercise per Session: 50 min  Stress: No Stress Concern Present (06/13/2022)   [employer] of Occupational Health - Occupational Stress Questionnaire    Feeling of Stress : Not at all  Social Connections: Moderately Isolated  (06/13/2022)   Social Connection and Isolation Panel [NHANES]    Frequency of Communication with Friends and Family: More than three times a week    Frequency of Social Gatherings with Friends and Family: Three times a week    Attends Religious Services: More than 4 times per year    Active Member of Clubs or Organizations: No    Attends Banker Meetings: Never    Marital Status: Widowed  Intimate Partner Violence: Not At Risk (08/02/2021)   Humiliation, Afraid, Rape, and Kick questionnaire    Fear of Current or Ex-Partner: No    Emotionally Abused: No    Physically Abused: No    Sexually Abused: No    Outpatient Medications Prior to Visit  Medication Sig Dispense Refill   acetaminophen (TYLENOL) 500 MG tablet Take 500 mg by mouth every 6 (six) hours as needed for moderate pain.     Calcium-Magnesium-Zinc 167-83-8 MG TABS Take 1 tablet by mouth daily.     carvedilol (COREG) 12.5 MG tablet TAKE 1 TABLET (12.5MG  TOTAL) BY MOUTH TWICE A DAY WITH MEALS 180 tablet 1   clotrimazole-betamethasone (LOTRISONE) cream Apply 1 application topically 2 (two) times daily. (Patient taking differently: Apply 1 application  topically 2 (two) times daily as needed (eczema).) 45 g 1   dupilumab (DUPIXENT) 200 MG/1. prefilled syringe Inject 200 mg into the skin every 14 (fourteen) days.     Garlic (GARLIQUE PO) Take 1 capsule by mouth daily.     Multiple Vitamin (MULTIVITAMIN) tablet Take 1 tablet by mouth daily.     Omega-3 Fatty Acids (FISH OIL) 1000 MG CPDR 3000 mg by mouth twice daily.     vitamin E 180 MG (400 UNITS) capsule Take 400 Units by mouth daily.     loratadine (CLARITIN) 10 MG tablet Take 10 mg by mouth daily.     tadalafil (CIALIS) 20 MG tablet Take 0.5-1 tablets (10-20 mg total) by mouth every other day as needed for erectile dysfunction. 10 tablet 11   COVID-19 mRNA vaccine 2023-2024 (COMIRNATY) syringe Inject into the muscle. 0.3 mL 0   oxyCODONE (ROXICODONE) 5 MG immediate  release tablet Take 1 tablet (5 mg total) by mouth every 6 (six) hours as needed for severe pain. 15 tablet 0   No facility-administered medications prior to visit.    Allergies  Allergen Reactions   Advil [Ibuprofen] Other (See Comments)    Upset Stomach   Amlodipine Rash    ROS See HPI    Objective:    Physical Exam Constitutional:  General: He is awake. He is not in acute distress.    Appearance: He is well-developed.  HENT:     Head: Normocephalic and atraumatic.  Cardiovascular:     Rate and Rhythm: Normal rate and regular rhythm.     Heart sounds: No murmur heard. Pulmonary:     Effort: Pulmonary effort is normal. No respiratory distress.     Breath sounds: Normal breath sounds. No wheezing or rales.  Skin:    General: Skin is warm and dry.  Neurological:     Mental Status: He is alert and oriented to person, place, and time.     Cranial Nerves: No facial asymmetry.  Psychiatric:        Mood and Affect: Mood normal.        Speech: Speech normal.        Behavior: Behavior normal.        Thought Content: Thought content normal.     BP 128/85 (BP Location: Left Arm, Patient Position: Sitting)   Pulse 70   Temp 97.8 F (36.6 C) (Oral)   Resp 16   Wt 178 lb (80.7 kg)   SpO2 100%   BMI 25.54 kg/m  Wt Readings from Last 3 Encounters:  06/19/22 178 lb (80.7 kg)  12/15/21 176 lb 3.2 oz (79.9 kg)  11/09/21 172 lb (78 kg)       Assessment & Plan:  Malignant melanoma, unspecified site Assessment & Plan: He is following regularly with dermatology.  He was treated with dupixent for his eczema recently and notes resolution of the rash on his palms.    Hyperlipidemia, unspecified hyperlipidemia type Assessment & Plan: Lab Results  Component Value Date   CHOL 215 (H) 12/15/2021   HDL 43.80 12/15/2021   LDLCALC 140 (H) 12/15/2021   LDLDIRECT 94.0 04/25/2017   TRIG 155.0 (H) 12/15/2021   CHOLHDL 5 12/15/2021   Continue to work on diet and continue  fish oil.    Hyperglycemia Assessment & Plan: Lab Results  Component Value Date   HGBA1C 5.4 01/02/2017   Will repeat A1C.   Orders: -     Hemoglobin A1c  Elevated PSA Assessment & Plan: He is following with urology and reports that his PSA has been trending downward.     Essential hypertension -     Comprehensive metabolic panel  Erectile dysfunction, unspecified erectile dysfunction type Assessment & Plan: Fair response to Cialis 20mg . Continue same.   Other orders -     Tadalafil; Take 0.5-1 tablets (10-20 mg total) by mouth every other day as needed for erectile dysfunction.  Dispense: 10 tablet; Refill: 11 -     Loratadine; Take 1 tablet (10 mg total) by mouth daily.  Dispense: 90 tablet; Refill: 4     I,Rachel Strong,acting as a scribe for Lemont Fillers, NP.,have documented all relevant documentation on the behalf of Lemont Fillers, NP,as directed by  Lemont Fillers, NP while in the presence of Lemont Fillers, NP.   I, Lemont Fillers, NP, personally preformed the services described in this documentation.  All medical record entries made by the scribe were at my direction and in my presence.  I have reviewed the chart and discharge instructions (if applicable) and agree that the record reflects my personal performance and is accurate and complete. 06/19/22   Lemont Fillers, NP

## 2022-06-19 ENCOUNTER — Ambulatory Visit (INDEPENDENT_AMBULATORY_CARE_PROVIDER_SITE_OTHER): Payer: Medicare HMO | Admitting: Family

## 2022-06-19 ENCOUNTER — Encounter: Payer: Self-pay | Admitting: Family

## 2022-06-19 VITALS — BP 128/85 | HR 70 | Temp 97.8°F | Resp 16 | Wt 178.0 lb

## 2022-06-19 DIAGNOSIS — R739 Hyperglycemia, unspecified: Secondary | ICD-10-CM

## 2022-06-19 DIAGNOSIS — N529 Male erectile dysfunction, unspecified: Secondary | ICD-10-CM

## 2022-06-19 DIAGNOSIS — C439 Malignant melanoma of skin, unspecified: Secondary | ICD-10-CM

## 2022-06-19 DIAGNOSIS — E785 Hyperlipidemia, unspecified: Secondary | ICD-10-CM

## 2022-06-19 DIAGNOSIS — R972 Elevated prostate specific antigen [PSA]: Secondary | ICD-10-CM

## 2022-06-19 DIAGNOSIS — I10 Essential (primary) hypertension: Secondary | ICD-10-CM

## 2022-06-19 LAB — COMPREHENSIVE METABOLIC PANEL
ALT: 18 U/L (ref 0–53)
AST: 18 U/L (ref 0–37)
Albumin: 4.2 g/dL (ref 3.5–5.2)
Alkaline Phosphatase: 56 U/L (ref 39–117)
BUN: 13 mg/dL (ref 6–23)
CO2: 26 mEq/L (ref 19–32)
Calcium: 9.4 mg/dL (ref 8.4–10.5)
Chloride: 102 mEq/L (ref 96–112)
Creatinine, Ser: 0.76 mg/dL (ref 0.40–1.50)
GFR: 91.56 mL/min (ref >60.00)
Glucose, Bld: 102 mg/dL — ABNORMAL HIGH (ref 70–99)
Potassium: 4.3 mEq/L (ref 3.5–5.1)
Sodium: 137 mEq/L (ref 135–145)
Total Bilirubin: 0.7 mg/dL (ref 0.2–1.2)
Total Protein: 6.5 g/dL (ref 6.0–8.3)

## 2022-06-19 LAB — HEMOGLOBIN A1C: Hgb A1c MFr Bld: 5.2 % (ref 4.6–6.5)

## 2022-06-19 MED ORDER — LORATADINE 10 MG PO TABS: 10.0000 mg | ORAL_TABLET | Freq: Every day | ORAL | 4 refills | Status: AC

## 2022-06-19 MED ORDER — TADALAFIL 20 MG PO TABS: 10.0000 mg | ORAL_TABLET | ORAL | 11 refills | Status: DC | PRN

## 2022-06-19 NOTE — Assessment & Plan Note (Addendum)
Lab Results  Component Value Date   HGBA1C 5.4 01/02/2017   Will repeat A1C.

## 2022-06-19 NOTE — Assessment & Plan Note (Addendum)
He is following regularly with dermatology.  He was treated with dupixent for his eczema recently and notes resolution of the rash on his palms.

## 2022-06-19 NOTE — Assessment & Plan Note (Addendum)
Fair response to Cialis 20mg . Continue same.

## 2022-06-19 NOTE — Assessment & Plan Note (Signed)
Lab Results  Component Value Date   CHOL 215 (H) 12/15/2021   HDL 43.80 12/15/2021   LDLCALC 140 (H) 12/15/2021   LDLDIRECT 94.0 04/25/2017   TRIG 155.0 (H) 12/15/2021   CHOLHDL 5 12/15/2021   Continue to work on diet and continue fish oil.

## 2022-06-19 NOTE — Assessment & Plan Note (Signed)
He is following with urology and reports that his PSA has been trending downward.

## 2022-07-11 DIAGNOSIS — R972 Elevated prostate specific antigen [PSA]: Secondary | ICD-10-CM | POA: Diagnosis not present

## 2022-07-18 ENCOUNTER — Telehealth: Payer: Self-pay | Admitting: Family

## 2022-07-18 DIAGNOSIS — N400 Enlarged prostate without lower urinary tract symptoms: Secondary | ICD-10-CM | POA: Diagnosis not present

## 2022-07-18 DIAGNOSIS — R972 Elevated prostate specific antigen [PSA]: Secondary | ICD-10-CM | POA: Diagnosis not present

## 2022-07-18 NOTE — Telephone Encounter (Signed)
Contacted Jose Strong to schedule their annual wellness visit. Appointment made for 08/08/2022.  Verlee Rossetti; Care Guide Ambulatory Clinical Support [place] [facility] Direct Dial: [phone]

## 2022-08-08 ENCOUNTER — Ambulatory Visit (INDEPENDENT_AMBULATORY_CARE_PROVIDER_SITE_OTHER): Payer: Medicare HMO | Admitting: *Deleted

## 2022-08-08 DIAGNOSIS — M62830 Muscle spasm of back: Secondary | ICD-10-CM | POA: Diagnosis not present

## 2022-08-08 DIAGNOSIS — M5417 Radiculopathy, lumbosacral region: Secondary | ICD-10-CM | POA: Diagnosis not present

## 2022-08-08 DIAGNOSIS — M5413 Radiculopathy, cervicothoracic region: Secondary | ICD-10-CM | POA: Diagnosis not present

## 2022-08-08 DIAGNOSIS — Z0000 Encounter for general adult medical examination without abnormal findings: Secondary | ICD-10-CM

## 2022-08-08 DIAGNOSIS — M9903 Segmental and somatic dysfunction of lumbar region: Secondary | ICD-10-CM | POA: Diagnosis not present

## 2022-08-08 DIAGNOSIS — M9901 Segmental and somatic dysfunction of cervical region: Secondary | ICD-10-CM | POA: Diagnosis not present

## 2022-08-08 NOTE — Patient Instructions (Signed)
Jose Strong , Thank you for taking time to come for your Medicare Wellness Visit. I appreciate your ongoing commitment to your health goals. Please review the following plan we discussed and let me know if I can assist you in the future.   These are the goals we discussed:  Goals      Increase physical activity     Patient Stated     Continue walking and eating right         This is a list of the screening recommended for you and due dates:  Health Maintenance  Topic Date Due   Zoster (Shingles) Vaccine (1 of 2) Never done   COVID-19 Vaccine (5 - 2023-24 season) 03/19/2022   Flu Shot  10/11/2022   Stool Blood Test  10/12/2022   Medicare Annual Wellness Visit  08/08/2023   Cologuard (Stool DNA test)  05/23/2024   DTaP/Tdap/Td vaccine (3 - Td or Tdap) 02/09/2026   Pneumonia Vaccine  Completed   Hepatitis C Screening  Completed   HPV Vaccine  Aged Out   Colon Cancer Screening  Discontinued     Next appointment: Follow up in one year for your annual wellness visit.   Preventive Care 6 Years and Older, Male Preventive care refers to lifestyle choices and visits with your health care provider that can promote health and wellness. What does preventive care include? A yearly physical exam. This is also called an annual well check. Dental exams once or twice a year. Routine eye exams. Ask your health care provider how often you should have your eyes checked. Personal lifestyle choices, including: Daily care of your teeth and gums. Regular physical activity. Eating a healthy diet. Avoiding tobacco and drug use. Limiting alcohol use. Practicing safe sex. Taking low doses of aspirin every day. Taking vitamin and mineral supplements as recommended by your health care provider. What happens during an annual well check? The services and screenings done by your health care provider during your annual well check will depend on your age, overall health, lifestyle risk factors, and  family history of disease. Counseling  Your health care provider may ask you questions about your: Alcohol use. Tobacco use. Drug use. Emotional well-being. Home and relationship well-being. Sexual activity. Eating habits. History of falls. Memory and ability to understand (cognition). Work and work Astronomer. Screening  You may have the following tests or measurements: Height, weight, and BMI. Blood pressure. Lipid and cholesterol levels. These may be checked every 5 years, or more frequently if you are over 45 years old. Skin check. Lung cancer screening. You may have this screening every year starting at age 39 if you have a 30-pack-year history of smoking and currently smoke or have quit within the past 15 years. Fecal occult blood test (FOBT) of the stool. You may have this test every year starting at age 65. Flexible sigmoidoscopy or colonoscopy. You may have a sigmoidoscopy every 5 years or a colonoscopy every 10 years starting at age 1. Prostate cancer screening. Recommendations will vary depending on your family history and other risks. Hepatitis C blood test. Hepatitis B blood test. Sexually transmitted disease (STD) testing. Diabetes screening. This is done by checking your blood sugar (glucose) after you have not eaten for a while (fasting). You may have this done every 1-3 years. Abdominal aortic aneurysm (AAA) screening. You may need this if you are a current or former smoker. Osteoporosis. You may be screened starting at age 46 if you are at high risk.  Talk with your health care provider about your test results, treatment options, and if necessary, the need for more tests. Vaccines  Your health care provider may recommend certain vaccines, such as: Influenza vaccine. This is recommended every year. Tetanus, diphtheria, and acellular pertussis (Tdap, Td) vaccine. You may need a Td booster every 10 years. Zoster vaccine. You may need this after age 3. Pneumococcal  13-valent conjugate (PCV13) vaccine. One dose is recommended after age 23. Pneumococcal polysaccharide (PPSV23) vaccine. One dose is recommended after age 13. Talk to your health care provider about which screenings and vaccines you need and how often you need them. This information is not intended to replace advice given to you by your health care provider. Make sure you discuss any questions you have with your health care provider. Document Released: 03/25/2015 Document Revised: 11/16/2015 Document Reviewed: 12/28/2014 Elsevier Interactive Patient Education  2017 [employer].  Fall Prevention in the Home Falls can cause injuries. They can happen to people of all ages. There are many things you can do to make your home safe and to help prevent falls. What can I do on the outside of my home? Regularly fix the edges of walkways and driveways and fix any cracks. Remove anything that might make you trip as you walk through a door, such as a raised step or threshold. Trim any bushes or trees on the path to your home. Use bright outdoor lighting. Clear any walking paths of anything that might make someone trip, such as rocks or tools. Regularly check to see if handrails are loose or broken. Make sure that both sides of any steps have handrails. Any raised decks and porches should have guardrails on the edges. Have any leaves, snow, or ice cleared regularly. Use sand or salt on walking paths during winter. Clean up any spills in your garage right away. This includes oil or grease spills. What can I do in the bathroom? Use night lights. Install grab bars by the toilet and in the tub and shower. Do not use towel bars as grab bars. Use non-skid mats or decals in the tub or shower. If you need to sit down in the shower, use a plastic, non-slip stool. Keep the floor dry. Clean up any water that spills on the floor as soon as it happens. Remove soap buildup in the tub or shower regularly. Attach  bath mats securely with double-sided non-slip rug tape. Do not have throw rugs and other things on the floor that can make you trip. What can I do in the bedroom? Use night lights. Make sure that you have a light by your bed that is easy to reach. Do not use any sheets or blankets that are too big for your bed. They should not hang down onto the floor. Have a firm chair that has side arms. You can use this for support while you get dressed. Do not have throw rugs and other things on the floor that can make you trip. What can I do in the kitchen? Clean up any spills right away. Avoid walking on wet floors. Keep items that you use a lot in easy-to-reach places. If you need to reach something above you, use a strong step stool that has a grab bar. Keep electrical cords out of the way. Do not use floor polish or wax that makes floors slippery. If you must use wax, use non-skid floor wax. Do not have throw rugs and other things on the floor that can make  you trip. What can I do with my stairs? Do not leave any items on the stairs. Make sure that there are handrails on both sides of the stairs and use them. Fix handrails that are broken or loose. Make sure that handrails are as long as the stairways. Check any carpeting to make sure that it is firmly attached to the stairs. Fix any carpet that is loose or worn. Avoid having throw rugs at the top or bottom of the stairs. If you do have throw rugs, attach them to the floor with carpet tape. Make sure that you have a light switch at the top of the stairs and the bottom of the stairs. If you do not have them, ask someone to add them for you. What else can I do to help prevent falls? Wear shoes that: Do not have high heels. Have rubber bottoms. Are comfortable and fit you well. Are closed at the toe. Do not wear sandals. If you use a stepladder: Make sure that it is fully opened. Do not climb a closed stepladder. Make sure that both sides of the  stepladder are locked into place. Ask someone to hold it for you, if possible. Clearly mark and make sure that you can see: Any grab bars or handrails. First and last steps. Where the edge of each step is. Use tools that help you move around (mobility aids) if they are needed. These include: Canes. Walkers. Scooters. Crutches. Turn on the lights when you go into a dark area. Replace any light bulbs as soon as they burn out. Set up your furniture so you have a clear path. Avoid moving your furniture around. If any of your floors are uneven, fix them. If there are any pets around you, be aware of where they are. Review your medicines with your doctor. Some medicines can make you feel dizzy. This can increase your chance of falling. Ask your doctor what other things that you can do to help prevent falls. This information is not intended to replace advice given to you by your health care provider. Make sure you discuss any questions you have with your health care provider. Document Released: 12/23/2008 Document Revised: 08/04/2015 Document Reviewed: 04/02/2014 Elsevier Interactive Patient Education  2017 [employer].

## 2022-08-08 NOTE — Progress Notes (Signed)
Subjective:  Pt completed ADLs, Fall risk,, and SDOH during e-check in on 08/07/22.  Answers verified with pt.    Jose Strong is a 72 y.o. male who presents for Medicare Annual/Subsequent preventive examination.  I connected with  Jose Strong on 08/08/22 by a audio enabled telemedicine application and verified that I am speaking with the correct person using two identifiers.  Patient Location: Home  Provider Location: Office/Clinic  I discussed the limitations of evaluation and management by telemedicine. The patient expressed understanding and agreed to proceed.   Review of Systems     Cardiac Risk Factors include: advanced age (>11men, >39 women);male gender;dyslipidemia;hypertension     Objective:    There were no vitals filed for this visit. There is no height or weight on file to calculate BMI.     08/08/2022    8:23 AM 11/06/2021   10:19 AM 08/02/2021    9:45 AM 07/21/2020    9:46 AM 06/16/2019   10:19 AM 04/03/2014    2:11 PM  Advanced Directives  Does Patient Have a Medical Advance Directive? Yes Yes Yes Yes No No  Type of Estate agent of [place];Living will Healthcare Power of [place];Living will Healthcare Power of [employer] of [place];Living will    Copy of Healthcare Power of Attorney in Chart? No - copy requested No - copy requested No - copy requested     Would patient like information on creating a medical advance directive?     No - Patient declined No - patient declined information    Current Medications (verified) Outpatient Encounter Medications as of 08/08/2022  Medication Sig   acetaminophen (TYLENOL) 500 MG tablet Take 500 mg by mouth every 6 (six) hours as needed for moderate pain.   Calcium-Magnesium-Zinc 167-83-8 MG TABS Take 1 tablet by mouth daily.   carvedilol (COREG) 12.5 MG tablet TAKE 1 TABLET (12.5MG  TOTAL) BY MOUTH TWICE A DAY WITH MEALS   clotrimazole-betamethasone (LOTRISONE) cream Apply 1  application topically 2 (two) times daily. (Patient taking differently: Apply 1 application  topically 2 (two) times daily as needed (eczema).)   dupilumab (DUPIXENT) 200 MG/1. prefilled syringe Inject 200 mg into the skin every 14 (fourteen) days.   Garlic (GARLIQUE PO) Take 1 capsule by mouth daily.   loratadine (CLARITIN) 10 MG tablet Take 1 tablet (10 mg total) by mouth daily.   Multiple Vitamin (MULTIVITAMIN) tablet Take 1 tablet by mouth daily.   Omega-3 Fatty Acids (FISH OIL) 1000 MG CPDR 3000 mg by mouth twice daily.   tadalafil (CIALIS) 20 MG tablet Take 0.5-1 tablets (10-20 mg total) by mouth every other day as needed for erectile dysfunction.   vitamin E 180 MG (400 UNITS) capsule Take 400 Units by mouth daily.   No facility-administered encounter medications on file as of 08/08/2022.    Allergies (verified) Advil [ibuprofen] and Amlodipine   History: Past Medical History:  Diagnosis Date   Allergy    Cancer (HCC) 2011   melanoma --neck   COVID-19 virus infection 02/25/2022   Essential hypertension 05/20/2018   Hyperlipidemia    Malignant melanoma (HCC) 05/19/2020   stage 1 A, removed by Dr. Reginia Naas and refered to Selina Cooley for wide excision   Melanoma [facility]) 2022   Past Surgical History:  Procedure Laterality Date   HERNIA REPAIR     INGUINAL HERNIA REPAIR N/A 11/09/2021   Procedure: BILATERAL INGUINAL HERNIA REPAIR WITH MESH;  Surgeon: Griselda Miner, MD;  Location: [facility]  OR;  Service: General;  Laterality: N/A;  GENERAL AND TAP BLOCK   MOHS SURGERY  03/12/2009   MOHS SURGERY Right 07/2020   non-invasive melanoma   TONSILLECTOMY  03/12/1962   TONSILLECTOMY     UMBILICAL HERNIA REPAIR N/A 11/09/2021   Procedure: HERNIA REPAIR UMBILICAL ADULT WITH MESH;  Surgeon: Griselda Miner, MD;  [facility];  Service: General;  Laterality: N/A;  GENERAL AND TAP BLOCK   Family History  Problem Relation Age of Onset   COPD Mother    Angina Mother    Social History    Socioeconomic History   Marital status: Widowed    Spouse name: Not on file   Number of children: 0   Years of education: Not on file   Highest education level: 12th grade  Occupational History   Not on file  Tobacco Use   Smoking status: Former    Packs/day: 0.50    Years: 20.00    Additional pack years: 0.00    Total pack years: 10.00    Types: Cigarettes    Quit date: 06/28/1990    Years since quitting: 32.1   Smokeless tobacco: Never  Vaping Use   Vaping Use: Never used  Substance and Sexual Activity   Alcohol use: Yes    Alcohol/week: 12.0 standard drinks of alcohol    Types: 12 Cans of beer per week   Drug use: Never   Sexual activity: Yes    Partners: Female  Other Topics Concern   Not on file  Social History Narrative   Wife died 08-23-14, had cancer   Unemployed    Completed HS   Not working   Has worked in Therapist, 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     Social Determinants of Health   Financial Resource Strain: Low Risk  (08/07/2022)   Overall Financial Resource Strain (CARDIA)    Difficulty of Paying Living Expenses: Not very hard  Food Insecurity: Food Insecurity Present (08/07/2022)   Hunger Vital Sign    Worried About Running Out of Food in the Last Year: Sometimes true    Ran Out of Food in the Last Year: Never true  Transportation Needs: No Transportation Needs (08/07/2022)   PRAPARE - Administrator, Civil Service (Medical): No    Lack of Transportation (Non-Medical): No  Physical Activity: Insufficiently Active (08/07/2022)   Exercise Vital Sign    Days of Exercise per Week: 3 days    Minutes of Exercise per Session: 40 min  Stress: No Stress Concern Present (08/07/2022)   [employer] of Occupational Health - Occupational Stress Questionnaire    Feeling of Stress : Not at all  Social Connections: Moderately Integrated (08/07/2022)   Social Connection and Isolation Panel  [NHANES]    Frequency of Communication with Friends and Family: Three times a week    Frequency of Social Gatherings with Friends and Family: Twice a week    Attends Religious Services: More than 4 times per year    Active Member of [employer] or Organizations: Yes    Attends Banker Meetings: More than 4 times per year    Marital Status: Widowed  Recent Concern: Social Connections - Moderately Isolated (06/13/2022)   Social Connection and Isolation Panel [NHANES]    Frequency of Communication with Friends and Family: More than three times a week    Frequency of Social Gatherings with Friends  and Family: Three times a week    Attends Religious Services: More than 4 times per year    Active Member of Clubs or Organizations: No    Attends Banker Meetings: Not on file    Marital Status: Widowed    Tobacco Counseling Counseling given: Not Answered   Clinical Intake:  Pre-visit preparation completed: Yes  Pain : No/denies pain  Nutritional Risks: None Diabetes: No  How often do you need to have someone help you when you read instructions, pamphlets, or other written materials from your doctor or pharmacy?: 1 - Never   Activities of Daily Living    08/07/2022   10:47 AM 11/06/2021   10:27 AM  In your present state of health, do you have any difficulty performing the following activities:  Hearing? 1   Comment some slight hearing loss in left ear   Vision? 0   Difficulty concentrating or making decisions? 0   Walking or climbing stairs? 0   Dressing or bathing? 0   Doing errands, shopping? 0 0  Preparing Food and eating ? N   Using the Toilet? N   In the past six months, have you accidently leaked urine? N   Do you have problems with loss of bowel control? N   Managing your Medications? N   Managing your Finances? N   Housekeeping or managing your Housekeeping? N     Patient Care Team: Sandford Craze, NP as PCP - General (Internal  Medicine)  Indicate any recent Medical Services you may have received from other than [facility] providers in the past year (date may be approximate).     Assessment:   This is a routine wellness examination for [place].  Hearing/Vision screen No results found.  Dietary issues and exercise activities discussed: Current Exercise Habits: Home exercise routine, Type of exercise: walking, Time (Minutes): 45, Frequency (Times/Week): 3, Weekly Exercise (Minutes/Week): 135, Intensity: Mild, Exercise limited by: None identified   Goals Addressed   None    Depression Screen    08/08/2022    8:23 AM 06/19/2022    9:46 AM 08/02/2021    9:44 AM 07/21/2020    9:50 AM 06/16/2019   10:22 AM 05/19/2019    8:44 AM 01/08/2018   11:01 AM  PHQ 2/9 Scores  PHQ - 2 Score 0 0 0 0 0 0 0  PHQ- 9 Score       0    Fall Risk    08/07/2022   10:47 AM 06/19/2022    9:46 AM 08/02/2021    9:46 AM 07/21/2020    9:48 AM 06/16/2019   10:21 AM  Fall Risk   Falls in the past year? 0 0 0 0 0  Number falls in past yr: 0 0 0 0 0  Injury with Fall? 0 0 0 0 0  Risk for fall due to : No Fall Risks No Fall Risks Impaired vision    Follow up Falls evaluation completed Falls evaluation completed Falls prevention discussed Falls prevention discussed Education provided;Falls prevention discussed    FALL RISK PREVENTION PERTAINING TO THE HOME:  Any stairs in or around the home? Yes  If so, are there any without handrails? No  Home free of loose throw rugs in walkways, pet beds, electrical cords, etc? Yes  Adequate lighting in your home to reduce risk of falls? Yes   ASSISTIVE DEVICES UTILIZED TO PREVENT FALLS:  Life alert? No  Use of a cane, walker or w/c? No  Grab bars in the bathroom? Yes  Shower chair or bench in shower? No  Elevated toilet seat or a handicapped toilet? No   TIMED UP AND GO:  Was the test performed?  No, audio visit .   Cognitive Function:        08/08/2022    8:30 AM 08/02/2021    9:47 AM  6CIT  Screen  What Year? 0 points 0 points  What month? 0 points 0 points  What time? 0 points 0 points  Count back from 20 0 points 0 points  Months in reverse 0 points 0 points  Repeat phrase 2 points 2 points  Total Score 2 points 2 points    Immunizations Immunization History  Administered Date(s) Administered   COVID-19, mRNA, vaccine(Comirnaty)12 years and older 01/22/2022   Fluad Quad(high Dose 65+) 11/18/2018, 11/24/2019, 12/06/2020, 12/15/2021   Influenza, High Dose Seasonal PF 01/08/2018   Influenza,inj,Quad PF,6+ Mos 12/08/2012, 12/08/2013, 12/27/2015, 12/31/2016   PFIZER(Purple Top)SARS-COV-2 Vaccination 05/27/2019, 06/17/2019, 02/09/2020   Pneumococcal Conjugate-13 01/08/2018   Pneumococcal Polysaccharide-23 11/24/2019   Td 02/10/2016   Tdap 03/12/2006   Zoster, Live 12/08/2013    TDAP status: Up to date  Flu Vaccine status: Up to date  Pneumococcal vaccine status: Up to date  Covid-19 vaccine status: Information provided on how to obtain vaccines.   Qualifies for Shingles Vaccine? Yes   Zostavax completed Yes   Shingrix Completed?: No.    Education has been provided regarding the importance of this vaccine. Patient has been advised to call insurance company to determine out of pocket expense if they have not yet received this vaccine. Advised may also receive vaccine at local pharmacy or Health Dept. Verbalized acceptance and understanding.  Screening Tests Health Maintenance  Topic Date Due   Zoster Vaccines- Shingrix (1 of 2) Never done   COVID-19 Vaccine (5 - 2023-24 season) 03/19/2022   Medicare Annual Wellness (AWV)  08/03/2022   INFLUENZA VACCINE  10/11/2022   COLON CANCER SCREENING ANNUAL FOBT  10/12/2022   Fecal DNA (Cologuard)  05/23/2024   DTaP/Tdap/Td (3 - Td or Tdap) 02/09/2026   Pneumonia Vaccine 10+ Years old  Completed   Hepatitis C Screening  Completed   HPV VACCINES  Aged Out   Colonoscopy  Discontinued    Health Maintenance  Health  Maintenance Due  Topic Date Due   Zoster Vaccines- Shingrix (1 of 2) Never done   COVID-19 Vaccine (5 - 2023-24 season) 03/19/2022   Medicare Annual Wellness (AWV)  08/03/2022    Colorectal cancer screening: Type of screening: Cologuard. Completed 05/23/21. Repeat every 3 years  Lung Cancer Screening: (Low Dose CT Chest recommended if Age 42-80 years, 30 pack-year currently smoking OR have quit w/in 15years.) does not qualify.   Additional Screening:  Hepatitis C Screening: does qualify; Completed 09/17/17  Vision Screening: Recommended annual ophthalmology exams for early detection of glaucoma and other disorders of the eye. Is the patient up to date with their annual eye exam?  No  Who is the provider or what is the name of the office in which the patient attends annual eye exams? Doesn't have one at this time If pt is not established with a provider, would they like to be referred to a provider to establish care? No .   Dental Screening: Recommended annual dental exams for proper oral hygiene  Community Resource Referral / Chronic Care Management: CRR required this visit?  No   CCM required this visit?  No  Plan:     I have personally reviewed and noted the following in the patient's chart:   Medical and social history Use of alcohol, tobacco or illicit drugs  Current medications and supplements including opioid prescriptions. Patient is not currently taking opioid prescriptions. Functional ability and status Nutritional status Physical activity Advanced directives List of other physicians Hospitalizations, surgeries, and ER visits in previous 12 months Vitals Screenings to include cognitive, depression, and falls Referrals and appointments  In addition, I have reviewed and discussed with patient certain preventive protocols, quality metrics, and best practice recommendations. A written personalized care plan for preventive services as well as general preventive  health recommendations were provided to patient.   Due to this being a telephonic visit, the after visit summary with patients personalized plan was offered to patient via mail or my-chart.  Patient would like to access on my-chart.  Donne Anon, [place]   08/08/2022   Nurse Notes: None

## 2022-09-27 ENCOUNTER — Other Ambulatory Visit: Payer: Self-pay | Admitting: Family

## 2022-12-19 ENCOUNTER — Ambulatory Visit (INDEPENDENT_AMBULATORY_CARE_PROVIDER_SITE_OTHER): Payer: Medicare HMO | Admitting: Family

## 2022-12-19 VITALS — BP 135/68 | HR 55 | Temp 98.1°F | Resp 16 | Wt 168.0 lb

## 2022-12-19 DIAGNOSIS — I10 Essential (primary) hypertension: Secondary | ICD-10-CM

## 2022-12-19 DIAGNOSIS — N529 Male erectile dysfunction, unspecified: Secondary | ICD-10-CM

## 2022-12-19 DIAGNOSIS — E785 Hyperlipidemia, unspecified: Secondary | ICD-10-CM | POA: Diagnosis not present

## 2022-12-19 DIAGNOSIS — L309 Dermatitis, unspecified: Secondary | ICD-10-CM | POA: Diagnosis not present

## 2022-12-19 DIAGNOSIS — I499 Cardiac arrhythmia, unspecified: Secondary | ICD-10-CM | POA: Diagnosis not present

## 2022-12-19 DIAGNOSIS — R972 Elevated prostate specific antigen [PSA]: Secondary | ICD-10-CM

## 2022-12-19 DIAGNOSIS — Z23 Encounter for immunization: Secondary | ICD-10-CM | POA: Diagnosis not present

## 2022-12-19 DIAGNOSIS — C439 Malignant melanoma of skin, unspecified: Secondary | ICD-10-CM

## 2022-12-19 DIAGNOSIS — R739 Hyperglycemia, unspecified: Secondary | ICD-10-CM

## 2022-12-19 LAB — LIPID PANEL
Cholesterol: 202 mg/dL — ABNORMAL HIGH (ref 0–200)
HDL: 49.6 mg/dL (ref >39.00)
LDL Cholesterol: 136 mg/dL — ABNORMAL HIGH (ref 0–99)
NonHDL: 152.35
Total CHOL/HDL Ratio: 4
Triglycerides: 82 mg/dL (ref 0.0–149.0)
VLDL: 16.4 mg/dL (ref 0.0–40.0)

## 2022-12-19 LAB — COMPREHENSIVE METABOLIC PANEL
ALT: 19 U/L (ref 0–53)
AST: 20 U/L (ref 0–37)
Albumin: 4.1 g/dL (ref 3.5–5.2)
Alkaline Phosphatase: 53 U/L (ref 39–117)
BUN: 11 mg/dL (ref 6–23)
CO2: 28 meq/L (ref 19–32)
Calcium: 9.3 mg/dL (ref 8.4–10.5)
Chloride: 103 meq/L (ref 96–112)
Creatinine, Ser: 0.67 mg/dL (ref 0.40–1.50)
GFR: 94.78 mL/min (ref >60.00)
Glucose, Bld: 105 mg/dL — ABNORMAL HIGH (ref 70–99)
Potassium: 4.4 meq/L (ref 3.5–5.1)
Sodium: 139 meq/L (ref 135–145)
Total Bilirubin: 0.5 mg/dL (ref 0.2–1.2)
Total Protein: 6.3 g/dL (ref 6.0–8.3)

## 2022-12-19 NOTE — Assessment & Plan Note (Signed)
Lab Results  Component Value Date   CHOL 215 (H) 12/15/2021   HDL 43.80 12/15/2021   LDLCALC 140 (H) 12/15/2021   LDLDIRECT 94.0 04/25/2017   TRIG 155.0 (H) 12/15/2021   CHOLHDL 5 12/15/2021   On fish oil/diet, update lipid panel.

## 2022-12-19 NOTE — Assessment & Plan Note (Signed)
EKG tracing is personally reviewed.  EKG notes NSR.  No acute changes.  EKG appears unchanged when compared to previous EKG.

## 2022-12-19 NOTE — Assessment & Plan Note (Addendum)
BP Readings from Last 3 Encounters:  12/19/22 135/68  06/19/22 128/85  12/15/21 138/88   Initial bp elevated upon arrival but follow up is ok.  Continue carvedilol.

## 2022-12-19 NOTE — Assessment & Plan Note (Signed)
Stable with prn cialis.

## 2022-12-19 NOTE — Progress Notes (Addendum)
Subjective:     Patient ID: Jose Strong, male    DOB: 17-Jul-1950, 72 y.o.   MRN: [id number]  Chief Complaint  Patient presents with   Hypertension    Here for for follow up   Hyperlipidemia    Here for follow up      Discussed the use of AI scribe software for clinical note transcription with the patient, who gave verbal consent to proceed.  History of Present Illness   The patient, with a history of melanoma, eczema, and hypertension, presents for a routine follow-up. He has been active, painting the trim of a house, which involves climbing up and down a ladder and scaffolding. He has not checked his blood pressure at home since last year.  Regarding his skin conditions (eczema), he reports that his hands are not breaking out as they were previously. He discontinued Dupixent, a medication for eczema, earlier this year after a supply issue and has not noticed a significant worsening of his symptoms. He has not followed up with dermatology this year.  The patient also has a history of elevated cholesterol, which he manages with fish oil and garlic supplements. He reports that his PSA has been trending downward and he has been released from biannual to annual follow-ups with Urology.   He is due to be transitioned to a new urologist as his previous one retired.  Lastly, he mentions that he takes Cialis as needed and has a prescription for Acetaminophen, which he does not take. He has not received a flu shot this season and it has been ten years since his last shingles shot (zostavax).          Health Maintenance Due  Topic Date Due   Zoster Vaccines- Shingrix (1 of 2) 09/24/1971   COVID-19 Vaccine (5 - 2023-24 season) 11/11/2022    Past Medical History:  Diagnosis Date   Allergy    Cancer (HCC) 2011   melanoma --neck   COVID-19 virus infection 02/25/2022   Essential hypertension 05/20/2018   Hyperlipidemia    Malignant melanoma (HCC) 05/19/2020   stage 1 A, removed by  Dr. Reginia Naas and refered to Selina Cooley for wide excision   Melanoma [facility]) 2022    Past Surgical History:  Procedure Laterality Date   HERNIA REPAIR     INGUINAL HERNIA REPAIR N/A 11/09/2021   Procedure: BILATERAL INGUINAL HERNIA REPAIR WITH MESH;  Surgeon: Griselda Miner, MD;  Location: [facility] OR;  Service: General;  Laterality: N/A;  GENERAL AND TAP BLOCK   MOHS SURGERY  03/12/2009   MOHS SURGERY Right 07/2020   non-invasive melanoma   TONSILLECTOMY  03/12/1962   TONSILLECTOMY     UMBILICAL HERNIA REPAIR N/A 11/09/2021   Procedure: HERNIA REPAIR UMBILICAL ADULT WITH MESH;  Surgeon: Griselda Miner, MD;  Location: [facility] OR;  Service: General;  Laterality: N/A;  GENERAL AND TAP BLOCK    Family History  Problem Relation Age of Onset   COPD Mother    Angina Mother     Social History   Socioeconomic History   Marital status: Widowed    Spouse name: Not on file   Number of children: 0   Years of education: Not on file   Highest education level: 12th grade  Occupational History   Not on file  Tobacco Use   Smoking status: Former    Current packs/day: 0.00    Average packs/day: 0.5 packs/day for 20.0 years (10.0 ttl pk-yrs)    Types:  Cigarettes    Start date: 06/28/1970    Quit date: 06/28/1990    Years since quitting: 32.4   Smokeless tobacco: Never  Vaping Use   Vaping status: Never Used  Substance and Sexual Activity   Alcohol use: Yes    Alcohol/week: 12.0 standard drinks of alcohol    Types: 12 Cans of beer per week   Drug use: Never   Sexual activity: Yes    Partners: Female  Other Topics Concern   Not on file  Social History Narrative   Wife died February 12, 2015, had cancer   Unemployed    Completed HS   Not working   Has worked in Therapist, 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     Social Determinants of Health   Financial Resource Strain: Low Risk  (08/07/2022)   Overall Financial Resource Strain (CARDIA)     Difficulty of Paying Living Expenses: Not very hard  Food Insecurity: Food Insecurity Present (08/07/2022)   Hunger Vital Sign    Worried About Running Out of Food in the Last Year: Sometimes true    Ran Out of Food in the Last Year: Never true  Transportation Needs: No Transportation Needs (08/07/2022)   PRAPARE - Administrator, Civil Service (Medical): No    Lack of Transportation (Non-Medical): No  Physical Activity: Insufficiently Active (08/07/2022)   Exercise Vital Sign    Days of Exercise per Week: 3 days    Minutes of Exercise per Session: 40 min  Stress: No Stress Concern Present (08/07/2022)   [employer] of Occupational Health - Occupational Stress Questionnaire    Feeling of Stress : Not at all  Social Connections: Moderately Integrated (08/07/2022)   Social Connection and Isolation Panel [NHANES]    Frequency of Communication with Friends and Family: Three times a week    Frequency of Social Gatherings with Friends and Family: Twice a week    Attends Religious Services: More than 4 times per year    Active Member of [employer] or Organizations: Yes    Attends Banker Meetings: More than 4 times per year    Marital Status: Widowed  Recent Concern: Social Connections - Moderately Isolated (06/13/2022)   Social Connection and Isolation Panel [NHANES]    Frequency of Communication with Friends and Family: More than three times a week    Frequency of Social Gatherings with Friends and Family: Three times a week    Attends Religious Services: More than 4 times per year    Active Member of Clubs or Organizations: No    Attends Banker Meetings: Not on file    Marital Status: Widowed  Intimate Partner Violence: Not At Risk (08/02/2021)   Humiliation, Afraid, Rape, and Kick questionnaire    Fear of Current or Ex-Partner: No    Emotionally Abused: No    Physically Abused: No    Sexually Abused: No    Outpatient Medications Prior to Visit   Medication Sig Dispense Refill   Calcium-Magnesium-Zinc 167-83-8 MG TABS Take 1 tablet by mouth daily.     carvedilol (COREG) 12.5 MG tablet Take 1 tablet (12.5 mg total) by mouth 2 (two) times daily with a meal. 180 tablet 1   Garlic (GARLIQUE PO) Take 1 capsule by mouth daily.     loratadine (CLARITIN) 10 MG tablet Take 1 tablet (10 mg total) by mouth daily. 90 tablet 4   Multiple  Vitamin (MULTIVITAMIN) tablet Take 1 tablet by mouth daily.     Omega-3 Fatty Acids (FISH OIL) 1000 MG CPDR 3000 mg by mouth twice daily.     tadalafil (CIALIS) 20 MG tablet Take 0.5-1 tablets (10-20 mg total) by mouth every other day as needed for erectile dysfunction. 10 tablet 11   vitamin E 180 MG (400 UNITS) capsule Take 400 Units by mouth daily.     acetaminophen (TYLENOL) 500 MG tablet Take 500 mg by mouth every 6 (six) hours as needed for moderate pain.     clotrimazole-betamethasone (LOTRISONE) cream Apply 1 application topically 2 (two) times daily. (Patient taking differently: Apply 1 application  topically 2 (two) times daily as needed (eczema).) 45 g 1   dupilumab (DUPIXENT) 200 MG/1. prefilled syringe Inject 200 mg into the skin every 14 (fourteen) days.     No facility-administered medications prior to visit.    Allergies  Allergen Reactions   Advil [Ibuprofen] Other (See Comments)    Upset Stomach   Amlodipine Rash    ROS See HPI    Objective:    Physical Exam Constitutional:      General: He is not in acute distress.    Appearance: He is well-developed.  HENT:     Head: Normocephalic and atraumatic.  Cardiovascular:     Rate and Rhythm: Normal rate. Rhythm irregular.     Heart sounds: No murmur heard. Pulmonary:     Effort: Pulmonary effort is normal. No respiratory distress.     Breath sounds: Normal breath sounds. No wheezing or rales.  Skin:    General: Skin is warm and dry.  Neurological:     Mental Status: He is alert and oriented to person, place, and time.   Psychiatric:        Behavior: Behavior normal.        Thought Content: Thought content normal.      BP 135/68   Pulse (!) 55   Temp 98.1 F (36.7 C) (Oral)   Resp 16   Wt 168 lb (76.2 kg)   SpO2 100%   BMI 24.11 kg/m  Wt Readings from Last 3 Encounters:  12/19/22 168 lb (76.2 kg)  06/19/22 178 lb (80.7 kg)  12/15/21 176 lb 3.2 oz (79.9 kg)       Assessment & Plan:   Problem List Items Addressed This Visit       Unprioritized   Malignant melanoma (HCC) - Primary    Due for follow up with dermatology.       Irregular heart beat    EKG tracing is personally reviewed.  EKG notes NSR.  No acute changes.  EKG appears unchanged when compared to previous EKG.       Relevant Orders   EKG 12-Lead (Completed)   Hyperlipidemia    Lab Results  Component Value Date   CHOL 215 (H) 12/15/2021   HDL 43.80 12/15/2021   LDLCALC 140 (H) 12/15/2021   LDLDIRECT 94.0 04/25/2017   TRIG 155.0 (H) 12/15/2021   CHOLHDL 5 12/15/2021   On fish oil/diet, update lipid panel.       Relevant Orders   Lipid panel   Hyperglycemia    Lab Results  Component Value Date   HGBA1C 5.2 06/19/2022   Last A1C WNL.        Essential hypertension    BP Readings from Last 3 Encounters:  12/19/22 135/68  06/19/22 128/85  12/15/21 138/88   Initial bp elevated upon arrival but  follow up is ok.  Continue carvedilol.        Relevant Orders   Comp Met (CMET)   Erectile dysfunction    Stable with prn cialis.        Elevated PSA    Lab Results  Component Value Date   PSA 4.44 (H) 12/06/2020   PSA 3.24 11/18/2018   PSA 3.04 12/31/2016    He is following with annual PSA at Urology.       Eczema    Stable without dupixent.       Other Visit Diagnoses     Needs flu shot       Relevant Orders   Flu Vaccine Trivalent High Dose (Fluad) (Completed)       I have discontinued Mai Nicolich "Dave"'s clotrimazole-betamethasone, dupilumab, and acetaminophen. I am also having him  maintain his multivitamin, Calcium-Magnesium-Zinc, Fish Oil, vitamin E, Garlic (GARLIQUE PO), tadalafil, loratadine, and carvedilol.  No orders of the defined types were placed in this encounter.

## 2022-12-19 NOTE — Assessment & Plan Note (Signed)
Due for follow up with dermatology.

## 2022-12-19 NOTE — Assessment & Plan Note (Addendum)
Lab Results  Component Value Date   HGBA1C 5.2 06/19/2022   Last A1C WNL.

## 2022-12-19 NOTE — Assessment & Plan Note (Signed)
Stable without dupixent.

## 2022-12-19 NOTE — Patient Instructions (Signed)
VISIT SUMMARY:  During your recent visit, we discussed your ongoing conditions including high blood pressure, eczema, a history of melanoma, elevated cholesterol, and prostate health. We also discussed your general health maintenance, including vaccinations and regular check-ups.  YOUR PLAN:  -HIGH BLOOD PRESSURE: Your blood pressure was initially high but improved upon rechecking. High blood pressure can lead to serious health problems like heart disease and stroke. Please monitor your blood pressure at home if possible.  -ECZEMA: You reported an improvement in your eczema symptoms since discontinuing Dupixent. Eczema is a condition that makes your skin red and itchy. Continue managing your symptoms as you are currently doing and follow up with a dermatologist as needed.  -MELANOMA: You have no current issues with your previous melanoma, a type of skin cancer. Continue with your annual dermatology appointments for skin checks.  -ELEVATED CHOLESTEROL: Your last cholesterol check showed slightly elevated levels. High cholesterol can increase your risk of heart disease. We will order a lipid panel to monitor your cholesterol levels.  -PROSTATE HEALTH: Your Prostate-Specific Antigen (PSA) levels have been trending downward, which is a good sign. However, since your previous urologist has retired, it's important to establish care with a new one for continued monitoring.  -COVID-19 VACCINATION: You have received both doses of the COVID-19 vaccine and had a mild case of COVID-19. We recommend getting a COVID-19 booster shot for continued protection against the virus.  -GENERAL HEALTH MAINTENANCE: We administered the influenza vaccine during your visit. We also recommend getting the Shingrix vaccine at your pharmacy to protect against shingles. We ordered an EKG due to irregular heart sounds detected during your exam. Please schedule a follow-up appointment in 6 months.  INSTRUCTIONS:  Please monitor  your blood pressure at home if possible. Continue managing your eczema as you are currently doing and follow up with a dermatologist for skin cancer screening.  We will order a lipid panel to monitor your cholesterol levels. Please establish care with a new urologist for continued monitoring of your prostate health. We recommend getting a COVID-19 booster shot and the Shingrix vaccine at your pharmacy. We ordered an EKG due to irregular heart sounds detected during your exam. Please schedule a follow-up appointment in 6 months.

## 2022-12-19 NOTE — Assessment & Plan Note (Signed)
Lab Results  Component Value Date   PSA 4.44 (H) 12/06/2020   PSA 3.24 11/18/2018   PSA 3.04 12/31/2016    He is following with annual PSA at Urology.

## 2023-02-27 ENCOUNTER — Ambulatory Visit (INDEPENDENT_AMBULATORY_CARE_PROVIDER_SITE_OTHER): Payer: Medicare HMO | Admitting: Family

## 2023-02-27 ENCOUNTER — Other Ambulatory Visit (HOSPITAL_BASED_OUTPATIENT_CLINIC_OR_DEPARTMENT_OTHER): Payer: Self-pay

## 2023-02-27 VITALS — BP 165/95 | HR 94 | Temp 98.7°F | Resp 16 | Ht 70.0 in | Wt 171.0 lb

## 2023-02-27 DIAGNOSIS — I10 Essential (primary) hypertension: Secondary | ICD-10-CM

## 2023-02-27 DIAGNOSIS — H6123 Impacted cerumen, bilateral: Secondary | ICD-10-CM | POA: Diagnosis not present

## 2023-02-27 DIAGNOSIS — H6120 Impacted cerumen, unspecified ear: Secondary | ICD-10-CM | POA: Insufficient documentation

## 2023-02-27 MED ORDER — COVID-19 MRNA VAC-TRIS(PFIZER) 30 MCG/0.3ML IM SUSY
0.3000 mL | PREFILLED_SYRINGE | Freq: Once | INTRAMUSCULAR | 0 refills | Status: AC
  Filled 2023-02-27: qty 0.3, 1d supply, fill #0

## 2023-02-27 NOTE — Progress Notes (Signed)
44  Subjective:     Patient ID: Jose Strong, male    DOB: 10/13/1950, 72 y.o.   MRN: [id number]  Chief Complaint  Patient presents with   Ear Fullness    Patient reports ear fullness     HPI  Discussed the use of AI scribe software for clinical note transcription with the patient, who gave verbal consent to proceed.  History of Present Illness   The patient, with a history of hypertension, presents with hearing loss and a sensation of 'hearing in a cone.' He reports that his voice sounds different and he is unsure if he is speaking loudly. He denies ear pain. He recently had a hearing test at [facility] where the technician noted that both ears were impacted with wax, the right ear more so than the left. The patient admits to using Q-tips, but denies 'cramming' them into the ear canal. He has not previously used wax removal kits.  The patient's hypertension is managed with twice daily carvedilol. He reports that his blood pressure is higher than usual today, but he has not yet taken his morning dose of carvedilol.       BP Readings from Last 3 Encounters:  02/27/23 (!) 165/95  12/19/22 135/68  06/19/22 128/85      Health Maintenance Due  Topic Date Due   Zoster Vaccines- Shingrix (1 of 2) 09/24/1971   COVID-19 Vaccine (5 - 2024-25 season) 11/11/2022    Past Medical History:  Diagnosis Date   Allergy    Cancer (HCC) 2011   melanoma --neck   COVID-19 virus infection 02/25/2022   Essential hypertension 05/20/2018   Hyperlipidemia    Malignant melanoma (HCC) 05/19/2020   stage 1 A, removed by Dr. Reginia Naas and refered to Selina Cooley for wide excision   Melanoma [facility]) 2022    Past Surgical History:  Procedure Laterality Date   HERNIA REPAIR     INGUINAL HERNIA REPAIR N/A 11/09/2021   Procedure: BILATERAL INGUINAL HERNIA REPAIR WITH MESH;  Surgeon: Griselda Miner, MD;  Location: [facility] OR;  Service: General;  Laterality: N/A;  GENERAL AND TAP BLOCK   MOHS SURGERY   03/12/2009   MOHS SURGERY Right 07/2020   non-invasive melanoma   TONSILLECTOMY  03/12/1962   TONSILLECTOMY     UMBILICAL HERNIA REPAIR N/A 11/09/2021   Procedure: HERNIA REPAIR UMBILICAL ADULT WITH MESH;  Surgeon: Griselda Miner, MD;  Location: [facility] OR;  Service: General;  Laterality: N/A;  GENERAL AND TAP BLOCK    Family History  Problem Relation Age of Onset   COPD Mother    Angina Mother     Social History   Socioeconomic History   Marital status: Widowed    Spouse name: Not on file   Number of children: 0   Years of education: Not on file   Highest education level: Some college, no degree  Occupational History   Not on file  Tobacco Use   Smoking status: Former    Current packs/day: 0.00    Average packs/day: 0.5 packs/day for 20.0 years (10.0 ttl pk-yrs)    Types: Cigarettes    Start date: 06/28/1970    Quit date: 06/28/1990    Years since quitting: 32.6   Smokeless tobacco: Never  Vaping Use   Vaping status: Never Used  Substance and Sexual Activity   Alcohol use: Yes    Alcohol/week: 12.0 standard drinks of alcohol    Types: 12 Cans of beer per week   Drug  use: Never   Sexual activity: Yes    Partners: Female  Other Topics Concern   Not on file  Social History Narrative   Wife died 03/17/2015, had cancer   Unemployed    Completed HS   Not working   Has worked in Therapist, 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     Social Drivers of Corporate investment banker Strain: Low Risk  (02/25/2023)   Overall Financial Resource Strain (CARDIA)    Difficulty of Paying Living Expenses: Not very hard  Food Insecurity: Food Insecurity Present (02/25/2023)   Hunger Vital Sign    Worried About Running Out of Food in the Last Year: Sometimes true    Ran Out of Food in the Last Year: Never true  Transportation Needs: No Transportation Needs (02/25/2023)   PRAPARE - Administrator, Civil Service (Medical):  No    Lack of Transportation (Non-Medical): No  Physical Activity: Insufficiently Active (02/25/2023)   Exercise Vital Sign    Days of Exercise per Week: 2 days    Minutes of Exercise per Session: 40 min  Stress: No Stress Concern Present (02/25/2023)   [employer] of Occupational Health - Occupational Stress Questionnaire    Feeling of Stress : Not at all  Social Connections: Moderately Integrated (02/25/2023)   Social Connection and Isolation Panel [NHANES]    Frequency of Communication with Friends and Family: Twice a week    Frequency of Social Gatherings with Friends and Family: Once a week    Attends Religious Services: 1 to 4 times per year    Active Member of [employer] or Organizations: No    Attends Engineer, structural: More than 4 times per year    Marital Status: Widowed  Intimate Partner Violence: Not At Risk (08/02/2021)   Humiliation, Afraid, Rape, and Kick questionnaire    Fear of Current or Ex-Partner: No    Emotionally Abused: No    Physically Abused: No    Sexually Abused: No    Outpatient Medications Prior to Visit  Medication Sig Dispense Refill   Calcium-Magnesium-Zinc 167-83-8 MG TABS Take 1 tablet by mouth daily.     carvedilol (COREG) 12.5 MG tablet Take 1 tablet (12.5 mg total) by mouth 2 (two) times daily with a meal. 180 tablet 1   Garlic (GARLIQUE PO) Take 1 capsule by mouth daily.     loratadine (CLARITIN) 10 MG tablet Take 1 tablet (10 mg total) by mouth daily. 90 tablet 4   Multiple Vitamin (MULTIVITAMIN) tablet Take 1 tablet by mouth daily.     Omega-3 Fatty Acids (FISH OIL) 1000 MG CPDR 3000 mg by mouth twice daily.     tadalafil (CIALIS) 20 MG tablet Take 0.5-1 tablets (10-20 mg total) by mouth every other day as needed for erectile dysfunction. 10 tablet 11   vitamin E 180 MG (400 UNITS) capsule Take 400 Units by mouth daily.     No facility-administered medications prior to visit.    Allergies  Allergen Reactions   Advil  [Ibuprofen] Other (See Comments)    Upset Stomach   Amlodipine Rash    ROS    See HPI Objective:    Physical Exam Constitutional:      Appearance: Normal appearance.  HENT:     Right Ear: There is impacted cerumen.     Left Ear: There is impacted cerumen.     Ears:  Comments: After irrigation R TM is examined and appears normal. Unable to visualize L TM Eyes:     Extraocular Movements: Extraocular movements intact.  Skin:    General: Skin is warm and dry.  Neurological:     Mental Status: He is alert and oriented to person, place, and time.  Psychiatric:        Mood and Affect: Mood normal.        Behavior: Behavior normal.        Thought Content: Thought content normal.        Judgment: Judgment normal.      BP (!) 165/95   Pulse 94   Temp 98.7 F (37.1 C) (Oral)   Resp 16   Ht 5\' 10"  (1.778 m)   Wt 171 lb (77.6 kg)   SpO2 100%   BMI 24.54 kg/m  Wt Readings from Last 3 Encounters:  02/27/23 171 lb (77.6 kg)  12/19/22 168 lb (76.2 kg)  06/19/22 178 lb (80.7 kg)       Assessment & Plan:   Problem List Items Addressed This Visit       Unprioritized   Essential hypertension   BP up today- did not take AM dose of carvedilol this AM. Will plan to bring him back in 2 weeks for a follow up visit with nurse for recheck. Plan to adjust medication if still above goal at that time.      Ceruminosis, bilateral - Primary    Bilateral impacted cerumen, more severe in the right ear. No associated pain. Patient reports altered hearing and voice perception. History of Q-tip use. -After verbal consent, bilateral ear irrigation was performed by CMA.  R ear wax was removed.  L ear remained impacted and lavage was discontinued as his ear became irritated. - recommend over-the-counter wax removal kits (Debrox or Ceruminex) and reattempt in office after patient has used the kit for 1-2 weeks. -Advise against Q-tip use and recommend regular use of wax removal kits for  maintenance.       I am having Avyansh Breer "Theodoro Grist" maintain his multivitamin, Calcium-Magnesium-Zinc, Fish Oil, vitamin E, Garlic (GARLIQUE PO), tadalafil, loratadine, and carvedilol.  No orders of the defined types were placed in this encounter.

## 2023-02-27 NOTE — Patient Instructions (Signed)
VISIT SUMMARY:  Today, we addressed your concerns about hearing loss and your elevated blood pressure. We discussed the impact of earwax buildup on your hearing and voice perception, and we reviewed your current hypertension management.  YOUR PLAN:  -IMPACTED CERUMEN: You have a buildup of earwax in both ears, which is more severe in your right ear. This can affect your hearing and how you perceive your voice. We will attempt to irrigate your ears today. If this is not successful, you should use an over-the-counter wax removal kit like Debrox or Ceruminex and then return to the office. Avoid using Q-tips and consider using wax removal kits regularly to prevent future buildup.  -HYPERTENSION: Your blood pressure was higher than usual today, likely because you had not taken your morning dose of Carvedilol. Hypertension is high blood pressure, which can lead to serious health issues if not managed properly. We will schedule a nurse visit in two weeks to check your blood pressure. Please remember to take your morning dose of Carvedilol before your appointment.  -HEARING LOSS: Your hearing loss may be related to the earwax buildup. After we address the earwax, you should schedule an audiology appointment with [facility] to further evaluate your hearing. If you need a referral, please let us know so we can process it for you.  INSTRUCTIONS:  Please schedule a visit in two weeks.

## 2023-02-27 NOTE — Assessment & Plan Note (Signed)
BP up today- did not take AM dose of carvedilol this AM. Will plan to bring him back in 2 weeks for a follow up visit with nurse for recheck. Plan to adjust medication if still above goal at that time.

## 2023-02-27 NOTE — Assessment & Plan Note (Signed)
  Bilateral impacted cerumen, more severe in the right ear. No associated pain. Patient reports altered hearing and voice perception. History of Q-tip use. -After verbal consent, bilateral ear irrigation was performed by CMA.  R ear wax was removed.  L ear remained impacted and lavage was discontinued as his ear became irritated. - recommend over-the-counter wax removal kits (Debrox or Ceruminex) and reattempt in office after patient has used the kit for 1-2 weeks. -Advise against Q-tip use and recommend regular use of wax removal kits for maintenance.

## 2023-03-19 ENCOUNTER — Ambulatory Visit (INDEPENDENT_AMBULATORY_CARE_PROVIDER_SITE_OTHER): Payer: Medicare HMO | Admitting: Family

## 2023-03-19 VITALS — BP 142/70 | HR 87 | Temp 97.9°F | Resp 16 | Ht 67.0 in | Wt 172.0 lb

## 2023-03-19 DIAGNOSIS — I10 Essential (primary) hypertension: Secondary | ICD-10-CM

## 2023-03-19 DIAGNOSIS — H6122 Impacted cerumen, left ear: Secondary | ICD-10-CM | POA: Diagnosis not present

## 2023-03-19 DIAGNOSIS — N529 Male erectile dysfunction, unspecified: Secondary | ICD-10-CM

## 2023-03-19 MED ORDER — TADALAFIL 20 MG PO TABS: 10.0000 mg | ORAL_TABLET | ORAL | 11 refills | Status: DC | PRN

## 2023-03-19 NOTE — Assessment & Plan Note (Signed)
 After verbal consent, CMA flushed left ear.  Cerumen plug was removed with irrigation. Pt tolerated procedure well.

## 2023-03-19 NOTE — Assessment & Plan Note (Signed)
 Requests refill of cialis. Rx sent to [facility]

## 2023-03-19 NOTE — Patient Instructions (Signed)
 VISIT SUMMARY:  You came in today for a routine follow-up regarding your hypertension and reported some elevated blood pressure readings, which you believe are due to reduced physical activity during the winter. You also mentioned issues with ear wax buildup in your left ear and requested a renewal of your Cialis  prescription. Additionally, we discussed updating your shingles vaccination.  YOUR PLAN:  -HYPERTENSION: Hypertension, or high blood pressure, is when the force of the blood against your artery walls is too high. We will continue your current medication, encourage you to increase physical activity, and recheck your blood pressure in 3 months.  -CERUMEN IMPACTION: Cerumen impaction is a buildup of earwax that can block the ear canal. We removed the wax plug today with irrigation. Please continue the wax drops at home to prevent future build up.   -ERECTILE DYSFUNCTION: Erectile dysfunction is the inability to get or keep an erection firm enough for sex. We will renew your Cialis  prescription, which you can fill at [facility].  -SHINGLES VACCINATION: You received the older version of the shingles vaccine in 2015. We recommend that you get the updated shingles vaccine at your pharmacy. This is a 2 dose series.  -GENERAL HEALTH MAINTENANCE: Continue your weight loss efforts and keep monitoring your blood pressure at home. Report any concerns you may have.  INSTRUCTIONS:  Please follow up in 3 months to recheck your blood pressure.

## 2023-03-19 NOTE — Assessment & Plan Note (Signed)
  Blood pressure improved but still mildly elevated but patient attributes this to lack of physical activity. Patient is monitoring blood pressure at home and has reported lower readings. -recommended that he increase his carvedilol  12.5mg  to 1.5 tabs bid- he declines this increase for now and wishes to work on lifestyle modifications. -Encourage increased physical activity. -Recheck blood pressure in 3 months.

## 2023-03-19 NOTE — Progress Notes (Signed)
 Subjective:     Patient ID: Jose Strong, male    DOB: 22-Nov-1950, 73 y.o.   MRN: [id number]  Chief Complaint  Patient presents with   Hypertension    Here for follow up   Ear Fullness    Left ear fullness    Discussed the use of AI scribe software for clinical note transcription with the patient, who gave verbal consent to proceed.  History of Present Illness   The patient, with a history of hypertension, presents for a routine follow-up. He reports that his blood pressure has been elevated, but he attributes this to a lack of physical activity during the winter months. He has been monitoring his blood pressure at home and reports a reading of 104/84 after dinner one evening. He has been trying to manage his blood pressure with lifestyle modifications, including drinking hibiscus tea and reducing his sodium intake.   In addition to his hypertension, the patient has been experiencing issues with his left ear. Despite using drops, he reports that there is still a big plug of wax deep in the left ear. He also requests a renewal of his Cialis  prescription, which he gets filled at [facility].           Health Maintenance Due  Topic Date Due   Zoster Vaccines- Shingrix (1 of 2) 09/24/1971    Past Medical History:  Diagnosis Date   Allergy    Cancer (HCC) 2011   melanoma --neck   COVID-19 virus infection 02/25/2022   Essential hypertension 05/20/2018   Hyperlipidemia    Malignant melanoma (HCC) 05/19/2020   stage 1 A, removed by Dr. Dallas sharps and refered to Rockey Hind for wide excision   Melanoma [facility]) 2022    Past Surgical History:  Procedure Laterality Date   HERNIA REPAIR     INGUINAL HERNIA REPAIR N/A 11/09/2021   Procedure: BILATERAL INGUINAL HERNIA REPAIR WITH MESH;  Surgeon: Curvin Deward MOULD, MD;  Location: [facility] OR;  Service: General;  Laterality: N/A;  GENERAL AND TAP BLOCK   MOHS SURGERY  03/12/2009   MOHS SURGERY Right 07/2020   non-invasive melanoma    TONSILLECTOMY  03/12/1962   TONSILLECTOMY     UMBILICAL HERNIA REPAIR N/A 11/09/2021   Procedure: HERNIA REPAIR UMBILICAL ADULT WITH MESH;  Surgeon: Curvin Deward MOULD, MD;  Location: [facility] OR;  Service: General;  Laterality: N/A;  GENERAL AND TAP BLOCK    Family History  Problem Relation Age of Onset   COPD Mother    Angina Mother     Social History   Socioeconomic History   Marital status: Widowed    Spouse name: Not on file   Number of children: 0   Years of education: Not on file   Highest education level: Some college, no degree  Occupational History   Not on file  Tobacco Use   Smoking status: Former    Current packs/day: 0.00    Average packs/day: 0.5 packs/day for 20.0 years (10.0 ttl pk-yrs)    Types: Cigarettes    Start date: 06/28/1970    Quit date: 06/28/1990    Years since quitting: 32.7   Smokeless tobacco: Never  Vaping Use   Vaping status: Never Used  Substance and Sexual Activity   Alcohol use: Yes    Alcohol/week: 12.0 standard drinks of alcohol    Types: 12 Cans of beer per week   Drug use: Never   Sexual activity: Yes    Partners: Female  Other Topics  Concern   Not on file  Social History Narrative   Wife died 08-Mar-2015, had cancer   Unemployed    Completed HS   Not working   Has worked in therapist, 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     Social Drivers of Corporate Investment Banker Strain: Low Risk  (02/25/2023)   Overall Financial Resource Strain (CARDIA)    Difficulty of Paying Living Expenses: Not very hard  Food Insecurity: Food Insecurity Present (02/25/2023)   Hunger Vital Sign    Worried About Running Out of Food in the Last Year: Sometimes true    Ran Out of Food in the Last Year: Never true  Transportation Needs: No Transportation Needs (02/25/2023)   PRAPARE - Administrator, Civil Service (Medical): No    Lack of Transportation (Non-Medical): No  Physical Activity:  Insufficiently Active (02/25/2023)   Exercise Vital Sign    Days of Exercise per Week: 2 days    Minutes of Exercise per Session: 40 min  Stress: No Stress Concern Present (02/25/2023)   [employer] of Occupational Health - Occupational Stress Questionnaire    Feeling of Stress : Not at all  Social Connections: Moderately Integrated (02/25/2023)   Social Connection and Isolation Panel [NHANES]    Frequency of Communication with Friends and Family: Twice a week    Frequency of Social Gatherings with Friends and Family: Once a week    Attends Religious Services: 1 to 4 times per year    Active Member of [employer] or Organizations: No    Attends Engineer, Structural: More than 4 times per year    Marital Status: Widowed  Intimate Partner Violence: Not At Risk (08/02/2021)   Humiliation, Afraid, Rape, and Kick questionnaire    Fear of Current or Ex-Partner: No    Emotionally Abused: No    Physically Abused: No    Sexually Abused: No    Outpatient Medications Prior to Visit  Medication Sig Dispense Refill   Calcium -Magnesium-Zinc 167-83-8 MG TABS Take 1 tablet by mouth daily.     carvedilol  (COREG ) 12.5 MG tablet Take 1 tablet (12.5 mg total) by mouth 2 (two) times daily with a meal. 180 tablet 1   Garlic (GARLIQUE PO) Take 1 capsule by mouth daily.     loratadine  (CLARITIN ) 10 MG tablet Take 1 tablet (10 mg total) by mouth daily. 90 tablet 4   Multiple Vitamin (MULTIVITAMIN) tablet Take 1 tablet by mouth daily.     Omega-3 Fatty Acids (FISH OIL ) 1000 MG CPDR 3000 mg by mouth twice daily.     vitamin E 180 MG (400 UNITS) capsule Take 400 Units by mouth daily.     tadalafil  (CIALIS ) 20 MG tablet Take 0.5-1 tablets (10-20 mg total) by mouth every other day as needed for erectile dysfunction. 10 tablet 11   No facility-administered medications prior to visit.    Allergies  Allergen Reactions   Advil [Ibuprofen] Other (See Comments)    Upset Stomach   Amlodipine  Rash     ROS See HPI    Objective:    Physical Exam Constitutional:      General: He is not in acute distress.    Appearance: He is well-developed.  HENT:     Head: Normocephalic and atraumatic.     Right Ear: Tympanic membrane and ear canal normal.     Left Ear: There is impacted  cerumen.  Cardiovascular:     Rate and Rhythm: Normal rate and regular rhythm.     Heart sounds: No murmur heard. Pulmonary:     Effort: Pulmonary effort is normal. No respiratory distress.     Breath sounds: Normal breath sounds. No wheezing or rales.  Skin:    General: Skin is warm and dry.  Neurological:     Mental Status: He is alert and oriented to person, place, and time.  Psychiatric:        Behavior: Behavior normal.        Thought Content: Thought content normal.      BP (!) 142/70   Pulse 87   Temp 97.9 F (36.6 C) (Oral)   Resp 16   Ht 5' 7 (1.702 m)   Wt 172 lb (78 kg)   SpO2 98%   BMI 26.94 kg/m  Wt Readings from Last 3 Encounters:  03/19/23 172 lb (78 kg)  02/27/23 171 lb (77.6 kg)  12/19/22 168 lb (76.2 kg)       Assessment & Plan:   Problem List Items Addressed This Visit       Unprioritized   Essential hypertension - Primary    Blood pressure improved but still mildly elevated but patient attributes this to lack of physical activity. Patient is monitoring blood pressure at home and has reported lower readings. -recommended that he increase his carvedilol  12.5mg  to 1.5 tabs bid- he declines this increase for now and wishes to work on lifestyle modifications. -Encourage increased physical activity. -Recheck blood pressure in 3 months.      Relevant Medications   tadalafil  (CIALIS ) 20 MG tablet   Erectile dysfunction   Requests refill of cialis . Rx sent to [facility].       Cerumen impaction   After verbal consent, CMA flushed left ear.  Cerumen plug was removed with irrigation. Pt tolerated procedure well.        I am having Alm Debby Lockwood maintain his  multivitamin, Calcium -Magnesium-Zinc, Fish Oil , vitamin E, Garlic (GARLIQUE PO), loratadine , carvedilol , and tadalafil .  Meds ordered this encounter  Medications   tadalafil  (CIALIS ) 20 MG tablet    Sig: Take 0.5-1 tablets (10-20 mg total) by mouth every other day as needed for erectile dysfunction.    Dispense:  10 tablet    Refill:  11    Supervising Provider:   DOMENICA BLACKBIRD A [phone]

## 2023-05-03 ENCOUNTER — Other Ambulatory Visit: Payer: Self-pay | Admitting: Family

## 2023-05-03 NOTE — Telephone Encounter (Signed)
 Refilled today 05/03/23.

## 2023-05-03 NOTE — Telephone Encounter (Signed)
 Copied from CRM [id number]. Topic: Clinical - Medication Refill >> May 03, 2023 12:00 PM Jose Strong wrote: Most Recent Primary Care Visit:  Provider: O'SULLIVAN, MELISSA  Department: [facility]  Visit Type: OFFICE VISIT  Date: 03/19/2023  Medication: carvedilol (COREG) 12.5 MG tablet  90 day supply   Has the patient contacted their pharmacy? Yes (Agent: If no, request that the patient contact the pharmacy for the refill. If patient does not wish to contact the pharmacy document the reason why and proceed with request.) (Agent: If yes, when and what did the pharmacy advise?)  Is this the correct pharmacy for this prescription? Yes If no, delete pharmacy and type the correct one.  This is the patient's preferred pharmacy:  [id number] - [place], [place] - [address] [place] [address] Phone: [phone] Fax: [phone]     Has the prescription been filled recently? Yes  Is the patient out of the medication? Yes  Has the patient been seen for an appointment in the last year OR does the patient have an upcoming appointment? Yes  Can we respond through MyChart? Yes  Agent: Please be advised that Rx refills may take up to 3 business days. We ask that you follow-up with your pharmacy.

## 2023-06-13 ENCOUNTER — Telehealth: Payer: Self-pay | Admitting: Family

## 2023-06-13 NOTE — Telephone Encounter (Signed)
 Copied from CRM [id number]. Topic: Medicare AWV >> Jun 13, 2023  1:18 PM Payton Doughty wrote: Reason for CRM: Called LVM 06/13/2023 to schedule AWV. Please schedule Virtual or Telehealth visits ONLY  Verlee Rossetti; Care Guide Ambulatory Clinical Support [place] [facility] Direct Dial: [phone]

## 2023-06-19 ENCOUNTER — Ambulatory Visit (INDEPENDENT_AMBULATORY_CARE_PROVIDER_SITE_OTHER): Payer: Medicare HMO | Admitting: Family

## 2023-06-19 VITALS — BP 150/85 | HR 85 | Temp 98.5°F | Resp 16 | Ht 67.0 in | Wt 173.0 lb

## 2023-06-19 DIAGNOSIS — I10 Essential (primary) hypertension: Secondary | ICD-10-CM

## 2023-06-19 DIAGNOSIS — N529 Male erectile dysfunction, unspecified: Secondary | ICD-10-CM | POA: Diagnosis not present

## 2023-06-19 MED ORDER — SILDENAFIL CITRATE 50 MG PO TABS: ORAL_TABLET | ORAL | 5 refills | Status: AC

## 2023-06-19 MED ORDER — CARVEDILOL 12.5 MG PO TABS: 18.2500 mg | ORAL_TABLET | Freq: Two times a day (BID) | ORAL | 1 refills | Status: DC

## 2023-06-19 NOTE — Assessment & Plan Note (Signed)
 Uncontrolled. Agreeable to increaese carvedilol from 12.5 mg to 18.75 mg bid.

## 2023-06-19 NOTE — Progress Notes (Signed)
 Subjective:     Patient ID: Jose Strong, male    DOB: [birth year], 73 y.o.   MRN: [id number]  Chief Complaint  Patient presents with   Hypertension    Here for follow up    Hypertension    Discussed the use of AI scribe software for clinical note transcription with the patient, who gave verbal consent to proceed.  History of Present Illness  Arshdeep Bolger "Theodoro Grist" is a 73 year old male with hypertension who presents with concerns about blood pressure management and erectile dysfunction.  He has hypertension with fluctuating blood pressure readings, sometimes high, normal, or below normal. He checks his blood pressure once or twice a week, noting a specific reading of 118/95 mmHg. He is on carvedilol 12.5 mg twice daily. He is concerned about variability, with readings reaching 150/95 mmHg, which he describes as 'wacko'. He feels good overall but forgot his blood pressure machine, mentioning it sometimes inflates to different pressures.  He experiences erectile dysfunction, stating he is 'not standing anymore'. Tadalafil (Cialis) taken every other day was ineffective. He has not tried sildenafil (Viagra) due to concerns about side effects. The erectile dysfunction has worsened in the last month or two. He is scheduled to see a urologist in May.  He is physically active, recently mowing the lawn, but does not engage in structured exercise. He cooks for himself, tries to eat real food, avoids low-fat or fake foods, and is into vitamins to maintain a healthy lifestyle.     Health Maintenance Due  Topic Date Due   Zoster Vaccines- Shingrix (1 of 2) 09/24/1971   Medicare Annual Wellness (AWV)  08/08/2023    Past Medical History:  Diagnosis Date   Allergy    Cancer (HCC) 2011   melanoma --neck   COVID-19 virus infection 02/25/2022   Essential hypertension 05/20/2018   Hyperlipidemia    Malignant melanoma (HCC) 05/19/2020   stage 1 A, removed by Dr. Reginia Naas and refered to  Selina Cooley for wide excision   Melanoma [facility]) 2022    Past Surgical History:  Procedure Laterality Date   HERNIA REPAIR     INGUINAL HERNIA REPAIR N/A 11/09/2021   Procedure: BILATERAL INGUINAL HERNIA REPAIR WITH MESH;  Surgeon: Griselda Miner, MD;  Location: [facility] OR;  Service: General;  Laterality: N/A;  GENERAL AND TAP BLOCK   MOHS SURGERY  03/12/2009   MOHS SURGERY Right 07/2020   non-invasive melanoma   TONSILLECTOMY  03/12/1962   TONSILLECTOMY     UMBILICAL HERNIA REPAIR N/A 11/09/2021   Procedure: HERNIA REPAIR UMBILICAL ADULT WITH MESH;  Surgeon: Griselda Miner, MD;  Location: [facility] OR;  Service: General;  Laterality: N/A;  GENERAL AND TAP BLOCK    Family History  Problem Relation Age of Onset   COPD Mother    Angina Mother     Social History   Socioeconomic History   Marital status: Widowed    Spouse name: Not on file   Number of children: 0   Years of education: Not on file   Highest education level: Some college, no degree  Occupational History   Not on file  Tobacco Use   Smoking status: Former    Current packs/day: 0.00    Average packs/day: 0.5 packs/day for 20.0 years (10.0 ttl pk-yrs)    Types: Cigarettes    Start date: 06/28/1970    Quit date: 06/28/1990    Years since quitting: 32.9   Smokeless tobacco: Never  Vaping Use  Vaping status: Never Used  Substance and Sexual Activity   Alcohol use: Yes    Alcohol/week: 12.0 standard drinks of alcohol    Types: 12 Cans of beer per week   Drug use: Never   Sexual activity: Yes    Partners: Female  Other Topics Concern   Not on file  Social History Narrative   Wife died 07/10/2014, had cancer   Unemployed    Completed HS   Not working   Has worked in Therapist, 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     Social Drivers of Corporate investment banker Strain: Low Risk  (02/25/2023)   Overall Financial Resource Strain (CARDIA)    Difficulty of Paying  Living Expenses: Not very hard  Food Insecurity: Food Insecurity Present (02/25/2023)   Hunger Vital Sign    Worried About Running Out of Food in the Last Year: Sometimes true    Ran Out of Food in the Last Year: Never true  Transportation Needs: No Transportation Needs (02/25/2023)   PRAPARE - Administrator, Civil Service (Medical): No    Lack of Transportation (Non-Medical): No  Physical Activity: Insufficiently Active (02/25/2023)   Exercise Vital Sign    Days of Exercise per Week: 2 days    Minutes of Exercise per Session: 40 min  Stress: No Stress Concern Present (02/25/2023)   [employer] of Occupational Health - Occupational Stress Questionnaire    Feeling of Stress : Not at all  Social Connections: Moderately Integrated (02/25/2023)   Social Connection and Isolation Panel [NHANES]    Frequency of Communication with Friends and Family: Twice a week    Frequency of Social Gatherings with Friends and Family: Once a week    Attends Religious Services: 1 to 4 times per year    Active Member of [employer] or Organizations: No    Attends Engineer, structural: More than 4 times per year    Marital Status: Widowed  Intimate Partner Violence: Not At Risk (08/02/2021)   Humiliation, Afraid, Rape, and Kick questionnaire    Fear of Current or Ex-Partner: No    Emotionally Abused: No    Physically Abused: No    Sexually Abused: No    Outpatient Medications Prior to Visit  Medication Sig Dispense Refill   Calcium-Magnesium-Zinc 167-83-8 MG TABS Take 1 tablet by mouth daily.     Garlic (GARLIQUE PO) Take 1 capsule by mouth daily.     loratadine (CLARITIN) 10 MG tablet Take 1 tablet (10 mg total) by mouth daily. 90 tablet 4   Multiple Vitamin (MULTIVITAMIN) tablet Take 1 tablet by mouth daily.     Omega-3 Fatty Acids (FISH OIL) 1000 MG CPDR 3000 mg by mouth twice daily.     vitamin E 180 MG (400 UNITS) capsule Take 400 Units by mouth daily.     carvedilol (COREG)  12.5 MG tablet Take 1 tablet (12.5 mg total) by mouth 2 (two) times daily with a meal. 180 tablet 0   tadalafil (CIALIS) 20 MG tablet Take 0.5-1 tablets (10-20 mg total) by mouth every other day as needed for erectile dysfunction. 10 tablet 11   No facility-administered medications prior to visit.    Allergies  Allergen Reactions   Advil [Ibuprofen] Other (See Comments)    Upset Stomach   Amlodipine Rash    ROS See HPI    Objective:    Physical Exam  Constitutional:      General: He is not in acute distress.    Appearance: He is well-developed.  HENT:     Head: Normocephalic and atraumatic.  Cardiovascular:     Rate and Rhythm: Normal rate and regular rhythm.     Heart sounds: No murmur heard. Pulmonary:     Effort: Pulmonary effort is normal. No respiratory distress.     Breath sounds: Normal breath sounds. No wheezing or rales.  Skin:    General: Skin is warm and dry.  Neurological:     Mental Status: He is alert and oriented to person, place, and time.  Psychiatric:        Behavior: Behavior normal.        Thought Content: Thought content normal.      BP (!) 150/85   Pulse 85   Temp 98.5 F (36.9 C) (Oral)   Resp 16   Ht 5\' 7"  (1.702 m)   Wt 173 lb (78.5 kg)   SpO2 99%   BMI 27.10 kg/m  Wt Readings from Last 3 Encounters:  06/19/23 173 lb (78.5 kg)  03/19/23 172 lb (78 kg)  02/27/23 171 lb (77.6 kg)       Assessment & Plan:   Problem List Items Addressed This Visit       Unprioritized   Essential hypertension - Primary   Uncontrolled. Agreeable to increaese carvedilol from 12.5 mg to 18.75 mg bid.       Relevant Medications   carvedilol (COREG) 12.5 MG tablet   sildenafil (VIAGRA) 50 MG tablet   Erectile dysfunction    Erectile dysfunction likely related to hx of hypertension. Previous tadalafil ineffective. Discussed vascular etiology and potential benefit of sildenafil. Referral to urology if sildenafil fails. - Prescribe sildenafil 50 mg,  take 1-2 tablets daily as needed. - Discontinue tadalafil. - He has an upcoming appointment with urology.       Relevant Medications   sildenafil (VIAGRA) 50 MG tablet    I have discontinued Sydnee Cabal "Dave"'s tadalafil. I have also changed his carvedilol. Additionally, I am having him start on sildenafil. Lastly, I am having him maintain his multivitamin, Calcium-Magnesium-Zinc, Fish Oil, vitamin E, Garlic (GARLIQUE PO), and loratadine.  Meds ordered this encounter  Medications   carvedilol (COREG) 12.5 MG tablet    Sig: Take 1.5 tablets (18.75 mg total) by mouth 2 (two) times daily with a meal.    Dispense:  270 tablet    Refill:  1    Supervising Provider:   Danise Edge A [[id number]]   sildenafil (VIAGRA) 50 MG tablet    Sig: 1-2 tabs by mouth once daily as needed for ED    Dispense:  10 tablet    Refill:  5    Supervising Provider:   Danise Edge A [[id number]]

## 2023-06-19 NOTE — Patient Instructions (Signed)
 VISIT SUMMARY:  During your visit, we discussed your concerns about managing your blood pressure and erectile dysfunction. We reviewed your current medications and lifestyle habits, and made some adjustments to your treatment plan to better address these issues.  YOUR PLAN:  -HYPERTENSION: Hypertension, or high blood pressure, means that the force of the blood against your artery walls is too high. We have decided to increase your carvedilol dosage to 18.75 mg twice daily to help stabilize your blood pressure. Please continue to monitor your blood pressure at home and record the readings. We will re-evaluate your blood pressure in June.  -ERECTILE DYSFUNCTION: Erectile dysfunction is the inability to get or keep an erection firm enough for sexual intercourse. It is often related to other health issues like hypertension. Since tadalafil was not effective for you, we are prescribing sildenafil (Viagra) at 50 mg, to be taken 1-2 tablets daily as needed. Please discontinue tadalafil.  Keep your upcoming appointment with Urology.   INSTRUCTIONS:  Please follow up in June for a re-evaluation of your blood pressure. Continue to monitor your blood pressure at home and record the readings. If you experience any side effects or if your symptoms do not improve, please contact our office.

## 2023-06-19 NOTE — Assessment & Plan Note (Signed)
  Erectile dysfunction likely related to hx of hypertension. Previous tadalafil ineffective. Discussed vascular etiology and potential benefit of sildenafil. Referral to urology if sildenafil fails. - Prescribe sildenafil 50 mg, take 1-2 tablets daily as needed. - Discontinue tadalafil. - He has an upcoming appointment with urology.

## 2023-07-31 ENCOUNTER — Ambulatory Visit (INDEPENDENT_AMBULATORY_CARE_PROVIDER_SITE_OTHER): Admitting: Family

## 2023-07-31 VITALS — BP 147/87 | HR 74 | Temp 98.2°F | Resp 16 | Ht 67.0 in | Wt 172.0 lb

## 2023-07-31 DIAGNOSIS — N529 Male erectile dysfunction, unspecified: Secondary | ICD-10-CM | POA: Diagnosis not present

## 2023-07-31 DIAGNOSIS — R972 Elevated prostate specific antigen [PSA]: Secondary | ICD-10-CM | POA: Diagnosis not present

## 2023-07-31 DIAGNOSIS — I10 Essential (primary) hypertension: Secondary | ICD-10-CM | POA: Diagnosis not present

## 2023-07-31 MED ORDER — CARVEDILOL 12.5 MG PO TABS: 25.0000 mg | ORAL_TABLET | Freq: Two times a day (BID) | ORAL | Status: DC

## 2023-07-31 NOTE — Addendum Note (Signed)
 Addended by: Dorrene Gaucher on: 07/31/2023 02:57 PM   Modules accepted: Level of Service

## 2023-07-31 NOTE — Assessment & Plan Note (Signed)
 No significant improvement with Tadalafil  or viagra . Will refer to Urology.

## 2023-07-31 NOTE — Assessment & Plan Note (Signed)
 Uncontrolled. Will increase carvedilol  to 25mg  bid.

## 2023-07-31 NOTE — Patient Instructions (Signed)
 VISIT SUMMARY:  Today, we discussed your blood pressure management and erectile dysfunction. We adjusted your blood pressure medication and planned a follow-up. We also addressed your concerns about erectile dysfunction and arranged a referral to a specialist.  YOUR PLAN:  HYPERTENSION: Your blood pressure readings have been inconsistent, and we have adjusted your medication to help manage it better. -Increase carvedilol  to 25 mg twice daily. -Recheck your blood pressure in two weeks with a nurse visit. -We may consider additional medication if your blood pressure is not at the desired level.  ERECTILE DYSFUNCTION: You have not seen improvement with Viagra , and there may be an interaction with your blood pressure medication. -We will refer you to our in-house urology specialist for further evaluation and management.

## 2023-07-31 NOTE — Progress Notes (Signed)
 Subjective:     Patient ID: Jose Strong, male    DOB: March 03, 1951, 73 y.o.   MRN: [id number]  Chief Complaint  Patient presents with   Hypertension    Here for follow up    Hypertension    Discussed the use of AI scribe software for clinical note transcription with the patient, who gave verbal consent to proceed.  History of Present Illness  Jose Strong "Jose Strong" is a 73 year old male with hypertension who presents for a follow-up on blood pressure management.  He experiences inconsistent home blood pressure readings, with some days high and others low. Erratic readings improved after changing the blood pressure machine batteries. This morning, his blood pressure was 93/85 mmHg after three attempts. He took carvedilol  at 8:00 AM, with a recent dose increase from 12.5 mg to 18 mg.  He switched from Cialis  to Viagra  for erectile dysfunction but notes no effect from Viagra . He questions if his blood pressure medication affects Viagra 's efficacy and is hesitant to change his blood pressure medication due to concerns about fluctuations.  His PSA levels were previously elevated, leading to a referral for monitoring. PSA levels have decreased, and he now has annual check-ups. He juices with grapefruit, watermelon, spinach, and pumpkin seeds, which he believes helps lower his PSA levels.  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 He feels sore from physical activities like cutting drywall and mudding, which keep him active.   BP Readings from Last 3 Encounters:  07/31/23 (!) 147/87  06/19/23 (!) 150/85  03/19/23 (!) 142/70   [     Health Maintenance Due  Topic Date Due   Zoster Vaccines- Shingrix (1 of 2) 09/24/1971   Medicare Annual Wellness (AWV)  08/08/2023    Past Medical History:  Diagnosis Date   Allergy    Cancer (HCC) 2011   melanoma --neck   COVID-19 virus infection 02/25/2022    Essential hypertension 05/20/2018   Hyperlipidemia    Malignant melanoma (HCC) 05/19/2020   stage 1 A, removed by Dr. Cherylin Corrigan and refered to Maurice Sousa for wide excision   Melanoma [facility]) 2022    Past Surgical History:  Procedure Laterality Date   HERNIA REPAIR     INGUINAL HERNIA REPAIR N/A 11/09/2021   Procedure: BILATERAL INGUINAL HERNIA REPAIR WITH MESH;  Surgeon: Caralyn Chandler, MD;  Location: [facility] OR;  Service: General;  Laterality: N/A;  GENERAL AND TAP BLOCK   MOHS SURGERY  03/12/2009   MOHS SURGERY Right 07/2020   non-invasive melanoma   TONSILLECTOMY  03/12/1962   TONSILLECTOMY     UMBILICAL HERNIA REPAIR N/A 11/09/2021   Procedure: HERNIA REPAIR UMBILICAL ADULT WITH MESH;  Surgeon: Caralyn Chandler, MD;  Location: [facility] OR;  Service: General;  Laterality: N/A;  GENERAL AND TAP BLOCK    Family History  Problem Relation Age of Onset   COPD Mother    Angina Mother     Social History   Socioeconomic History   Marital status: Widowed    Spouse name: Not on file   Number of children: 0   Years of education: Not on file   Highest education level: Some college, no degree  Occupational History   Not on file  Tobacco Use   Smoking status: Former    Current packs/day: 0.00    Average packs/day: 0.5 packs/day for 20.0 years (10.0 ttl pk-yrs)    Types: Cigarettes  Start date: 06/28/1970    Quit date: 06/28/1990    Years since quitting: 33.1   Smokeless tobacco: Never  Vaping Use   Vaping status: Never Used  Substance and Sexual Activity   Alcohol use: Yes    Alcohol/week: 12.0 standard drinks of alcohol    Types: 12 Cans of beer per week   Drug use: Never   Sexual activity: Yes    Partners: Female  Other Topics Concern   Not on file  Social History Narrative   Wife died 08-17-14, had cancer   Unemployed    Completed HS   Not working   Has worked in Therapist, 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      Social Drivers of Corporate investment banker Strain: Low Risk  (02/25/2023)   Overall Financial Resource Strain (CARDIA)    Difficulty of Paying Living Expenses: Not very hard  Food Insecurity: Food Insecurity Present (02/25/2023)   Hunger Vital Sign    Worried About Running Out of Food in the Last Year: Sometimes true    Ran Out of Food in the Last Year: Never true  Transportation Needs: No Transportation Needs (02/25/2023)   PRAPARE - Administrator, Civil Service (Medical): No    Lack of Transportation (Non-Medical): No  Physical Activity: Insufficiently Active (02/25/2023)   Exercise Vital Sign    Days of Exercise per Week: 2 days    Minutes of Exercise per Session: 40 min  Stress: No Stress Concern Present (02/25/2023)   [employer] of Occupational Health - Occupational Stress Questionnaire    Feeling of Stress : Not at all  Social Connections: Moderately Integrated (02/25/2023)   Social Connection and Isolation Panel [NHANES]    Frequency of Communication with Friends and Family: Twice a week    Frequency of Social Gatherings with Friends and Family: Once a week    Attends Religious Services: 1 to 4 times per year    Active Member of [employer] or Organizations: No    Attends Engineer, structural: More than 4 times per year    Marital Status: Widowed  Intimate Partner Violence: Not At Risk (08/02/2021)   Humiliation, Afraid, Rape, and Kick questionnaire    Fear of Current or Ex-Partner: No    Emotionally Abused: No    Physically Abused: No    Sexually Abused: No    Outpatient Medications Prior to Visit  Medication Sig Dispense Refill   Calcium -Magnesium-Zinc 167-83-8 MG TABS Take 1 tablet by mouth daily.     Garlic (GARLIQUE PO) Take 1 capsule by mouth daily.     loratadine  (CLARITIN ) 10 MG tablet Take 1 tablet (10 mg total) by mouth daily. 90 tablet 4   Multiple Vitamin (MULTIVITAMIN) tablet Take 1 tablet by mouth daily.     Omega-3 Fatty  Acids (FISH OIL ) 1000 MG CPDR 3000 mg by mouth twice daily.     sildenafil  (VIAGRA ) 50 MG tablet 1-2 tabs by mouth once daily as needed for ED 10 tablet 5   vitamin E 180 MG (400 UNITS) capsule Take 400 Units by mouth daily.     carvedilol  (COREG ) 12.5 MG tablet Take 1.5 tablets (18.75 mg total) by mouth 2 (two) times daily with a meal. 270 tablet 1   No facility-administered medications prior to visit.    Allergies  Allergen Reactions   Advil [Ibuprofen] Other (See Comments)    Upset  Stomach   Amlodipine  Rash    ROS See HPI    Objective:     Physical Exam Constitutional:      General: He is not in acute distress.    Appearance: He is well-developed.  HENT:     Head: Normocephalic and atraumatic.  Cardiovascular:     Rate and Rhythm: Normal rate and regular rhythm.     Heart sounds: No murmur heard. Pulmonary:     Effort: Pulmonary effort is normal. No respiratory distress.     Breath sounds: Normal breath sounds. No wheezing or rales.  Skin:    General: Skin is warm and dry.  Neurological:     Mental Status: He is alert and oriented to person, place, and time.  Psychiatric:        Behavior: Behavior normal.        Thought Content: Thought content normal.      BP (!) 147/87 (BP Location: Right Arm, Patient Position: Sitting, Cuff Size: Small)   Pulse 74   Temp 98.2 F (36.8 C) (Oral)   Resp 16   Ht 5\' 7"  (1.702 m)   Wt 172 lb (78 kg)   SpO2 98%   BMI 26.94 kg/m  Wt Readings from Last 3 Encounters:  07/31/23 172 lb (78 kg)  06/19/23 173 lb (78.5 kg)  03/19/23 172 lb (78 kg)       Assessment & Plan:   Problem List Items Addressed This Visit       Unprioritized   Essential hypertension   Uncontrolled. Will increase carvedilol  to 25mg  bid.       Relevant Medications   carvedilol  (COREG ) 12.5 MG tablet   Erectile dysfunction   No significant improvement with Tadalafil  or viagra . Will refer to Urology.       Relevant Orders   Ambulatory referral  to Urology   Elevated PSA - Primary   Would like referral to new urologist.  Referral placed.       Relevant Orders   Ambulatory referral to Urology    I have changed Jose Speedy "Dave"'s carvedilol . I am also having him maintain his multivitamin, Calcium -Magnesium-Zinc, Fish Oil , vitamin E, Garlic (GARLIQUE PO), loratadine , and sildenafil .  Meds ordered this encounter  Medications   carvedilol  (COREG ) 12.5 MG tablet    Sig: Take 2 tablets (25 mg total) by mouth 2 (two) times daily with a meal.    Supervising Provider:   Randie Bustle A [[id number]]

## 2023-07-31 NOTE — Assessment & Plan Note (Signed)
 Would like referral to new urologist.  Referral placed.

## 2023-08-14 ENCOUNTER — Ambulatory Visit (INDEPENDENT_AMBULATORY_CARE_PROVIDER_SITE_OTHER)

## 2023-08-14 DIAGNOSIS — I10 Essential (primary) hypertension: Secondary | ICD-10-CM

## 2023-08-14 NOTE — Progress Notes (Signed)
 Pt here for Blood pressure check per Melissa  Pt currently takes: Carvedilol  12.5mg , 2 tablets (to equal 25mg ), twice daily.    Pt reports compliance with medication.  BP today @ = 132/90 HR = 67   Recheck BP: 130/88  Pt advised per Dr. Gwenette Lennox to add Amlodipine  5mg  if patient was willing. Medication ended up being on the allergy list. Pt was scheduled with Melissa for 2 weeks. Pt was advised to cut back on the coffee and encouraged to check blood pressures at home.

## 2023-08-15 ENCOUNTER — Ambulatory Visit

## 2023-08-15 VITALS — BP 130/88 | Ht 69.0 in | Wt 172.0 lb

## 2023-08-15 DIAGNOSIS — Z2821 Immunization not carried out because of patient refusal: Secondary | ICD-10-CM

## 2023-08-15 DIAGNOSIS — Z0000 Encounter for general adult medical examination without abnormal findings: Secondary | ICD-10-CM | POA: Diagnosis not present

## 2023-08-15 NOTE — Patient Instructions (Signed)
 Mr. Jose Strong , Thank you for taking time out of your busy schedule to complete your Annual Wellness Visit with me. I enjoyed our conversation and look forward to speaking with you again next year. I, as well as your care team,  appreciate your ongoing commitment to your health goals. Please review the following plan we discussed and let me know if I can assist you in the future. Your Game plan/ To Do List    Referrals: If you haven't heard from the office you've been referred to, please reach out to them at the phone provided.  None sent Follow up Visits: Next Medicare AWV with our clinical staff: 08/20/2024   Have you seen your provider in the last 6 months (3 months if uncontrolled diabetes)? No Next Office Visit with your provider: 08/30/2023  Clinician Recommendations:  Aim for 30 minutes of exercise or brisk walking, 6-8 glasses of water, and 5 servings of fruits and vegetables each day.       This is a list of the screening recommended for you and due dates:  Health Maintenance  Topic Date Due   Zoster (Shingles) Vaccine (1 of 2) 09/24/1971   COVID-19 Vaccine (6 - Pfizer risk 2024-25 season) 08/28/2023   Flu Shot  10/11/2023   Cologuard (Stool DNA test)  05/23/2024   Medicare Annual Wellness Visit  08/14/2024   DTaP/Tdap/Td vaccine (3 - Td or Tdap) 02/09/2026   Pneumonia Vaccine  Completed   Hepatitis C Screening  Completed   HPV Vaccine  Aged Out   Meningitis B Vaccine  Aged Out    Advanced directives: (Copy Requested) Please bring a copy of your health care power of attorney and living will to the office to be added to your chart at your convenience. You can mail to [facility] [address] [place], [place] [address] or email to [email or web address] Advance Care Planning is important because it:  [x]  Makes sure you receive the medical care that is consistent with your values, goals, and preferences  [x]  It provides guidance to your family and loved  ones and reduces their decisional burden about whether or not they are making the right decisions based on your wishes.  Follow the link provided in your after visit summary or read over the paperwork we have mailed to you to help you started getting your Advance Directives in place. If you need assistance in completing these, please reach out to us  so that we can help you!  See attachments for Preventive Care and Fall Prevention Tips.

## 2023-08-15 NOTE — Progress Notes (Signed)
 Because this visit was a virtual/telehealth visit,  certain criteria was not obtained, such a blood pressure, CBG if applicable, and timed get up and go. Any medications not marked as "taking" were not mentioned during the medication reconciliation part of the visit. Any vitals not documented were not able to be obtained due to this being a telehealth visit or patient was unable to self-report a recent blood pressure reading due to a lack of equipment at home via telehealth. Vitals that have been documented are verbally provided by the patient.  This visit was performed by a medical professional under my direct supervision. I was immediately available for consultation/collaboration. I have reviewed and agree with the Annual Wellness Visit documentation.  Subjective:   Jose Strong is a 73 y.o. who presents for a Medicare Wellness preventive visit.  As a reminder, Annual Wellness Visits don't include a physical exam, and some assessments may be limited, especially if this visit is performed virtually. We may recommend an in-person follow-up visit with your provider if needed.  Visit Complete: Virtual I connected with  Jose Strong on 08/15/23 by a audio enabled telemedicine application and verified that I am speaking with the correct person using two identifiers.  Patient Location: Home  Provider Location: Home Office  I discussed the limitations of evaluation and management by telemedicine. The patient expressed understanding and agreed to proceed.  Vital Signs: Because this visit was a virtual/telehealth visit, some criteria may be missing or patient reported. Any vitals not documented were not able to be obtained and vitals that have been documented are patient reported.  VideoDeclined- This patient declined Librarian, academic. Therefore the visit was completed with audio only.  Persons Participating in Visit: Patient.  AWV Questionnaire: Yes: Patient Medicare  AWV questionnaire was completed by the patient on 08/08/2023; I have confirmed that all information answered by patient is correct and no changes since this date.  Cardiac Risk Factors include: advanced age (>70men, >63 women);male gender;hypertension;dyslipidemia     Objective:     Today's Vitals   08/15/23 0900  BP: 130/88  Weight: 172 lb (78 kg)  Height: 5\' 9"  (1.753 m)   Body mass index is 25.4 kg/m.     08/15/2023    9:04 AM 08/08/2022    8:23 AM 11/06/2021   10:19 AM 08/02/2021    9:45 AM 07/21/2020    9:46 AM 06/16/2019   10:19 AM 04/03/2014    2:11 PM  Advanced Directives  Does Patient Have a Medical Advance Directive? No Yes Yes Yes Yes No No  Type of Special educational needs teacher of [place];Living will Healthcare Power of [place];Living will Healthcare Power of [employer] of [place];Living will    Copy of Healthcare Power of Attorney in Chart?  No - copy requested No - copy requested No - copy requested     Would patient like information on creating a medical advance directive? No - Patient declined     No - Patient declined No - patient declined information    Current Medications (verified) Outpatient Encounter Medications as of 08/15/2023  Medication Sig   Calcium -Magnesium-Zinc 167-83-8 MG TABS Take 1 tablet by mouth daily.   carvedilol  (COREG ) 12.5 MG tablet Take 2 tablets (25 mg total) by mouth 2 (two) times daily with a meal.   Garlic (GARLIQUE PO) Take 1 capsule by mouth daily.   loratadine  (CLARITIN ) 10 MG tablet Take 1 tablet (10 mg total) by mouth daily.   Multiple  Vitamin (MULTIVITAMIN) tablet Take 1 tablet by mouth daily.   Omega-3 Fatty Acids (FISH OIL ) 1000 MG CPDR 3000 mg by mouth twice daily.   sildenafil  (VIAGRA ) 50 MG tablet 1-2 tabs by mouth once daily as needed for ED   vitamin E 180 MG (400 UNITS) capsule Take 400 Units by mouth daily.   No facility-administered encounter medications on file as of 08/15/2023.    Allergies  (verified) Advil [ibuprofen] and Amlodipine    History: Past Medical History:  Diagnosis Date   Allergy    Cancer (HCC) 08-23-2009   melanoma --neck   COVID-19 virus infection 02/25/2022   Essential hypertension 05/20/2018   Hyperlipidemia    Malignant melanoma (HCC) 05/19/2020   stage 1 A, removed by Dr. Cherylin Corrigan and refered to Maurice Sousa for wide excision   Melanoma [facility]) 2020/08/23   Past Surgical History:  Procedure Laterality Date   HERNIA REPAIR     INGUINAL HERNIA REPAIR N/A 11/09/2021   Procedure: BILATERAL INGUINAL HERNIA REPAIR WITH MESH;  Surgeon: Caralyn Chandler, MD;  Location: [facility] OR;  Service: General;  Laterality: N/A;  GENERAL AND TAP BLOCK   MOHS SURGERY  03/12/2009   MOHS SURGERY Right 07/2020   non-invasive melanoma   TONSILLECTOMY  03/12/1962   TONSILLECTOMY     UMBILICAL HERNIA REPAIR N/A 11/09/2021   Procedure: HERNIA REPAIR UMBILICAL ADULT WITH MESH;  Surgeon: Caralyn Chandler, MD;  Location: [facility] OR;  Service: General;  Laterality: N/A;  GENERAL AND TAP BLOCK   Family History  Problem Relation Age of Onset   COPD Mother    Angina Mother    Social History   Socioeconomic History   Marital status: Widowed    Spouse name: Not on file   Number of children: 0   Years of education: Not on file   Highest education level: Some college, no degree  Occupational History   Not on file  Tobacco Use   Smoking status: Former    Current packs/day: 0.00    Average packs/day: 0.5 packs/day for 20.0 years (10.0 ttl pk-yrs)    Types: Cigarettes    Start date: 06/28/1970    Quit date: 06/28/1990    Years since quitting: 33.1   Smokeless tobacco: Never  Vaping Use   Vaping status: Never Used  Substance and Sexual Activity   Alcohol use: Yes    Alcohol/week: 12.0 standard drinks of alcohol    Types: 12 Cans of beer per week   Drug use: Never   Sexual activity: Yes    Partners: Female  Other Topics Concern   Not on file  Social History Narrative   Wife died 2014/08/24, had  cancer   Unemployed    Completed HS   Not working   Has worked in Therapist, 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     Social Drivers of Corporate investment banker Strain: Low Risk  (08/15/2023)   Overall Financial Resource Strain (CARDIA)    Difficulty of Paying Living Expenses: Not very hard  Food Insecurity: Food Insecurity Present (08/15/2023)   Hunger Vital Sign    Worried About Running Out of Food in the Last Year: Sometimes true    Ran Out of Food in the Last Year: Never true  Transportation Needs: No Transportation Needs (08/15/2023)   PRAPARE - Administrator, Civil Service (Medical): No    Lack of Transportation (Non-Medical):  No  Physical Activity: Insufficiently Active (08/15/2023)   Exercise Vital Sign    Days of Exercise per Week: 2 days    Minutes of Exercise per Session: 40 min  Stress: No Stress Concern Present (08/15/2023)   [employer] of Occupational Health - Occupational Stress Questionnaire    Feeling of Stress : Not at all  Social Connections: Moderately Integrated (08/15/2023)   Social Connection and Isolation Panel [NHANES]    Frequency of Communication with Friends and Family: Twice a week    Frequency of Social Gatherings with Friends and Family: Once a week    Attends Religious Services: 1 to 4 times per year    Active Member of [employer] or Organizations: No    Attends Engineer, structural: More than 4 times per year    Marital Status: Widowed    Tobacco Counseling Counseling given: Not Answered    Clinical Intake:  Pre-visit preparation completed: Yes  Pain : No/denies pain     BMI - recorded: 25.4 Nutritional Status: BMI 25 -29 Overweight Nutritional Risks: Non-healing wound Diabetes: No  Lab Results  Component Value Date   HGBA1C 5.2 06/19/2022   HGBA1C 5.4 01/02/2017     How often do you need to have someone help you when you read instructions, pamphlets, or  other written materials from your doctor or pharmacy?: 1 - Never What is the last grade level you completed in school?: HS graduate  Interpreter Needed?: No  Information entered by :: Juliann Ochoa   Activities of Daily Living     08/08/2023    9:32 PM  In your present state of health, do you have any difficulty performing the following activities:  Hearing? 0  Vision? 0  Difficulty concentrating or making decisions? 0  Walking or climbing stairs? 0  Dressing or bathing? 0  Doing errands, shopping? 0  Preparing Food and eating ? N  Using the Toilet? N  In the past six months, have you accidently leaked urine? N  Do you have problems with loss of bowel control? N  Managing your Medications? N  Managing your Finances? N  Housekeeping or managing your Housekeeping? N    Patient Care Team: O'Sullivan, Melissa, NP as PCP - General (Internal Medicine)  I have updated your Care Teams any recent Medical Services you may have received from other providers in the past year.     Assessment:    This is a routine wellness examination for Jose Strong.  Hearing/Vision screen Hearing Screening - Comments:: Patient has no hearing difficulties Vision Screening - Comments:: No vision difficulties. Patient wears readers   Goals Addressed             This Visit's Progress    Patient Stated       To get his bill paid off       Depression Screen     08/15/2023    9:05 AM 08/08/2022    8:23 AM 06/19/2022    9:46 AM 08/02/2021    9:44 AM 07/21/2020    9:50 AM 06/16/2019   10:22 AM 05/19/2019    8:44 AM  PHQ 2/9 Scores  PHQ - 2 Score 0 0 0 0 0 0 0  PHQ- 9 Score 0          Fall Risk     08/08/2023    9:32 PM 08/07/2022   10:47 AM 06/19/2022    9:46 AM 08/02/2021    9:46 AM 07/21/2020    9:48  AM  Fall Risk   Falls in the past year? 0 0 0 0 0  Number falls in past yr: 0 0 0 0 0  Injury with Fall? 0 0 0 0 0  Risk for fall due to : No Fall Risks No Fall Risks No Fall Risks Impaired  vision   Follow up Falls evaluation completed Falls evaluation completed Falls evaluation completed Falls prevention discussed Falls prevention discussed    MEDICARE RISK AT HOME:  Medicare Risk at Home Any stairs in or around the home?: (Patient-Rptd) Yes If so, are there any without handrails?: (Patient-Rptd) No Home free of loose throw rugs in walkways, pet beds, electrical cords, etc?: (Patient-Rptd) Yes Adequate lighting in your home to reduce risk of falls?: (Patient-Rptd) Yes Life alert?: (Patient-Rptd) No Use of a cane, walker or w/c?: (Patient-Rptd) No Grab bars in the bathroom?: (Patient-Rptd) Yes Shower chair or bench in shower?: (Patient-Rptd) No Elevated toilet seat or a handicapped toilet?: (Patient-Rptd) No  TIMED UP AND GO:  Was the test performed?  No  Cognitive Function: 6CIT completed        08/15/2023    9:03 AM 08/08/2022    8:30 AM 08/02/2021    9:47 AM  6CIT Screen  What Year? 0 points 0 points 0 points  What month? 0 points 0 points 0 points  What time? 0 points 0 points 0 points  Count back from 20 0 points 0 points 0 points  Months in reverse 0 points 0 points 0 points  Repeat phrase 0 points 2 points 2 points  Total Score 0 points 2 points 2 points    Immunizations Immunization History  Administered Date(s) Administered   Fluad Quad(high Dose 65+) 11/18/2018, 11/24/2019, 12/06/2020, 12/15/2021   Fluad Trivalent(High Dose 65+) 12/19/2022   Influenza, High Dose Seasonal PF 01/08/2018   Influenza,inj,Quad PF,6+ Mos 12/08/2012, 12/08/2013, 12/27/2015, 12/31/2016   PFIZER(Purple Top)SARS-COV-2 Vaccination 05/27/2019, 06/17/2019, 02/09/2020   Pfizer(Comirnaty )Fall Seasonal Vaccine 12 years and older 01/22/2022, 02/27/2023   Pneumococcal Conjugate-13 01/08/2018   Pneumococcal Polysaccharide-23 11/24/2019   Td 02/10/2016   Tdap 03/12/2006   Zoster, Live 12/08/2013    Screening Tests Health Maintenance  Topic Date Due   Zoster Vaccines- Shingrix  (1 of 2) 09/24/1971   COVID-19 Vaccine (6 - Pfizer risk 2024-25 season) 08/28/2023   INFLUENZA VACCINE  10/11/2023   Fecal DNA (Cologuard)  05/23/2024   Medicare Annual Wellness (AWV)  08/14/2024   DTaP/Tdap/Td (3 - Td or Tdap) 02/09/2026   Pneumonia Vaccine 96+ Years old  Completed   Hepatitis C Screening  Completed   HPV VACCINES  Aged Out   Meningococcal B Vaccine  Aged Out    Health Maintenance  Health Maintenance Due  Topic Date Due   Zoster Vaccines- Shingrix (1 of 2) 09/24/1971   Health Maintenance Items Addressed: Declined shingles  Additional Screening:  Vision Screening: Recommended annual ophthalmology exams for early detection of glaucoma and other disorders of the eye. Would you like a referral to an eye doctor? No    Dental Screening: Recommended annual dental exams for proper oral hygiene  Community Resource Referral / Chronic Care Management: CRR required this visit?  No   CCM required this visit?  No   Plan:    I have personally reviewed and noted the following in the patient's chart:   Medical and social history Use of alcohol, tobacco or illicit drugs  Current medications and supplements including opioid prescriptions. Patient is not currently taking opioid prescriptions.  Functional ability and status Nutritional status Physical activity Advanced directives List of other physicians Hospitalizations, surgeries, and ER visits in previous 12 months Vitals Screenings to include cognitive, depression, and falls Referrals and appointments  In addition, I have reviewed and discussed with patient certain preventive protocols, quality metrics, and best practice recommendations. A written personalized care plan for preventive services as well as general preventive health recommendations were provided to patient.   Jose Strong, [place]   08/15/2023   After Visit Summary: (MyChart) Due to this being a telephonic visit, the after visit summary with  patients personalized plan was offered to patient via MyChart   Notes: Nothing significant to report at this time.

## 2023-08-30 ENCOUNTER — Telehealth: Payer: Self-pay | Admitting: Family

## 2023-08-30 ENCOUNTER — Telehealth: Payer: Self-pay | Admitting: Neurology

## 2023-08-30 ENCOUNTER — Ambulatory Visit: Payer: Self-pay | Admitting: Family

## 2023-08-30 ENCOUNTER — Ambulatory Visit (HOSPITAL_BASED_OUTPATIENT_CLINIC_OR_DEPARTMENT_OTHER)
Admission: RE | Admit: 2023-08-30 | Discharge: 2023-08-30 | Disposition: A | Discharge status: Home / Self Care | Source: Ambulatory Visit | Attending: Family | Admitting: Family

## 2023-08-30 ENCOUNTER — Encounter: Payer: Self-pay | Admitting: Family Medicine

## 2023-08-30 ENCOUNTER — Other Ambulatory Visit (HOSPITAL_BASED_OUTPATIENT_CLINIC_OR_DEPARTMENT_OTHER): Payer: Self-pay

## 2023-08-30 ENCOUNTER — Ambulatory Visit: Admitting: Family

## 2023-08-30 VITALS — BP 116/78 | HR 80 | Temp 98.6°F | Resp 16 | Ht 69.0 in | Wt 169.0 lb

## 2023-08-30 DIAGNOSIS — R109 Unspecified abdominal pain: Secondary | ICD-10-CM | POA: Diagnosis not present

## 2023-08-30 DIAGNOSIS — R1030 Lower abdominal pain, unspecified: Secondary | ICD-10-CM | POA: Insufficient documentation

## 2023-08-30 DIAGNOSIS — K5792 Diverticulitis of intestine, part unspecified, without perforation or abscess without bleeding: Secondary | ICD-10-CM

## 2023-08-30 DIAGNOSIS — K5732 Diverticulitis of large intestine without perforation or abscess without bleeding: Secondary | ICD-10-CM | POA: Diagnosis not present

## 2023-08-30 DIAGNOSIS — I10 Essential (primary) hypertension: Secondary | ICD-10-CM

## 2023-08-30 LAB — CBC WITH DIFFERENTIAL/PLATELET
Basophils Absolute: 0.1 10*3/uL (ref 0.0–0.1)
Basophils Relative: 0.7 % (ref 0.0–3.0)
Eosinophils Absolute: 0.3 10*3/uL (ref 0.0–0.7)
Eosinophils Relative: 1.7 % (ref 0.0–5.0)
HCT: 47.4 % (ref 39.0–52.0)
Hemoglobin: 16 g/dL (ref 13.0–17.0)
Lymphocytes Relative: 14.5 % (ref 12.0–46.0)
Lymphs Abs: 2.6 10*3/uL (ref 0.7–4.0)
MCHC: 33.8 g/dL (ref 30.0–36.0)
MCV: 91.5 fl (ref 78.0–100.0)
Monocytes Absolute: 0.7 10*3/uL (ref 0.1–1.0)
Monocytes Relative: 4.2 % (ref 3.0–12.0)
Neutro Abs: 14.2 10*3/uL — ABNORMAL HIGH (ref 1.4–7.7)
Neutrophils Relative %: 78.9 % — ABNORMAL HIGH (ref 43.0–77.0)
Platelets: 224 10*3/uL (ref 150.0–400.0)
RBC: 5.19 Mil/uL (ref 4.22–5.81)
RDW: 13.4 % (ref 11.5–15.5)
WBC: 18 10*3/uL (ref 4.0–10.5)

## 2023-08-30 LAB — POC URINALSYSI DIPSTICK (AUTOMATED)
Bilirubin, UA: NEGATIVE
Glucose, UA: NEGATIVE
Ketones, UA: NEGATIVE
Nitrite, UA: NEGATIVE
Protein, UA: NEGATIVE
Spec Grav, UA: <=1.005 — AB (ref 1.010–1.025)
Urobilinogen, UA: 0.2 U/dL
pH, UA: 6 (ref 5.0–8.0)

## 2023-08-30 LAB — BASIC METABOLIC PANEL WITH GFR
BUN: 11 mg/dL (ref 6–23)
CO2: 29 meq/L (ref 19–32)
Calcium: 9.5 mg/dL (ref 8.4–10.5)
Chloride: 102 meq/L (ref 96–112)
Creatinine, Ser: 0.79 mg/dL (ref 0.40–1.50)
GFR: 89.74 mL/min (ref >60.00)
Glucose, Bld: 147 mg/dL — ABNORMAL HIGH (ref 70–99)
Potassium: 4.2 meq/L (ref 3.5–5.1)
Sodium: 139 meq/L (ref 135–145)

## 2023-08-30 MED ORDER — BARIUM SULFATE 2 % PO SUSP
450.0000 mL | Freq: Once | ORAL | Status: AC
  Administered 2023-08-30: 450 mL via ORAL

## 2023-08-30 MED ORDER — TRAMADOL HCL 50 MG PO TABS
50.0000 mg | ORAL_TABLET | Freq: Three times a day (TID) | ORAL | 0 refills | Status: DC | PRN
  Filled 2023-08-30: qty 10, 4d supply, fill #0

## 2023-08-30 MED ORDER — AMOXICILLIN-POT CLAVULANATE 875-125 MG PO TABS: 1.0000 | ORAL_TABLET | Freq: Three times a day (TID) | ORAL | 0 refills | Status: AC

## 2023-08-30 MED ORDER — AMOXICILLIN-POT CLAVULANATE 875-125 MG PO TABS: 1.0000 | ORAL_TABLET | Freq: Two times a day (BID) | ORAL | 0 refills | Status: DC

## 2023-08-30 MED ORDER — IOHEXOL 300 MG/ML  SOLN
100.0000 mL | Freq: Once | INTRAMUSCULAR | Status: AC | PRN
  Administered 2023-08-30: 100 mL via INTRAVENOUS

## 2023-08-30 MED ORDER — TRAMADOL HCL 50 MG PO TABS: 50.0000 mg | ORAL_TABLET | Freq: Three times a day (TID) | ORAL | 0 refills | Status: DC | PRN

## 2023-08-30 NOTE — Assessment & Plan Note (Signed)
  Hypertension well-controlled with medication and lifestyle changes. Occasional orthostatic dizziness noted. We discussed cutting back on his carvedilol  dose but he wishes to hold off on this for now.  - Continue current medication regimen. - Monitor blood pressure and dizziness. - Reassess medication dosage if dizziness persists.

## 2023-08-30 NOTE — Telephone Encounter (Signed)
 CRITICAL VALUE STICKER  CRITICAL VALUE: Wbc [id number]  RECEIVER (on-site recipient of call): Kanisha Duba  DATE & TIME NOTIFIED: 08/30/2023 at 11:54 am  MESSENGER (representative from lab):Norberta Beans

## 2023-08-30 NOTE — Patient Instructions (Signed)
 VISIT SUMMARY:  You visited us  today due to severe abdominal pain that has been ongoing since Monday. We discussed your symptoms, including the location and intensity of the pain, and your current management of hypertension.  YOUR PLAN:  ABDOMINAL PAIN: You have been experiencing severe lower abdominal pain, which might be due to a bladder infection or a gastrointestinal issue. -We will perform a CT scan of your abdomen to get a clearer picture of what might be causing the pain. -Please provide a urine sample for urinalysis and possible culture. -We have prescribed tramadol to help manage your pain. -Remember to pick up the contrast material for your CT scan.  HYPERTENSION: Your blood pressure is well-controlled with your current medication and lifestyle changes, though you occasionally experience dizziness when standing up. -Continue taking your current blood pressure medication as prescribed. -Keep monitoring your blood pressure and note any episodes of dizziness. -We will reassess your medication dosage if the dizziness continues.

## 2023-08-30 NOTE — Telephone Encounter (Signed)
 Can you please cancel tramadol rx at [facility]?tks

## 2023-08-30 NOTE — Telephone Encounter (Signed)
 Spoke to pt re: diverticulitis.  Patient reports feeling some better. Advised pt to start augmentin TID beginning tonight.  Recommend clear liquid diet. Go to ER if worsening pain, fever >101.  Follow up with me on Tuesday.  Jose Strong, can you call him to schedule this appointment please?

## 2023-08-30 NOTE — Assessment & Plan Note (Addendum)
 New.  Obtain UA/Culture, CBC, Cmet.  I would like to obtain a stat CT abd/pelvis for further evaluation. Differential includes appendicitis, gastroenteritis, kidney stone, hernia, diverticulitis.  Add Tramadol prn pain.

## 2023-08-30 NOTE — Progress Notes (Signed)
 Subjective:     Patient ID: Jose Strong, male    DOB: Jul 22, 1950, 73 y.o.   MRN: [id number]  Chief Complaint  Patient presents with   Abdominal Pain    Patient complains of lower abdominal pain     Abdominal Pain    Discussed the use of AI scribe software for clinical note transcription with the patient, who gave verbal consent to proceed.  History of Present Illness  Jose Strong is a 73 year old male who presents with abdominal pain.  He experiences severe abdominal pain since Monday, located in the area of previous hernia surgery. The pain is exacerbated by getting up and relieved by movement. Aspirin  does not alleviate the pain. Two months ago, he had similar pain after eating a questionable chicken sandwich and drinking beer, lasting about a week. Current pain intensity ranges from 5 to 8 out of 10. Sitting and watching TV helps manage the pain. There is tenderness in the lower abdomen with pressure, but no upper abdominal pain or bowel movement issues.  He has no urinary issues, including nocturia, and has been hydrating well. He manages his blood pressure by reducing coffee intake and taking medication upon waking, resulting in improved readings. He occasionally experiences dizziness when standing, likely due to carvedilol , taken at 12.5 mg twice daily. He is retired, living at home, and recently spent time at his girlfriend's house.     Health Maintenance Due  Topic Date Due   Zoster Vaccines- Shingrix (1 of 2) 09/24/1971   COVID-19 Vaccine (6 - Pfizer risk 2024-25 season) 08/28/2023    Past Medical History:  Diagnosis Date   Allergy    Cancer (HCC) 2011   melanoma --neck   COVID-19 virus infection 02/25/2022   Essential hypertension 05/20/2018   Hyperlipidemia    Malignant melanoma (HCC) 05/19/2020   stage 1 A, removed by Dr. Cherylin Corrigan and refered to Maurice Sousa for wide excision   Melanoma [facility]) 2022    Past Surgical History:  Procedure  Laterality Date   HERNIA REPAIR     INGUINAL HERNIA REPAIR N/A 11/09/2021   Procedure: BILATERAL INGUINAL HERNIA REPAIR WITH MESH;  Surgeon: Caralyn Chandler, MD;  Location: [facility] OR;  Service: General;  Laterality: N/A;  GENERAL AND TAP BLOCK   MOHS SURGERY  03/12/2009   MOHS SURGERY Right 07/2020   non-invasive melanoma   TONSILLECTOMY  03/12/1962   TONSILLECTOMY     UMBILICAL HERNIA REPAIR N/A 11/09/2021   Procedure: HERNIA REPAIR UMBILICAL ADULT WITH MESH;  Surgeon: Caralyn Chandler, MD;  Location: [facility] OR;  Service: General;  Laterality: N/A;  GENERAL AND TAP BLOCK    Family History  Problem Relation Age of Onset   COPD Mother    Angina Mother     Social History   Socioeconomic History   Marital status: Widowed    Spouse name: Not on file   Number of children: 0   Years of education: Not on file   Highest education level: 12th grade  Occupational History   Not on file  Tobacco Use   Smoking status: Former    Current packs/day: 0.00    Average packs/day: 0.5 packs/day for 20.0 years (10.0 ttl pk-yrs)    Types: Cigarettes    Start date: 06/28/1970    Quit date: 06/28/1990    Years since quitting: 33.1   Smokeless tobacco: Never  Vaping Use   Vaping status: Never Used  Substance and Sexual Activity  Alcohol use: Yes    Alcohol/week: 12.0 standard drinks of alcohol    Types: 12 Cans of beer per week   Drug use: Never   Sexual activity: Yes    Partners: Female  Other Topics Concern   Not on file  Social History Narrative   Wife died 09/28/2014, had cancer   Unemployed    Completed HS   Not working   Has worked in Therapist, 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     Social Drivers of Corporate investment banker Strain: Low Risk  (08/23/2023)   Overall Financial Resource Strain (CARDIA)    Difficulty of Paying Living Expenses: Not very hard  Food Insecurity: Food Insecurity Present (08/23/2023)   Hunger Vital Sign     Worried About Running Out of Food in the Last Year: Sometimes true    Ran Out of Food in the Last Year: Sometimes true  Transportation Needs: No Transportation Needs (08/23/2023)   PRAPARE - Administrator, Civil Service (Medical): No    Lack of Transportation (Non-Medical): No  Physical Activity: Insufficiently Active (08/23/2023)   Exercise Vital Sign    Days of Exercise per Week: 2 days    Minutes of Exercise per Session: 50 min  Stress: No Stress Concern Present (08/23/2023)   [employer] of Occupational Health - Occupational Stress Questionnaire    Feeling of Stress: Not at all  Social Connections: Moderately Isolated (08/23/2023)   Social Connection and Isolation Panel    Frequency of Communication with Friends and Family: Twice a week    Frequency of Social Gatherings with Friends and Family: Twice a week    Attends Religious Services: 1 to 4 times per year    Active Member of [employer] or Organizations: No    Attends Banker Meetings: Not on file    Marital Status: Widowed  Intimate Partner Violence: Not At Risk (08/15/2023)   Humiliation, Afraid, Rape, and Kick questionnaire    Fear of Current or Ex-Partner: No    Emotionally Abused: No    Physically Abused: No    Sexually Abused: No    Outpatient Medications Prior to Visit  Medication Sig Dispense Refill   Calcium -Magnesium-Zinc 167-83-8 MG TABS Take 1 tablet by mouth daily.     carvedilol  (COREG ) 12.5 MG tablet Take 2 tablets (25 mg total) by mouth 2 (two) times daily with a meal.     Garlic (GARLIQUE PO) Take 1 capsule by mouth daily.     loratadine  (CLARITIN ) 10 MG tablet Take 1 tablet (10 mg total) by mouth daily. 90 tablet 4   Multiple Vitamin (MULTIVITAMIN) tablet Take 1 tablet by mouth daily.     Omega-3 Fatty Acids (FISH OIL ) 1000 MG CPDR 3000 mg by mouth twice daily.     sildenafil  (VIAGRA ) 50 MG tablet 1-2 tabs by mouth once daily as needed for ED 10 tablet 5   vitamin E 180 MG (400 UNITS)  capsule Take 400 Units by mouth daily.     No facility-administered medications prior to visit.    Allergies  Allergen Reactions   Advil [Ibuprofen] Other (See Comments)    Upset Stomach   Amlodipine  Rash    Review of Systems  Gastrointestinal:  Positive for abdominal pain.       Objective:    Physical Exam Constitutional:      General: He is not in acute distress.  Appearance: He is well-developed.  HENT:     Head: Normocephalic and atraumatic.   Cardiovascular:     Rate and Rhythm: Normal rate and regular rhythm.     Heart sounds: No murmur heard. Pulmonary:     Effort: Pulmonary effort is normal. No respiratory distress.     Breath sounds: Normal breath sounds. No wheezing or rales.  Abdominal:     General: Bowel sounds are decreased.     Tenderness: There is abdominal tenderness in the right lower quadrant, suprapubic area and left lower quadrant. There is no guarding.     Comments: Mild abdominal distension   Skin:    General: Skin is warm and dry.   Neurological:     Mental Status: He is alert and oriented to person, place, and time.   Psychiatric:        Behavior: Behavior normal.        Thought Content: Thought content normal.      BP 116/78 (BP Location: Right Arm, Patient Position: Sitting, Cuff Size: Small)   Pulse 80   Temp 98.6 F (37 C) (Oral)   Resp 16   Ht 5' 9 (1.753 m)   Wt 169 lb (76.7 kg)   SpO2 97%   BMI 24.96 kg/m  Wt Readings from Last 3 Encounters:  08/30/23 169 lb (76.7 kg)  08/15/23 172 lb (78 kg)  07/31/23 172 lb (78 kg)       Assessment & Plan:   Problem List Items Addressed This Visit       Unprioritized   Lower abdominal pain - Primary   New.  Obtain UA/Culture, CBC, Cmet.  I would like to obtain a stat CT abd/pelvis for further evaluation. Differential includes appendicitis, gastroenteritis, kidney stone, hernia, diverticulitis.  Add Tramadol prn pain.      Relevant Medications   traMADol (ULTRAM) 50 MG  tablet   Other Relevant Orders   CT ABDOMEN PELVIS W CONTRAST   Basic Metabolic Panel (BMET)   CBC w/Diff   POCT Urinalysis Dipstick (Automated) (Completed)   Urine Culture   Essential hypertension    Hypertension well-controlled with medication and lifestyle changes. Occasional orthostatic dizziness noted. We discussed cutting back on his carvedilol  dose but he wishes to hold off on this for now.  - Continue current medication regimen. - Monitor blood pressure and dizziness. - Reassess medication dosage if dizziness persists.       I am having Jose Strong maintain his multivitamin, Calcium -Magnesium-Zinc, Fish Oil , vitamin E, Garlic (GARLIQUE PO), loratadine , sildenafil , carvedilol , and traMADol.  Meds ordered this encounter  Medications   DISCONTD: traMADol (ULTRAM) 50 MG tablet    Sig: Take 1 tablet (50 mg total) by mouth every 8 (eight) hours as needed for up to 3 days.    Dispense:  10 tablet    Refill:  0    Supervising Provider:   Randie Bustle A [[id number]]   traMADol (ULTRAM) 50 MG tablet    Sig: Take 1 tablet (50 mg total) by mouth every 8 (eight) hours as needed for up to 3 days.    Dispense:  10 tablet    Refill:  0    Supervising Provider:   Randie Bustle A [[id number]]

## 2023-08-31 LAB — URINE CULTURE
MICRO NUMBER:: 16606054
Result:: NO GROWTH
SPECIMEN QUALITY:: ADEQUATE

## 2023-09-02 ENCOUNTER — Telehealth: Payer: Self-pay

## 2023-09-02 ENCOUNTER — Encounter: Payer: Self-pay | Admitting: Family

## 2023-09-02 NOTE — Telephone Encounter (Signed)
 Initial Comment Urgent report- CT Abdomen and pelvis Translation No Nurse Assessment Nurse: [name], RN, Hadassah Date/Time (Eastern Time): 08/30/2023 6:17:26 PM You have opened lab assessment, do you wish to continue? ---Yes Is there an on-call provider listed? ---Yes Please list name of person reporting value (Lab Employee) and a contact number. ---Name of person reporting the lab: Contact Number: [phone] Please document the following items: Lab name Lab value (read back to lab to verify) Reference range for lab value Date and time blood was drawn Collect time of birth for bilirubin results ---Lab: CT abdomen and pelvis Lab results: severe/ acute diverticulitis of the proximal sigmoid colon. NO perforation or abscesses seen Date and Time CT was done: 08/30/23 1603 Ordering MD: Dr. Eleanor Ponto. Please collect the patient contact information from the lab. (name, phone number and address) ---Patients Name: Jose Strong Phone Number: [phone] Address: [address] [place], [place], [place], [address] List any special notes provided by lab. ---None Disp. Time Titus Time) Disposition Final User 08/30/2023 7:28:33 PM Paged On Call back to [facility], RNHadassah 08/30/2023 7:54:03 PM Called On-Call Provider McDonald, RN, Hadassah 08/30/2023 7:55:37 PM Paged On Call back to [facility], RN, Hadassah 08/30/2023 7:56:02 PM Lab Call [name], RN, Hadassah MASON NOTE: All timestamps contained within this report are represented as [place] Standard Time. CONFIDENTIALTY NOTICE: This fax transmission is intended only for the addressee. It contains information that is legally privileged, confidential or otherwise protected from use or disclosure. If you are not the intended recipient, you are strictly prohibited from reviewing, disclosing, copying using or disseminating any of this information or taking any action in reliance on or regarding this information. If you have received this  fax in error, please notify us  immediately by telephone so that we can arrange for its return to us . Phone: [phone], Toll-Free: [phone], Fax: [phone] Jose Strong 1950-04-11 Page: 1 of3 CallId: [id number] Disp. Time Titus Time) Disposition Final User Reason: Called lab to get results 08/30/2023 7:56:09 PM Clinical Call McDonald, RN, Hadassah 08/30/2023 7:56:16 PM Attempt made - line busy McDonald, RN, Hadassah 08/30/2023 7:58:31 PM Paged On Call back to [facility], RN, Hadassah 08/30/2023 8:16:03 PM Called On-Call Provider McDonald, RN, Hadassah 08/30/2023 8:21:58 PM FINAL ATTEMPT MADE - message left [name], RN, Hadassah 08/30/2023 8:23:27 PM Attempt made - message left [name], RN, Hadassah 08/30/2023 8:29:52 PM Send To RN Personal McDonald, RN, Hadassah 08/30/2023 8:35:39 PM Attempt made - message left [name], RN, [name] 08/30/2023 8:37:24 PM Attempt made - message left [name] 08/30/2023 9:07:45 PM FINAL ATTEMPT MADE - message left Yes [name] 08/30/2023 9:10:01 PM Called On-Call Provider [name], RN, [name] Final Disposition 08/30/2023 9:07:45 PM FINAL ATTEMPT MADE - message left Yes [name], RN, [name] Comments User: Hadassah [name], RN Date/Time Titus Time): 08/30/2023 7:40:09 PM attempted to call patient about critical CT results per MD instructions. Number for patient on file goes to a dead line. Will message [facility] to verify number, if not will message on-call back to let them know I am unable to reach patient. User: [name] Date/Time Titus Time): 08/30/2023 7:48:18 PM Listened to call and verified lab call back number. User: Hadassah [name], RN Date/Time Titus Time): 08/30/2023 7:50:47 PM Verified that number listed as pts number is what we have on file. Will page oncall MD to state that i am unable to reach patient. User: Hadassah [name], RN Date/Time Titus Time): 08/30/2023 8:23:13 PM Accidentally charted final attempt  -message left at  1922 (CST). Final attempt was NOT made. User: [name] Solian, RN Date/Time Titus Time): 08/30/2023 9:08:39 PM Several attempts made and voicemails left for patient to call office and speak with triage nurse regarding the medication OCP is attempting to have patient take. No answer

## 2023-09-02 NOTE — Telephone Encounter (Signed)
Rx cancelled at [facility]

## 2023-09-02 NOTE — Telephone Encounter (Signed)
 Initial Comment Caller states he received a phone call on his answering machine to call a triage nurse about his medication. Translation No Nurse Assessment Nurse: Charleen, RN, [name] Date/Time Titus Time): 08/30/2023 11:35:13 PM Confirm and document reason for call. If symptomatic, describe symptoms. ---caller reports that he had a message on his answering machine to call triage nurse, had an mri today. was told he has an infection and needed an prescription went to the pharmacy and pharmacy reported that there were too many pills prescribed. medication was filled for 2 a day instead of 3 a day as recommended. amoxicillin  was given take 2 daily. but got it filled for 2 a day. Does the patient have any new or worsening symptoms? ---No Disp. Time Titus Time) Disposition Final User 08/30/2023 11:42:16 PM Clinical Call Yes Charleen RN, [name] Final Disposition 08/30/2023 11:42:16 PM Clinical Call Yes Charleen, RN, [name] Comments User: [name] Charleen, RN Date/Time Titus Time): 08/30/2023 11:42:12 PM unable to locate the record about the call and pt unable to tell why they called but has no new symptoms

## 2023-09-02 NOTE — Telephone Encounter (Signed)
 Provider will contact pharmacy about rx

## 2023-09-02 NOTE — Telephone Encounter (Signed)
 Pt is scheduled for tomorrow

## 2023-09-02 NOTE — Telephone Encounter (Signed)
 Pcp received notification 6/20

## 2023-09-02 NOTE — Telephone Encounter (Signed)
 My instructions were for Augmentin  TID x 1 week and I requested that they dispense 21 tablets.  Can you please verify with pharmacy that is what they gave him and if not please make sure that they dispense a total of 21 pills.  Please update pt as well.

## 2023-09-02 NOTE — Telephone Encounter (Signed)
 Pharmacy filled rx from another provider for BID 875mg . Uptodate recommendation is TID.  Left voicemail for patient OK to take TID using what he has and I will see him tomorrow.  Can we get hm in tomorrow or Wednesday.

## 2023-09-02 NOTE — Telephone Encounter (Signed)
 Provider aware of results, spoke with patient

## 2023-09-03 ENCOUNTER — Other Ambulatory Visit (HOSPITAL_BASED_OUTPATIENT_CLINIC_OR_DEPARTMENT_OTHER): Payer: Self-pay

## 2023-09-03 ENCOUNTER — Ambulatory Visit (INDEPENDENT_AMBULATORY_CARE_PROVIDER_SITE_OTHER): Admitting: Family

## 2023-09-03 DIAGNOSIS — K5792 Diverticulitis of intestine, part unspecified, without perforation or abscess without bleeding: Secondary | ICD-10-CM | POA: Diagnosis not present

## 2023-09-03 MED ORDER — TRAMADOL HCL 50 MG PO TABS
50.0000 mg | ORAL_TABLET | Freq: Four times a day (QID) | ORAL | 0 refills | Status: AC | PRN
Start: 2023-09-03 — End: 2023-09-07
  Filled 2023-09-03: qty 15, 4d supply, fill #0

## 2023-09-03 MED ORDER — TRAMADOL HCL 50 MG PO TABS
50.0000 mg | ORAL_TABLET | Freq: Four times a day (QID) | ORAL | 0 refills | Status: DC | PRN
Start: 2023-09-03 — End: 2023-09-03

## 2023-09-03 NOTE — Progress Notes (Signed)
 Subjective:     Patient ID: Jose Strong, male    DOB: [birth year], 73 y.o.   MRN: [id number]  Chief Complaint  Patient presents with   Follow-up    HPI  Discussed the use of AI scribe software for clinical note transcription with the patient, who gave verbal consent to proceed.  History of Present Illness  Jose Strong is a 73 year old male with diverticulitis who presents for follow-up of abdominal pain. He is accompanied by his significant other Nena.  Abdominal pain - Significant improvement in lower abdominal pain with Augmentin  therapy - Persistent localized tenderness remains in the lower abdomen - Discomfort increases by noon if a dose of tramadol  is missed - Pain is managed with tramadol  and acetaminophen   Antibiotic therapy - Augmentin  prescribed for diverticulitis - Initial prescription was for three times daily dosing, but currently taking twice daily per pharmacist recommendation to minimize gastrointestinal side effects - Ten pills remain after starting the course three days ago  Dietary modifications - Adhering to a liquid diet consisting of clear liquids, applesauce, and protein drinks - Plans to gradually reintroduce solid foods as abdominal pain improves - Inquires about the safety of juicing fruits and vegetables     Health Maintenance Due  Topic Date Due   Zoster Vaccines- Shingrix (1 of 2) 09/24/1971   COVID-19 Vaccine (6 - Pfizer risk 2024-25 season) 08/28/2023    Past Medical History:  Diagnosis Date   Allergy    Cancer (HCC) 2011   melanoma --neck   COVID-19 virus infection 02/25/2022   Essential hypertension 05/20/2018   Hyperlipidemia    Malignant melanoma (HCC) 05/19/2020   stage 1 A, removed by Dr. Dallas sharps and refered to Rockey Hind for wide excision   Melanoma (HCC) 2022    Past Surgical History:  Procedure Laterality Date   HERNIA REPAIR     INGUINAL HERNIA REPAIR N/A 11/09/2021   Procedure: BILATERAL INGUINAL  HERNIA REPAIR WITH MESH;  Surgeon: Curvin Deward MOULD, MD;  Location: [facility] OR;  Service: General;  Laterality: N/A;  GENERAL AND TAP BLOCK   MOHS SURGERY  03/12/2009   MOHS SURGERY Right 07/2020   non-invasive melanoma   TONSILLECTOMY  03/12/1962   TONSILLECTOMY     UMBILICAL HERNIA REPAIR N/A 11/09/2021   Procedure: HERNIA REPAIR UMBILICAL ADULT WITH MESH;  Surgeon: Curvin Deward MOULD, MD;  Location: [facility] OR;  Service: General;  Laterality: N/A;  GENERAL AND TAP BLOCK    Family History  Problem Relation Age of Onset   COPD Mother    Angina Mother     Social History   Socioeconomic History   Marital status: Widowed    Spouse name: Not on file   Number of children: 0   Years of education: Not on file   Highest education level: 12th grade  Occupational History   Not on file  Tobacco Use   Smoking status: Former    Current packs/day: 0.00    Average packs/day: 0.5 packs/day for 20.0 years (10.0 ttl pk-yrs)    Types: Cigarettes    Start date: 06/28/1970    Quit date: 06/28/1990    Years since quitting: 33.2   Smokeless tobacco: Never  Vaping Use   Vaping status: Never Used  Substance and Sexual Activity   Alcohol use: Yes    Alcohol/week: 12.0 standard drinks of alcohol    Types: 12 Cans of beer per week   Drug use: Never   Sexual activity: Yes  Partners: Female  Other Topics Concern   Not on file  Social History Narrative   Wife died September 30, 2014, had cancer   Unemployed    Completed HS   Not working   Has worked in Therapist, 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     Social Drivers of Corporate investment banker Strain: Low Risk  (08/23/2023)   Overall Financial Resource Strain (CARDIA)    Difficulty of Paying Living Expenses: Not very hard  Food Insecurity: Food Insecurity Present (08/23/2023)   Hunger Vital Sign    Worried About Running Out of Food in the Last Year: Sometimes true    Ran Out of Food in the Last Year:  Sometimes true  Transportation Needs: No Transportation Needs (08/23/2023)   PRAPARE - Administrator, Civil Service (Medical): No    Lack of Transportation (Non-Medical): No  Physical Activity: Insufficiently Active (08/23/2023)   Exercise Vital Sign    Days of Exercise per Week: 2 days    Minutes of Exercise per Session: 50 min  Stress: No Stress Concern Present (08/23/2023)   [employer] of Occupational Health - Occupational Stress Questionnaire    Feeling of Stress: Not at all  Social Connections: Moderately Isolated (08/23/2023)   Social Connection and Isolation Panel    Frequency of Communication with Friends and Family: Twice a week    Frequency of Social Gatherings with Friends and Family: Twice a week    Attends Religious Services: 1 to 4 times per year    Active Member of [employer] or Organizations: No    Attends Banker Meetings: Not on file    Marital Status: Widowed  Intimate Partner Violence: Not At Risk (08/15/2023)   Humiliation, Afraid, Rape, and Kick questionnaire    Fear of Current or Ex-Partner: No    Emotionally Abused: No    Physically Abused: No    Sexually Abused: No    Outpatient Medications Prior to Visit  Medication Sig Dispense Refill   amoxicillin -clavulanate (AUGMENTIN ) 875-125 MG tablet Take 1 tablet by mouth 3 (three) times daily for 7 days. 21 tablet 0   Calcium -Magnesium-Zinc 167-83-8 MG TABS Take 1 tablet by mouth daily.     carvedilol  (COREG ) 12.5 MG tablet Take 2 tablets (25 mg total) by mouth 2 (two) times daily with a meal.     Garlic (GARLIQUE PO) Take 1 capsule by mouth daily.     loratadine  (CLARITIN ) 10 MG tablet Take 1 tablet (10 mg total) by mouth daily. 90 tablet 4   Multiple Vitamin (MULTIVITAMIN) tablet Take 1 tablet by mouth daily.     Omega-3 Fatty Acids (FISH OIL ) 1000 MG CPDR 3000 mg by mouth twice daily.     sildenafil  (VIAGRA ) 50 MG tablet 1-2 tabs by mouth once daily as needed for ED 10 tablet 5   vitamin  E 180 MG (400 UNITS) capsule Take 400 Units by mouth daily.     traMADol  (ULTRAM ) 50 MG tablet Take 1 tablet (50 mg total) by mouth every 8 (eight) hours as needed for up to 3 days. 10 tablet 0   No facility-administered medications prior to visit.    Allergies  Allergen Reactions   Advil [Ibuprofen] Other (See Comments)    Upset Stomach   Amlodipine  Rash    ROS See HPI    Objective:    Physical Exam Constitutional:      General:  He is not in acute distress.    Appearance: He is well-developed.  HENT:     Head: Normocephalic and atraumatic.   Cardiovascular:     Rate and Rhythm: Normal rate and regular rhythm.     Heart sounds: No murmur heard. Pulmonary:     Effort: Pulmonary effort is normal. No respiratory distress.     Breath sounds: Normal breath sounds. No wheezing or rales.  Abdominal:     Tenderness: There is abdominal tenderness in the right lower quadrant, suprapubic area and left lower quadrant.     Comments: Abdomen is less tender today than it was last visit   Skin:    General: Skin is warm and dry.   Neurological:     Mental Status: He is alert and oriented to person, place, and time.   Psychiatric:        Behavior: Behavior normal.        Thought Content: Thought content normal.      BP 133/66 (BP Location: Right Arm, Patient Position: Sitting, Cuff Size: Normal)   Pulse 66   Temp 98.7 F (37.1 C) (Oral)   Resp 12   Ht 5' 9 (1.753 m)   Wt 172 lb (78 kg)   SpO2 100%   BMI 25.40 kg/m  Wt Readings from Last 3 Encounters:  09/03/23 172 lb (78 kg)  08/30/23 169 lb (76.7 kg)  08/15/23 172 lb (78 kg)       Assessment & Plan:   Problem List Items Addressed This Visit       Unprioritized   Diverticulitis   Diagnosed via CT scan. Augmentin  adjusted to twice daily as pharmacist didn't want to dispense tid regimen. Symptoms improved, slight soreness remains. On liquid diet for bowel rest. - Continue Augmentin  twice daily for 5 more days. -  Prescribe tramadol  for pain. - Advise continuation of liquid diet, gradually reintroduce solids as symptoms improve. - Report unresolved pain post-antibiotic course for potential extension. - Recommend colonoscopy next time he need colon cancer screening for diverticula assessment.      Relevant Medications   traMADol  (ULTRAM ) 50 MG tablet    I am having Alm Debby Lockwood maintain his multivitamin, Calcium -Magnesium-Zinc, Fish Oil , vitamin E, Garlic (GARLIQUE PO), loratadine , sildenafil , carvedilol , amoxicillin -clavulanate, and traMADol .  Meds ordered this encounter  Medications   DISCONTD: traMADol  (ULTRAM ) 50 MG tablet    Sig: Take 1 tablet (50 mg total) by mouth every 6 (six) hours as needed for up to 3 days.    Dispense:  15 tablet    Refill:  0    Supervising Provider:   DOMENICA BLACKBIRD A [[id number]]   traMADol  (ULTRAM ) 50 MG tablet    Sig: Take 1 tablet (50 mg total) by mouth every 6 (six) hours as needed for up to 3 days.    Dispense:  15 tablet    Refill:  0    Supervising Provider:   DOMENICA BLACKBIRD A [[id number]]

## 2023-09-03 NOTE — Patient Instructions (Signed)
 VISIT SUMMARY:  You visited us  today for a follow-up on your abdominal pain related to diverticulitis. Your pain has improved with Augmentin , but you still have some tenderness. We discussed your current medications, diet, and plans for further evaluation.  YOUR PLAN:  DIVERTICULITIS: You have been diagnosed with diverticulitis, which is an inflammation of small pouches in your colon. -Continue taking Augmentin  twice daily for 5 more days. -Take tramadol  as prescribed for pain management. -Stick to a liquid diet and gradually reintroduce solid foods as your symptoms improve. -If your pain does not resolve after finishing the antibiotics, please let us  know. -We recommend scheduling a colonoscopy after the new year to assess your diverticula.  GENERAL HEALTH MAINTENANCE: We discussed your general health maintenance needs. -You have completed your Cologuard test, which is due again in March. -Plan to have a colonoscopy around October for further assessment of your diverticula.

## 2023-09-03 NOTE — Assessment & Plan Note (Addendum)
 Diagnosed via CT scan. Augmentin  adjusted to twice daily as pharmacist didn't want to dispense tid regimen. Symptoms improved, slight soreness remains. On liquid diet for bowel rest. - Continue Augmentin  twice daily for 5 more days. - Prescribe tramadol  for pain. - Advise continuation of liquid diet, gradually reintroduce solids as symptoms improve. - Report unresolved pain post-antibiotic course for potential extension. - Recommend colonoscopy next time he need colon cancer screening for diverticula assessment.

## 2023-09-04 ENCOUNTER — Encounter: Payer: Self-pay | Admitting: Family

## 2023-09-09 ENCOUNTER — Encounter: Payer: Self-pay | Admitting: Family

## 2023-09-20 DIAGNOSIS — M5417 Radiculopathy, lumbosacral region: Secondary | ICD-10-CM | POA: Diagnosis not present

## 2023-09-20 DIAGNOSIS — M9903 Segmental and somatic dysfunction of lumbar region: Secondary | ICD-10-CM | POA: Diagnosis not present

## 2023-09-20 DIAGNOSIS — M9901 Segmental and somatic dysfunction of cervical region: Secondary | ICD-10-CM | POA: Diagnosis not present

## 2023-09-20 DIAGNOSIS — M62830 Muscle spasm of back: Secondary | ICD-10-CM | POA: Diagnosis not present

## 2023-09-20 DIAGNOSIS — M5413 Radiculopathy, cervicothoracic region: Secondary | ICD-10-CM | POA: Diagnosis not present

## 2023-10-01 ENCOUNTER — Ambulatory Visit: Admitting: Urology

## 2023-10-01 ENCOUNTER — Other Ambulatory Visit (HOSPITAL_BASED_OUTPATIENT_CLINIC_OR_DEPARTMENT_OTHER): Payer: Self-pay

## 2023-10-01 ENCOUNTER — Encounter: Payer: Self-pay | Admitting: Urology

## 2023-10-01 VITALS — BP 140/92 | HR 67 | Ht 70.0 in | Wt 170.0 lb

## 2023-10-01 DIAGNOSIS — F524 Premature ejaculation: Secondary | ICD-10-CM | POA: Insufficient documentation

## 2023-10-01 DIAGNOSIS — Z87898 Personal history of other specified conditions: Secondary | ICD-10-CM | POA: Diagnosis not present

## 2023-10-01 DIAGNOSIS — N529 Male erectile dysfunction, unspecified: Secondary | ICD-10-CM

## 2023-10-01 LAB — MICROSCOPIC EXAMINATION: Bacteria, UA: NONE SEEN

## 2023-10-01 LAB — URINALYSIS, ROUTINE W REFLEX MICROSCOPIC
Bilirubin, UA: NEGATIVE
Glucose, UA: NEGATIVE
Ketones, UA: NEGATIVE
Leukocytes,UA: NEGATIVE
Nitrite, UA: NEGATIVE
Protein,UA: NEGATIVE
Specific Gravity, UA: 1.01 (ref 1.005–1.030)
Urobilinogen, Ur: 0.2 mg/dL (ref 0.2–1.0)
pH, UA: 7 (ref 5.0–7.5)

## 2023-10-01 MED ORDER — PAROXETINE HCL 20 MG PO TABS
ORAL_TABLET | ORAL | 3 refills | Status: AC
  Filled 2023-10-01: qty 15, 15d supply, fill #0
  Filled 2024-01-09: qty 15, 15d supply, fill #1

## 2023-10-01 NOTE — Progress Notes (Signed)
 Assessment: 1. History of elevated PSA   2. Organic impotence   3. Premature ejaculation     Plan: I personally reviewed the patient's chart including provider notes, lab results. I reviewed records from [facility] regarding his prior evaluation. PSA today I had a discussion with the patient regarding ED.  I discussed the pathophysiology, etiology, and natural history of ED as well as management options using a goal-oriented approach.  We discussed the following options: Medical therapy, vacuum erection device, penile injections, intraurethral suppository therapy (MUSE), and penile prosthesis. He would like to continue to try sildenafil  100 mg as needed. I also recommended that he discuss potentially changing his blood pressure medicine to something other than a beta-blocker due to the potential side effect of ED. I discussed the diagnosis and management of premature ejaculation.  Options for management including topical therapy, behavioral therapy, SSRIs discussed. Prescription for Paxil  20 mg 3-4 hours prior to intercourse.  I discussed that this is off label use of this medication. Return to office in 6 weeks.   Chief Complaint:  Chief Complaint  Patient presents with   Elevated PSA    History of Present Illness:  Jose Strong is a 73 y.o. male who is seen in consultation from Daryl Setter, NP for evaluation of elevated PSA, erectile dysfunction, and premature ejaculation  He has a history of elevated PSA and was previously evaluated by Dr. Rosalind at [facility].  He was last seen in May 2024. PSA results: 10/18 3.04 9/20 3.24 9/22 4.44 5/23 2.87 11/23 3.01 5/24 3.33  He reports gradually worsening problems with erectile dysfunction.  He is able to achieve a partial erection with 30-40% rigidity.  No pain or curvature with erection.  He is able to maintain the erection until ejaculation which she reports happens very quickly with the stimulation leading  to detumescence.  No pain with ejaculation.  He has previously tried tadalafil  20 mg as needed and sildenafil  50-100 mg as needed without benefit. He is on carvedilol  for management of his hypertension.  Past Medical History:  Past Medical History:  Diagnosis Date   Allergy    Cancer (HCC) 2011   melanoma --neck   COVID-19 virus infection 02/25/2022   Essential hypertension 05/20/2018   Hyperlipidemia    Malignant melanoma (HCC) 05/19/2020   stage 1 A, removed by Dr. Dallas sharps and refered to Rockey Hind for wide excision   Melanoma [facility]) 2022    Past Surgical History:  Past Surgical History:  Procedure Laterality Date   HERNIA REPAIR     INGUINAL HERNIA REPAIR N/A 11/09/2021   Procedure: BILATERAL INGUINAL HERNIA REPAIR WITH MESH;  Surgeon: Curvin Deward MOULD, MD;  Location: [facility] OR;  Service: General;  Laterality: N/A;  GENERAL AND TAP BLOCK   MOHS SURGERY  03/12/2009   MOHS SURGERY Right 07/2020   non-invasive melanoma   TONSILLECTOMY  03/12/1962   TONSILLECTOMY     UMBILICAL HERNIA REPAIR N/A 11/09/2021   Procedure: HERNIA REPAIR UMBILICAL ADULT WITH MESH;  Surgeon: Curvin Deward MOULD, MD;  Location: [facility] OR;  Service: General;  Laterality: N/A;  GENERAL AND TAP BLOCK    Allergies:  Allergies  Allergen Reactions   Advil [Ibuprofen] Other (See Comments)    Upset Stomach   Amlodipine  Rash    Family History:  Family History  Problem Relation Age of Onset   COPD Mother    Angina Mother     Social History:  Social History   Tobacco  Use   Smoking status: Former    Current packs/day: 0.00    Average packs/day: 0.5 packs/day for 20.0 years (10.0 ttl pk-yrs)    Types: Cigarettes    Start date: 06/28/1970    Quit date: 06/28/1990    Years since quitting: 33.2   Smokeless tobacco: Never  Vaping Use   Vaping status: Never Used  Substance Use Topics   Alcohol use: Yes    Alcohol/week: 12.0 standard drinks of alcohol    Types: 12 Cans of beer per week   Drug use: Never     Review of symptoms:  Constitutional:  Negative for unexplained weight loss, night sweats, fever, chills ENT:  Negative for nose bleeds, sinus pain, painful swallowing CV:  Negative for chest pain, shortness of breath, exercise intolerance, palpitations, loss of consciousness Resp:  Negative for cough, wheezing, shortness of breath GI:  Negative for nausea, vomiting, diarrhea, bloody stools GU:  Positives noted in HPI; otherwise negative for gross hematuria, dysuria, urinary incontinence Neuro:  Negative for seizures, poor balance, limb weakness, slurred speech Psych:  Negative for lack of energy, depression, anxiety Endocrine:  Negative for polydipsia, polyuria, symptoms of hypoglycemia (dizziness, hunger, sweating) Hematologic:  Negative for anemia, purpura, petechia, prolonged or excessive bleeding, use of anticoagulants  Allergic:  Negative for difficulty breathing or choking as a result of exposure to anything; no shellfish allergy; no allergic response (rash/itch) to materials, foods  Physical exam: BP (!) 140/92   Pulse 67   Ht 5' 10 (1.778 m)   Wt 170 lb (77.1 kg)   BMI 24.39 kg/m  GENERAL APPEARANCE:  Well appearing, well developed, well nourished, NAD HEENT: Atraumatic, Normocephalic, oropharynx clear. NECK: Supple without lymphadenopathy or thyromegaly. LUNGS: Clear to auscultation bilaterally. HEART: Regular Rate and Rhythm without murmurs, gallops, or rubs. ABDOMEN: Soft, non-tender, No Masses. EXTREMITIES: Moves all extremities well.  Without clubbing, cyanosis, or edema. NEUROLOGIC:  Alert and oriented x 3, normal gait, CN II-XII grossly intact.  MENTAL STATUS:  Appropriate. BACK:  Non-tender to palpation.  No CVAT SKIN:  Warm, dry and intact.   GU: Penis:  circumcised Meatus: Normal Scrotum: normal, no masses Testis: normal without masses bilateral Prostate: 40 g, NT, no nodules Rectum: Normal tone,  no masses or tenderness   Results: U/A: 0-5 WBCs, 0-2  RBCs

## 2023-10-02 ENCOUNTER — Ambulatory Visit: Payer: Self-pay | Admitting: Urology

## 2023-10-02 LAB — PSA: Prostate Specific Ag, Serum: 5.1 ng/mL — ABNORMAL HIGH (ref 0.0–4.0)

## 2023-11-12 ENCOUNTER — Encounter: Payer: Self-pay | Admitting: Family

## 2023-11-12 DIAGNOSIS — H9190 Unspecified hearing loss, unspecified ear: Secondary | ICD-10-CM

## 2023-11-13 ENCOUNTER — Ambulatory Visit: Admitting: Urology

## 2023-11-13 ENCOUNTER — Encounter: Payer: Self-pay | Admitting: Urology

## 2023-11-13 VITALS — BP 168/111 | HR 80 | Ht 70.0 in | Wt 170.0 lb

## 2023-11-13 DIAGNOSIS — Z87438 Personal history of other diseases of male genital organs: Secondary | ICD-10-CM | POA: Diagnosis not present

## 2023-11-13 DIAGNOSIS — N529 Male erectile dysfunction, unspecified: Secondary | ICD-10-CM | POA: Diagnosis not present

## 2023-11-13 DIAGNOSIS — R972 Elevated prostate specific antigen [PSA]: Secondary | ICD-10-CM

## 2023-11-13 DIAGNOSIS — Z87898 Personal history of other specified conditions: Secondary | ICD-10-CM | POA: Diagnosis not present

## 2023-11-13 DIAGNOSIS — F524 Premature ejaculation: Secondary | ICD-10-CM | POA: Diagnosis not present

## 2023-11-13 LAB — URINALYSIS, ROUTINE W REFLEX MICROSCOPIC
Bilirubin, UA: NEGATIVE
Glucose, UA: NEGATIVE
Ketones, UA: NEGATIVE
Leukocytes,UA: NEGATIVE
Nitrite, UA: NEGATIVE
Protein,UA: NEGATIVE
RBC, UA: NEGATIVE
Specific Gravity, UA: 1.01 (ref 1.005–1.030)
Urobilinogen, Ur: 0.2 mg/dL (ref 0.2–1.0)
pH, UA: 7 (ref 5.0–7.5)

## 2023-11-13 NOTE — Progress Notes (Signed)
 Assessment: 1. History of elevated PSA   2. Organic impotence   3. Premature ejaculation     Plan: Continue sildenafil  100 mg as needed and Paxil  20 mg as needed intercourse. I discussed his PSA elevation and possible causes including prostate cancer.  I discussed further evaluation with additional testing with PSA based test, urine biomarkers, MRI of the prostate, and a prostate biopsy.  He would like to continue to monitor at this time. Return to office in 6 months with previsit PHI  Chief Complaint:  Chief Complaint  Patient presents with   Hx of Elevated PSA    History of Present Illness:  Jose Strong is a 73 y.o. male who is seen for further evaluation of elevated PSA, erectile dysfunction, and premature ejaculation  He has a history of elevated PSA and was previously evaluated by Dr. Rosalind at [facility].  He was last seen in May 2024. PSA results: 10/18 3.04 9/20 3.24 9/22 4.44 5/23 2.87 11/23 3.01 5/24 3.33 7/25 5.1  DRE 7/25:  40 g gland without tenderness or nodularity  He reported gradually worsening problems with erectile dysfunction.  He is able to achieve a partial erection with 30-40% rigidity.  No pain or curvature with erection.  He is able to maintain the erection until ejaculation which he reports happens very quickly with the stimulation leading to detumescence.  No pain with ejaculation.  He has previously tried tadalafil  20 mg as needed and sildenafil  50-100 mg as needed without benefit. He is on carvedilol  for management of his hypertension. He continued to use sildenafil  100 mg as needed.  He was also given a prescription for Paxil  20 mg as needed intercourse for management of his premature ejaculation.  He returns today for follow-up.  He has not had an opportunity to use the sildenafil  or the Paxil .  No change in his urinary symptoms. IPSS = 3/1.  Portions of the above documentation were copied from a prior visit for review purposes  only.   Past Medical History:  Past Medical History:  Diagnosis Date   Allergy    Cancer (HCC) 2011   melanoma --neck   COVID-19 virus infection 02/25/2022   Essential hypertension 05/20/2018   Hyperlipidemia    Malignant melanoma (HCC) 05/19/2020   stage 1 A, removed by Dr. Dallas sharps and refered to Rockey Hind for wide excision   Melanoma [facility]) 2022    Past Surgical History:  Past Surgical History:  Procedure Laterality Date   HERNIA REPAIR     INGUINAL HERNIA REPAIR N/A 11/09/2021   Procedure: BILATERAL INGUINAL HERNIA REPAIR WITH MESH;  Surgeon: Curvin Deward MOULD, MD;  Location: [facility] OR;  Service: General;  Laterality: N/A;  GENERAL AND TAP BLOCK   MOHS SURGERY  03/12/2009   MOHS SURGERY Right 07/2020   non-invasive melanoma   TONSILLECTOMY  03/12/1962   TONSILLECTOMY     UMBILICAL HERNIA REPAIR N/A 11/09/2021   Procedure: HERNIA REPAIR UMBILICAL ADULT WITH MESH;  Surgeon: Curvin Deward MOULD, MD;  Location: [facility] OR;  Service: General;  Laterality: N/A;  GENERAL AND TAP BLOCK    Allergies:  Allergies  Allergen Reactions   Advil [Ibuprofen] Other (See Comments)    Upset Stomach   Amlodipine  Rash    Family History:  Family History  Problem Relation Age of Onset   COPD Mother    Angina Mother     Social History:  Social History   Tobacco Use   Smoking status: Former  Current packs/day: 0.00    Average packs/day: 0.5 packs/day for 20.0 years (10.0 ttl pk-yrs)    Types: Cigarettes    Start date: 06/28/1970    Quit date: 06/28/1990    Years since quitting: 33.4   Smokeless tobacco: Never  Vaping Use   Vaping status: Never Used  Substance Use Topics   Alcohol use: Yes    Alcohol/week: 12.0 standard drinks of alcohol    Types: 12 Cans of beer per week   Drug use: Never    ROS: Constitutional:  Negative for fever, chills, weight loss CV: Negative for chest pain, previous MI, hypertension Respiratory:  Negative for shortness of breath, wheezing, sleep apnea,  frequent cough GI:  Negative for nausea, vomiting, bloody stool, GERD  Physical exam: BP (!) 168/111   Pulse 80   Ht 5' 10 (1.778 m)   Wt 170 lb (77.1 kg)   BMI 24.39 kg/m  GENERAL APPEARANCE:  Well appearing, well developed, well nourished, NAD HEENT:  Atraumatic, normocephalic, oropharynx clear NECK:  Supple without lymphadenopathy or thyromegaly ABDOMEN:  Soft, non-tender, no masses EXTREMITIES:  Moves all extremities well, without clubbing, cyanosis, or edema NEUROLOGIC:  Alert and oriented x 3, normal gait, CN II-XII grossly intact MENTAL STATUS:  appropriate BACK:  Non-tender to palpation, No CVAT SKIN:  Warm, dry, and intact   Results: U/A: Negative

## 2023-12-19 ENCOUNTER — Ambulatory Visit (INDEPENDENT_AMBULATORY_CARE_PROVIDER_SITE_OTHER)

## 2023-12-19 ENCOUNTER — Encounter (INDEPENDENT_AMBULATORY_CARE_PROVIDER_SITE_OTHER): Payer: Self-pay

## 2023-12-19 VITALS — BP 155/88 | HR 88 | Temp 97.6°F | Ht 69.0 in | Wt 170.0 lb

## 2023-12-19 DIAGNOSIS — H90A22 Sensorineural hearing loss, unilateral, left ear, with restricted hearing on the contralateral side: Secondary | ICD-10-CM | POA: Diagnosis not present

## 2023-12-19 DIAGNOSIS — H6123 Impacted cerumen, bilateral: Secondary | ICD-10-CM | POA: Diagnosis not present

## 2023-12-19 DIAGNOSIS — H903 Sensorineural hearing loss, bilateral: Secondary | ICD-10-CM

## 2023-12-19 NOTE — Progress Notes (Signed)
 Dear Dr. Daryl, Here is my assessment for our mutual patient, Jose Strong. Thank you for allowing me the opportunity to care for your patient. Please do not hesitate to contact me should you have any other questions. Sincerely, Dr. Penne Croak  Otolaryngology Clinic Note Referring provider: Dr. Daryl HPI:   [phone] with gradual worsening of hearing loss bilaterally. No loud noise exposure. One severe concussion as a child. No hx of infections. Pain. Does get routine ear cleanings. No vertigo or drainage.   Independent Review of Additional Tests or Records:  Reviewed external note from referring PCP, O'Sullivan,describing  Chief Complaint  Patient presents with   Hearing Loss  . Relevant history incorporated into today's evaluation. I personally reviewed and interpreted audiogram dated March-20/5-20 25.  Left ear mild sensorineural hearing loss upsloping to normal and downsloping to moderate sensorineural hearing loss in the high frequencies.  Right ear mild sensorineural hearing loss downsloping to moderate sensorineural hearing loss in the high frequencies there is a 30 dB gap asymmetry between the right and left ear in the mid frequencies.  Word recognition 84% on the right, 88% on the left.  Right SRT is 45 left  PMH/Meds/All/SocHx/FamHx/ROS:   Past Medical History:  Diagnosis Date   Allergy    Cancer (HCC) 2011   melanoma --neck   COVID-19 virus infection 02/25/2022   Essential hypertension 05/20/2018   Hyperlipidemia    Malignant melanoma (HCC) 05/19/2020   stage 1 A, removed by Dr. Dallas sharps and refered to Rockey Hind for wide excision   Melanoma [facility]) 2022     Past Surgical History:  Procedure Laterality Date   HERNIA REPAIR     INGUINAL HERNIA REPAIR N/A 11/09/2021   Procedure: BILATERAL INGUINAL HERNIA REPAIR WITH MESH;  Surgeon: Curvin Deward MOULD, MD;  Location: [facility] OR;  Service: General;  Laterality: N/A;  GENERAL AND TAP BLOCK   MOHS SURGERY  03/12/2009    MOHS SURGERY Right 07/2020   non-invasive melanoma   TONSILLECTOMY  03/12/1962   TONSILLECTOMY     UMBILICAL HERNIA REPAIR N/A 11/09/2021   Procedure: HERNIA REPAIR UMBILICAL ADULT WITH MESH;  Surgeon: Curvin Deward MOULD, MD;  Location: [facility] OR;  Service: General;  Laterality: N/A;  GENERAL AND TAP BLOCK    Family History  Problem Relation Age of Onset   COPD Mother    Angina Mother      Social Connections: Moderately Isolated (08/23/2023)   Social Connection and Isolation Panel    Frequency of Communication with Friends and Family: Twice a week    Frequency of Social Gatherings with Friends and Family: Twice a week    Attends Religious Services: 1 to 4 times per year    Active Member of [employer] or Organizations: No    Attends Banker Meetings: Not on file    Marital Status: Widowed      Current Outpatient Medications:    Calcium -Magnesium-Zinc 167-83-8 MG TABS, Take 1 tablet by mouth daily., Disp: , Rfl:    carvedilol  (COREG ) 12.5 MG tablet, Take 2 tablets (25 mg total) by mouth 2 (two) times daily with a meal., Disp: , Rfl:    Garlic (GARLIQUE PO), Take 1 capsule by mouth daily., Disp: , Rfl:    loratadine  (CLARITIN ) 10 MG tablet, Take 1 tablet (10 mg total) by mouth daily., Disp: 90 tablet, Rfl: 4   Multiple Vitamin (MULTIVITAMIN) tablet, Take 1 tablet by mouth daily., Disp: , Rfl:    Omega-3 Fatty Acids (FISH OIL )  1000 MG CPDR, 3000 mg by mouth twice daily., Disp: , Rfl:    PARoxetine  (PAXIL ) 20 MG tablet, Take 1 tablet by mouth 3-4 hours prior to intercourse, Disp: 15 tablet, Rfl: 3   sildenafil  (VIAGRA ) 50 MG tablet, 1-2 tabs by mouth once daily as needed for ED, Disp: 10 tablet, Rfl: 5   vitamin E 180 MG (400 UNITS) capsule, Take 400 Units by mouth daily., Disp: , Rfl:    Physical Exam:   BP (!) 155/88 (Cuff Size: Normal)   Pulse 88   Temp 97.6 F (36.4 C) (Oral)   Ht 5' 9 (1.753 m)   Wt 170 lb (77.1 kg)   SpO2 97%   BMI 25.10 kg/m   The patient was awake,  alert, and appropriate. The external ears were inspected, and otoscopy was performed to evaluate the external auditory canals and tympanic membranes. The nasal cavity and septum were examined for mucosal changes, obstruction, or discharge. The oral cavity and oropharynx were inspected for mucosal lesions, infection, or tonsillar hypertrophy. The neck was palpated for lymphadenopathy, thyroid  abnormalities, or other masses. Cranial nerve function was grossly intact.  Pertinent Findings: Tonsils absent.  Cerumen impaction. Dentition with missing/cracked teeth.  Neck benign.   Seprately Identifiable Procedures:  I personally ordered, reviewed and interpreted the following with the patient today  Procedure: Bilateral ear microscopy and cerumen removal using microscope (CPT [id number]) - Mod 25 Pre-procedure diagnosis: Cerumen impaction  external ear/ears Post-procedure diagnosis: same Indication:  cerumen impaction; given patient's otologic complaints and history as well as for improved and comprehensive examination of external ear and tympanic membrane, bilateral otologic examination using microscope was performed and impacted cerumen removed  Procedure: Patient was placed semi-recumbent. Both ear canals were examined using the microscope with findings above. Cerumen removed on left and on right using suction and currette with improvement in EAC examination and patency. Left: EAC was patent. TM was intact . Middle ear was aerated. Drainage: absent Right: EAC was patent. TM was intact . Middle ear was aerated . Drainage: absent Patient tolerated the procedure well.  Impression & Plans:  Jose Strong is a 73 y.o. male  1. Asymmetrical sensorineural hearing loss   2. Bilateral impacted cerumen   3. Sensorineural hearing loss (SNHL) of left ear with restricted hearing of right ear     - Findings and diagnoses discussed in detail with the patient. - Risks, benefits, and alternatives were reviewed.  Through shared decision making, the patient elects to proceed with below.  - MRI recommended to rule out retrocochlear pathology.  - Will call with the results and sign/send medical clearance once results finalized.  - Discussed aural care.  - Orders placed:  Orders Placed This Encounter  Procedures   MR BRAIN/IAC W WO CONTRAST   - Medications prescribed/continued/adjusted: No orders of the defined types were placed in this encounter.  - Education materials provided to the patient. - Follow up: PRN. Patient instructed to return sooner or go to the ED if new/worsening symptoms develop.   Thank you for allowing me the opportunity to care for your patient. Please do not hesitate to contact me should you have any other questions.  Sincerely, Penne Croak, DO Otolaryngologist (ENT) [facility] Phone: [phone] Fax: [phone]  12/19/2023, 2:13 PM

## 2023-12-20 ENCOUNTER — Other Ambulatory Visit (INDEPENDENT_AMBULATORY_CARE_PROVIDER_SITE_OTHER): Payer: Self-pay

## 2023-12-24 ENCOUNTER — Ambulatory Visit (HOSPITAL_BASED_OUTPATIENT_CLINIC_OR_DEPARTMENT_OTHER)
Admission: RE | Admit: 2023-12-24 | Discharge: 2023-12-24 | Disposition: A | Discharge status: Home / Self Care | Source: Ambulatory Visit

## 2023-12-24 DIAGNOSIS — H903 Sensorineural hearing loss, bilateral: Secondary | ICD-10-CM | POA: Diagnosis not present

## 2023-12-24 MED ORDER — GADOBUTROL 1 MMOL/ML IV SOLN
8.0000 mL | Freq: Once | INTRAVENOUS | Status: AC | PRN
  Administered 2023-12-24: 8 mL via INTRAVENOUS

## 2023-12-26 ENCOUNTER — Other Ambulatory Visit: Payer: Self-pay | Admitting: Family

## 2023-12-26 DIAGNOSIS — I10 Essential (primary) hypertension: Secondary | ICD-10-CM

## 2023-12-27 DIAGNOSIS — M9901 Segmental and somatic dysfunction of cervical region: Secondary | ICD-10-CM | POA: Diagnosis not present

## 2023-12-27 DIAGNOSIS — M5417 Radiculopathy, lumbosacral region: Secondary | ICD-10-CM | POA: Diagnosis not present

## 2023-12-27 DIAGNOSIS — M5413 Radiculopathy, cervicothoracic region: Secondary | ICD-10-CM | POA: Diagnosis not present

## 2023-12-27 DIAGNOSIS — M9903 Segmental and somatic dysfunction of lumbar region: Secondary | ICD-10-CM | POA: Diagnosis not present

## 2023-12-27 DIAGNOSIS — M62830 Muscle spasm of back: Secondary | ICD-10-CM | POA: Diagnosis not present

## 2024-01-08 ENCOUNTER — Other Ambulatory Visit (HOSPITAL_BASED_OUTPATIENT_CLINIC_OR_DEPARTMENT_OTHER): Payer: Self-pay

## 2024-01-08 ENCOUNTER — Other Ambulatory Visit: Payer: Self-pay | Admitting: Family

## 2024-01-08 DIAGNOSIS — I10 Essential (primary) hypertension: Secondary | ICD-10-CM

## 2024-01-09 ENCOUNTER — Other Ambulatory Visit (HOSPITAL_BASED_OUTPATIENT_CLINIC_OR_DEPARTMENT_OTHER): Payer: Self-pay

## 2024-01-09 MED ORDER — CARVEDILOL 12.5 MG PO TABS
25.0000 mg | ORAL_TABLET | Freq: Two times a day (BID) | ORAL | 0 refills | Status: DC
  Filled 2024-01-09: qty 360, 90d supply, fill #0

## 2024-01-14 ENCOUNTER — Ambulatory Visit: Admitting: Family

## 2024-01-14 ENCOUNTER — Encounter: Payer: Self-pay | Admitting: Family

## 2024-01-14 VITALS — BP 124/75 | HR 78 | Temp 98.4°F | Resp 16 | Ht 69.0 in | Wt 178.0 lb

## 2024-01-14 DIAGNOSIS — E785 Hyperlipidemia, unspecified: Secondary | ICD-10-CM | POA: Diagnosis not present

## 2024-01-14 DIAGNOSIS — Z0000 Encounter for general adult medical examination without abnormal findings: Secondary | ICD-10-CM | POA: Diagnosis not present

## 2024-01-14 DIAGNOSIS — R972 Elevated prostate specific antigen [PSA]: Secondary | ICD-10-CM | POA: Diagnosis not present

## 2024-01-14 DIAGNOSIS — Z23 Encounter for immunization: Secondary | ICD-10-CM | POA: Diagnosis not present

## 2024-01-14 DIAGNOSIS — I10 Essential (primary) hypertension: Secondary | ICD-10-CM

## 2024-01-14 LAB — COMPREHENSIVE METABOLIC PANEL WITH GFR
ALT: 23 U/L (ref 0–53)
AST: 20 U/L (ref 0–37)
Albumin: 4.3 g/dL (ref 3.5–5.2)
Alkaline Phosphatase: 57 U/L (ref 39–117)
BUN: 13 mg/dL (ref 6–23)
CO2: 30 meq/L (ref 19–32)
Calcium: 9.4 mg/dL (ref 8.4–10.5)
Chloride: 101 meq/L (ref 96–112)
Creatinine, Ser: 0.79 mg/dL (ref 0.40–1.50)
GFR: 89.5 mL/min (ref >60.00)
Glucose, Bld: 101 mg/dL — ABNORMAL HIGH (ref 70–99)
Potassium: 4.2 meq/L (ref 3.5–5.1)
Sodium: 139 meq/L (ref 135–145)
Total Bilirubin: 0.7 mg/dL (ref 0.2–1.2)
Total Protein: 6.9 g/dL (ref 6.0–8.3)

## 2024-01-14 LAB — LIPID PANEL
Cholesterol: 246 mg/dL — ABNORMAL HIGH (ref 0–200)
HDL: 47.5 mg/dL (ref >39.00)
LDL Cholesterol: 148 mg/dL — ABNORMAL HIGH (ref 0–99)
NonHDL: 198.3
Total CHOL/HDL Ratio: 5
Triglycerides: 250 mg/dL — ABNORMAL HIGH (ref 0.0–149.0)
VLDL: 50 mg/dL — ABNORMAL HIGH (ref 0.0–40.0)

## 2024-01-14 MED ORDER — CARVEDILOL 12.5 MG PO TABS: 18.7500 mg | ORAL_TABLET | Freq: Two times a day (BID) | ORAL | Status: AC

## 2024-01-14 NOTE — Assessment & Plan Note (Signed)
 Lab Results  Component Value Date   CHOL 202 (H) 12/19/2022   HDL 49.60 12/19/2022   LDLCALC 136 (H) 12/19/2022   LDLDIRECT 94.0 04/25/2017   TRIG 82.0 12/19/2022   CHOLHDL 4 12/19/2022   Not on statin, update lipid panel.

## 2024-01-14 NOTE — Assessment & Plan Note (Signed)
  Routine wellness visit with no acute concerns. Discussed health maintenance, immunizations, screenings, diet, and exercise. - Administer flu shot. - Send Cologuard test in mid-March. - Check cholesterol and kidney function.

## 2024-01-14 NOTE — Progress Notes (Signed)
 Subjective:     Patient ID: Jose Strong, male    DOB: 29-Dec-1950, 73 y.o.   MRN: [id number]  Chief Complaint  Patient presents with   Annual Exam    HPI  Discussed the use of AI scribe software for clinical note transcription with the patient, who gave verbal consent to proceed.  History of Present Illness  Jose Strong is a 73 year old male who presents for an annual physical exam.  He has experienced difficulty obtaining carvedilol  due to a pharmacy mix-up but now has a full prescription. He takes one and a half tablets daily and experiences dizziness upon standing quickly. He has reduced alcohol consumption to four or five drinks per week, having previously consumed alcohol daily. He aims to gradually return to his walking routine after a period of illness.  He experiences eczema flares, which he associates with receiving the COVID-19 vaccine. His vision has deteriorated, requiring different glasses for reading and driving, and he awaits a new prescription. He has a history of elevated PSA levels and reports that Viagra  is not effective. He was prescribed Paxil  but is unsure about its use. His partner's illness has affected his intimacy.  No cough, cold symptoms, constipation, diarrhea, urinary issues, unusual muscle or joint pain, headaches, depression, or anxiety.  Immunizations:  shingrix due - recommended that he get it at the pharmacy Diet: healthy Exercise: no longer in the habit of walking, plans to restart his routine Colonoscopy: Fecal DNA due 3/26 Vision: due, will schedule Dental:  due, will schedule      Health Maintenance Due  Topic Date Due   Zoster Vaccines- Shingrix (1 of 2) 09/24/1971   Influenza Vaccine  10/11/2023   COVID-19 Vaccine (6 - 2025-26 season) 11/11/2023    Past Medical History:  Diagnosis Date   Allergy    Cancer (HCC) 2011   melanoma --neck   COVID-19 virus infection 02/25/2022   Essential hypertension 05/20/2018    Hyperlipidemia    Malignant melanoma (HCC) 05/19/2020   stage 1 A, removed by Dr. Dallas sharps and refered to Rockey Hind for wide excision   Melanoma (HCC) 2022    Past Surgical History:  Procedure Laterality Date   HERNIA REPAIR     INGUINAL HERNIA REPAIR N/A 11/09/2021   Procedure: BILATERAL INGUINAL HERNIA REPAIR WITH MESH;  Surgeon: Curvin Deward MOULD, MD;  Location: [facility] OR;  Service: General;  Laterality: N/A;  GENERAL AND TAP BLOCK   MOHS SURGERY  03/12/2009   MOHS SURGERY Right 07/2020   non-invasive melanoma   TONSILLECTOMY  03/12/1962   TONSILLECTOMY     UMBILICAL HERNIA REPAIR N/A 11/09/2021   Procedure: HERNIA REPAIR UMBILICAL ADULT WITH MESH;  Surgeon: Curvin Deward MOULD, MD;  Location: [facility] OR;  Service: General;  Laterality: N/A;  GENERAL AND TAP BLOCK    Family History  Problem Relation Age of Onset   COPD Mother    Angina Mother     Social History   Socioeconomic History   Marital status: Widowed    Spouse name: Not on file   Number of children: 0   Years of education: Not on file   Highest education level: Associate degree: academic program  Occupational History   Not on file  Tobacco Use   Smoking status: Former    Current packs/day: 0.00    Average packs/day: 0.5 packs/day for 20.0 years (10.0 ttl pk-yrs)    Types: Cigarettes    Start date: 06/28/1970  Quit date: 06/28/1990    Years since quitting: 33.5   Smokeless tobacco: Never  Vaping Use   Vaping status: Never Used  Substance and Sexual Activity   Alcohol use: Yes    Alcohol/week: 5.0 standard drinks of alcohol    Types: 5 Cans of beer per week   Drug use: Never   Sexual activity: Yes    Partners: Female  Other Topics Concern   Not on file  Social History Narrative   Wife died 02-02-2015, had cancer   Unemployed- retired   Has a girlfriend   Completed HS   Not working   Has worked in therapist, 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      Social Drivers of Corporate Investment Banker Strain: Low Risk  (01/07/2024)   Overall Financial Resource Strain (CARDIA)    Difficulty of Paying Living Expenses: Not very hard  Food Insecurity: Food Insecurity Present (01/07/2024)   Hunger Vital Sign    Worried About Running Out of Food in the Last Year: Sometimes true    Ran Out of Food in the Last Year: Never true  Transportation Needs: No Transportation Needs (01/07/2024)   PRAPARE - Administrator, Civil Service (Medical): No    Lack of Transportation (Non-Medical): No  Physical Activity: Sufficiently Active (01/07/2024)   Exercise Vital Sign    Days of Exercise per Week: 3 days    Minutes of Exercise per Session: 50 min  Stress: No Stress Concern Present (01/07/2024)   [employer] of Occupational Health - Occupational Stress Questionnaire    Feeling of Stress: Not at all  Social Connections: Moderately Isolated (01/07/2024)   Social Connection and Isolation Panel    Frequency of Communication with Friends and Family: Three times a week    Frequency of Social Gatherings with Friends and Family: Twice a week    Attends Religious Services: 1 to 4 times per year    Active Member of [employer] or Organizations: No    Attends Banker Meetings: Not on file    Marital Status: Widowed  Intimate Partner Violence: Not At Risk (08/15/2023)   Humiliation, Afraid, Rape, and Kick questionnaire    Fear of Current or Ex-Partner: No    Emotionally Abused: No    Physically Abused: No    Sexually Abused: No    Outpatient Medications Prior to Visit  Medication Sig Dispense Refill   Calcium -Magnesium-Zinc 167-83-8 MG TABS Take 1 tablet by mouth daily.     Garlic (GARLIQUE PO) Take 1 capsule by mouth daily.     loratadine  (CLARITIN ) 10 MG tablet Take 1 tablet (10 mg total) by mouth daily. 90 tablet 4   Multiple Vitamin (MULTIVITAMIN) tablet Take 1 tablet by mouth daily.     Omega-3 Fatty Acids (FISH OIL ) 1000 MG  CPDR 3000 mg by mouth twice daily.     PARoxetine  (PAXIL ) 20 MG tablet Take 1 tablet by mouth 3-4 hours prior to intercourse 15 tablet 3   sildenafil  (VIAGRA ) 50 MG tablet 1-2 tabs by mouth once daily as needed for ED 10 tablet 5   vitamin E 180 MG (400 UNITS) capsule Take 400 Units by mouth daily.     carvedilol  (COREG ) 12.5 MG tablet Take 2 tablets (25 mg total) by mouth 2 (two) times daily with a meal. 360 tablet 0   carvedilol  (COREG ) 12.5 MG tablet Take 2 tablets (25  mg total) by mouth 2 (two) times daily with a meal. 360 tablet 0   No facility-administered medications prior to visit.    Allergies  Allergen Reactions   Advil [Ibuprofen] Other (See Comments)    Upset Stomach   Amlodipine  Rash    Review of Systems  Constitutional:  Negative for weight loss.  HENT:  Positive for hearing loss. Negative for congestion.   Eyes:  Positive for blurred vision (wears glasses).  Respiratory:  Negative for cough.   Cardiovascular:  Negative for leg swelling.  Gastrointestinal:  Negative for constipation and diarrhea.  Genitourinary:  Negative for dysuria and frequency.  Musculoskeletal:  Negative for joint pain and myalgias.  Skin:  Positive for rash (eczema).  Neurological:  Negative for headaches.  Psychiatric/Behavioral:  Negative for depression. The patient is not nervous/anxious.        Objective:    Physical Exam   BP 124/75 (BP Location: Right Arm, Patient Position: Sitting, Cuff Size: Normal)   Pulse 78   Temp 98.4 F (36.9 C) (Oral)   Resp 16   Ht 5' 9 (1.753 m)   Wt 178 lb (80.7 kg)   SpO2 97%   BMI 26.29 kg/m  Wt Readings from Last 3 Encounters:  01/14/24 178 lb (80.7 kg)  12/19/23 170 lb (77.1 kg)  11/13/23 170 lb (77.1 kg)  Physical Exam  Constitutional: He is oriented to person, place, and time. He appears well-developed and well-nourished. No distress.  HENT:  Head: Normocephalic and atraumatic.  Right Ear: Tympanic membrane and ear canal normal.  Left  Ear: Tympanic membrane and ear canal normal.  Mouth/Throat: Oropharynx is clear and moist.  Eyes: Pupils are equal, round, and reactive to light. No scleral icterus.  Neck: Normal range of motion. No thyromegaly present.  Cardiovascular: Normal rate and regular rhythm.   No murmur heard. Pulmonary/Chest: Effort normal and breath sounds normal. No respiratory distress. He has no wheezes. He has no rales. He exhibits no tenderness.  Abdominal: Soft. Bowel sounds are normal. He exhibits no distension and no mass. There is no tenderness. There is no rebound and no guarding.  Musculoskeletal: He exhibits no edema.  Lymphadenopathy:    He has no cervical adenopathy.  Neurological: He is alert and oriented to person, place, and time. He has normal patellar reflexes. He exhibits normal muscle tone. Coordination normal.  Skin: Skin is warm and dry.  Psychiatric: He has a normal mood and affect. His behavior is normal. Judgment and thought content normal.           Assessment & Plan:        Assessment & Plan:   Problem List Items Addressed This Visit       Unprioritized   Preventative health care    Routine wellness visit with no acute concerns. Discussed health maintenance, immunizations, screenings, diet, and exercise. - Administer flu shot. - Send Cologuard test in mid-March. - Check cholesterol and kidney function.       Hyperlipidemia   Lab Results  Component Value Date   CHOL 202 (H) 12/19/2022   HDL 49.60 12/19/2022   LDLCALC 136 (H) 12/19/2022   LDLDIRECT 94.0 04/25/2017   TRIG 82.0 12/19/2022   CHOLHDL 4 12/19/2022   Not on statin, update lipid panel.       Relevant Medications   carvedilol  (COREG ) 12.5 MG tablet   Other Relevant Orders   Lipid panel   Essential hypertension   BP stable on carvedilol  12.5 mg (1.5  tabs) bid.  Continue same.        Relevant Medications   carvedilol  (COREG ) 12.5 MG tablet   Other Relevant Orders   Comp Met (CMET)    Elevated PSA   Following with Dr. Roseann for PSA surveillance.      Other Visit Diagnoses       Needs flu shot    -  Primary   Relevant Orders   Flu vaccine HIGH DOSE PF(Fluzone Trivalent)       I have discontinued Alm Ned Dave's carvedilol . I have also changed his carvedilol . Additionally, I am having him maintain his multivitamin, Calcium -Magnesium-Zinc, Fish Oil , vitamin E, Garlic (GARLIQUE PO), loratadine , sildenafil , and PARoxetine .  Meds ordered this encounter  Medications   carvedilol  (COREG ) 12.5 MG tablet    Sig: Take 1.5 tablets (18.75 mg total) by mouth 2 (two) times daily with a meal.    Supervising Provider:   DOMENICA BLACKBIRD A [[id number]]

## 2024-01-14 NOTE — Assessment & Plan Note (Addendum)
 BP stable on carvedilol  12.5 mg (1.5 tabs) bid.  Continue same.

## 2024-01-14 NOTE — Patient Instructions (Signed)
 VISIT SUMMARY:  You came in for your annual physical exam. We discussed your medications, vision, hearing, and skin issues, as well as general health maintenance. You received a flu shot and we planned for some follow-up tests and appointments.  YOUR PLAN:  ESSENTIAL HYPERTENSION: Your blood pressure is well-controlled with your current medication, but you experience dizziness when standing up quickly. -Continue taking carvedilol  at 1.5 tablets daily. -Stand up slowly to help reduce dizziness. -We may consider adjusting your medication if the dizziness gets worse.  ELEVATED PROSTATE SPECIFIC ANTIGEN (PSA): Your PSA levels are elevated, and you are being monitored by a urologist. -Continue monitoring your PSA levels with Dr. Bethena Novak, your urologist.  VISION IMPAIRMENT: Your vision has deteriorated, and you need different glasses for reading and driving. -Update your prescription for glasses. -Schedule an eye exam to ensure your vision needs are met.  HEARING LOSS: You have confirmed hearing loss but no structural issues. Financial constraints may delay getting hearing aids. -Consider getting fitted for hearing aids with an audiologist through Medicare.  ECZEMA: You experience eczema flares, possibly related to COVID vaccinations, seasonal changes, and dry skin. -Consider a follow-up with a dermatologist for a skin check.  SHINGLES VACCINATION: You received the Zostavax vaccine in 2015, but Shingrix is recommended for better protection against shingles. -Consider getting the Shingrix vaccination at a pharmacy.  GENERAL HEALTH MAINTENANCE: We discussed routine health maintenance, including immunizations, screenings, diet, and exercise. -You received a flu shot today. -Send in a Cologuard test in mid-March. -We will check your cholesterol and kidney function.

## 2024-01-14 NOTE — Assessment & Plan Note (Signed)
 Following with Dr. Roseann for PSA surveillance.

## 2024-01-16 ENCOUNTER — Ambulatory Visit: Payer: Self-pay | Admitting: Family

## 2024-01-16 DIAGNOSIS — E785 Hyperlipidemia, unspecified: Secondary | ICD-10-CM

## 2024-01-16 NOTE — Telephone Encounter (Signed)
 Please advise pt that his cholesterol is high.  I would recommend that he start atorvastatin  20mg  once daily to decrease risk of heart attack and stroke. Repeat lipid panel in 6 weeks.

## 2024-01-17 NOTE — Progress Notes (Signed)
 Called patient but no answer, left voice mail for patient to call back.

## 2024-01-21 DIAGNOSIS — M62830 Muscle spasm of back: Secondary | ICD-10-CM | POA: Diagnosis not present

## 2024-01-21 DIAGNOSIS — M5413 Radiculopathy, cervicothoracic region: Secondary | ICD-10-CM | POA: Diagnosis not present

## 2024-01-21 DIAGNOSIS — M9901 Segmental and somatic dysfunction of cervical region: Secondary | ICD-10-CM | POA: Diagnosis not present

## 2024-01-21 DIAGNOSIS — M9903 Segmental and somatic dysfunction of lumbar region: Secondary | ICD-10-CM | POA: Diagnosis not present

## 2024-01-21 DIAGNOSIS — M5417 Radiculopathy, lumbosacral region: Secondary | ICD-10-CM | POA: Diagnosis not present

## 2024-02-03 ENCOUNTER — Telehealth (INDEPENDENT_AMBULATORY_CARE_PROVIDER_SITE_OTHER): Payer: Self-pay

## 2024-02-03 NOTE — Telephone Encounter (Signed)
 Called patient to go over MRI results. Patient did not answer I left a voicemail letting them know that we had their MRI results and to give us  a call back and I left the call back number.

## 2024-02-04 ENCOUNTER — Telehealth (INDEPENDENT_AMBULATORY_CARE_PROVIDER_SITE_OTHER): Payer: Self-pay

## 2024-02-04 NOTE — Telephone Encounter (Signed)
 Called patient and patient answered. I let the patient know that their MRI came back normal everything looked fine but that Dr. Anice would like them to come in annually to get a hearing test done to keep track of his hearing. Patient understood.

## 2024-02-05 NOTE — Progress Notes (Addendum)
 Jose Strong                                          MRN: [id number]   02/05/2024   The [facility] Quality Team Specialist reviewed this patient medical record for the purposes of chart review for care gap closure. The following were reviewed: abstraction for care gap closure-controlling blood pressure.  03/24/2024- NO NEW CBP TO CLOSE    [facility] Quality Team

## 2024-02-07 ENCOUNTER — Other Ambulatory Visit (HOSPITAL_BASED_OUTPATIENT_CLINIC_OR_DEPARTMENT_OTHER): Payer: Self-pay

## 2024-02-28 DIAGNOSIS — M62830 Muscle spasm of back: Secondary | ICD-10-CM | POA: Diagnosis not present

## 2024-02-28 DIAGNOSIS — M9901 Segmental and somatic dysfunction of cervical region: Secondary | ICD-10-CM | POA: Diagnosis not present

## 2024-02-28 DIAGNOSIS — M5417 Radiculopathy, lumbosacral region: Secondary | ICD-10-CM | POA: Diagnosis not present

## 2024-02-28 DIAGNOSIS — M5413 Radiculopathy, cervicothoracic region: Secondary | ICD-10-CM | POA: Diagnosis not present

## 2024-02-28 DIAGNOSIS — M9903 Segmental and somatic dysfunction of lumbar region: Secondary | ICD-10-CM | POA: Diagnosis not present

## 2024-05-04 ENCOUNTER — Other Ambulatory Visit

## 2024-05-12 ENCOUNTER — Ambulatory Visit: Admitting: Urology

## 2024-07-14 ENCOUNTER — Ambulatory Visit: Admitting: Family

## 2024-08-20 ENCOUNTER — Ambulatory Visit
